# Patient Record
Sex: Male | Born: 1955
Health system: Southern US, Community
[De-identification: ages and names within clinical notes are randomized; demographics above are authoritative.]

## PROBLEM LIST (undated history)

## (undated) DIAGNOSIS — L719 Rosacea, unspecified: Secondary | ICD-10-CM

## (undated) DIAGNOSIS — I10 Essential (primary) hypertension: Secondary | ICD-10-CM

## (undated) DIAGNOSIS — N201 Calculus of ureter: Secondary | ICD-10-CM

## (undated) DIAGNOSIS — N200 Calculus of kidney: Secondary | ICD-10-CM

## (undated) DIAGNOSIS — Z87448 Personal history of other diseases of urinary system: Secondary | ICD-10-CM

## (undated) DIAGNOSIS — Z87442 Personal history of urinary calculi: Secondary | ICD-10-CM

## (undated) DIAGNOSIS — K219 Gastro-esophageal reflux disease without esophagitis: Secondary | ICD-10-CM

## (undated) DIAGNOSIS — L659 Nonscarring hair loss, unspecified: Secondary | ICD-10-CM

## (undated) DIAGNOSIS — R319 Hematuria, unspecified: Secondary | ICD-10-CM

## (undated) DIAGNOSIS — C61 Malignant neoplasm of prostate: Secondary | ICD-10-CM

## (undated) DIAGNOSIS — M1990 Unspecified osteoarthritis, unspecified site: Secondary | ICD-10-CM

## (undated) HISTORY — PX: JOINT REPLACEMENT: SHX530

## (undated) HISTORY — PX: COLONOSCOPY: SHX174

---
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
## 1993-06-25 HISTORY — PX: KNEE ARTHROSCOPY: SHX127

## 2000-03-04 ENCOUNTER — Encounter (INDEPENDENT_AMBULATORY_CARE_PROVIDER_SITE_OTHER): Payer: Self-pay | Admitting: Specialist

## 2000-03-04 ENCOUNTER — Ambulatory Visit (HOSPITAL_BASED_OUTPATIENT_CLINIC_OR_DEPARTMENT_OTHER): Admission: RE | Admit: 2000-03-04 | Discharge: 2000-03-04 | Payer: Self-pay | Admitting: Plastic Surgery

## 2000-03-04 HISTORY — PX: LIPOMA EXCISION: SHX5283

## 2000-10-21 ENCOUNTER — Encounter: Payer: Self-pay | Admitting: Internal Medicine

## 2000-10-21 ENCOUNTER — Encounter: Admission: RE | Admit: 2000-10-21 | Discharge: 2000-10-21 | Payer: Self-pay | Admitting: Internal Medicine

## 2000-11-15 ENCOUNTER — Encounter: Payer: Self-pay | Admitting: Internal Medicine

## 2000-11-15 ENCOUNTER — Ambulatory Visit (HOSPITAL_COMMUNITY): Admission: RE | Admit: 2000-11-15 | Discharge: 2000-11-15 | Payer: Self-pay | Admitting: Internal Medicine

## 2000-11-26 ENCOUNTER — Encounter (HOSPITAL_COMMUNITY): Admission: RE | Admit: 2000-11-26 | Discharge: 2001-01-22 | Payer: Self-pay | Admitting: Internal Medicine

## 2000-12-13 ENCOUNTER — Ambulatory Visit (HOSPITAL_COMMUNITY): Admission: RE | Admit: 2000-12-13 | Discharge: 2000-12-13 | Payer: Self-pay | Admitting: Internal Medicine

## 2000-12-13 ENCOUNTER — Encounter (INDEPENDENT_AMBULATORY_CARE_PROVIDER_SITE_OTHER): Payer: Self-pay | Admitting: *Deleted

## 2000-12-13 ENCOUNTER — Encounter: Payer: Self-pay | Admitting: Internal Medicine

## 2001-01-17 ENCOUNTER — Ambulatory Visit (HOSPITAL_COMMUNITY): Admission: RE | Admit: 2001-01-17 | Discharge: 2001-01-17 | Payer: Self-pay | Admitting: Internal Medicine

## 2001-01-17 ENCOUNTER — Encounter (INDEPENDENT_AMBULATORY_CARE_PROVIDER_SITE_OTHER): Payer: Self-pay | Admitting: Specialist

## 2001-01-17 ENCOUNTER — Encounter: Payer: Self-pay | Admitting: Internal Medicine

## 2001-01-24 ENCOUNTER — Encounter (HOSPITAL_COMMUNITY): Admission: RE | Admit: 2001-01-24 | Discharge: 2001-04-24 | Payer: Self-pay | Admitting: Internal Medicine

## 2001-04-25 ENCOUNTER — Encounter (HOSPITAL_COMMUNITY): Admission: RE | Admit: 2001-04-25 | Discharge: 2001-07-24 | Payer: Self-pay | Admitting: Internal Medicine

## 2001-08-22 ENCOUNTER — Encounter (HOSPITAL_COMMUNITY): Admission: RE | Admit: 2001-08-22 | Discharge: 2001-11-20 | Payer: Self-pay | Admitting: Internal Medicine

## 2001-08-29 ENCOUNTER — Encounter: Payer: Self-pay | Admitting: Internal Medicine

## 2001-08-29 ENCOUNTER — Ambulatory Visit (HOSPITAL_COMMUNITY): Admission: RE | Admit: 2001-08-29 | Discharge: 2001-08-29 | Payer: Self-pay | Admitting: Internal Medicine

## 2003-02-26 ENCOUNTER — Encounter (HOSPITAL_COMMUNITY): Admission: RE | Admit: 2003-02-26 | Discharge: 2003-05-27 | Payer: Self-pay | Admitting: Internal Medicine

## 2003-06-21 ENCOUNTER — Encounter (HOSPITAL_COMMUNITY): Admission: RE | Admit: 2003-06-21 | Discharge: 2003-09-19 | Payer: Self-pay | Admitting: Internal Medicine

## 2003-09-24 ENCOUNTER — Encounter (HOSPITAL_COMMUNITY): Admission: RE | Admit: 2003-09-24 | Discharge: 2003-12-23 | Payer: Self-pay | Admitting: Internal Medicine

## 2004-04-21 ENCOUNTER — Encounter (HOSPITAL_COMMUNITY): Admission: RE | Admit: 2004-04-21 | Discharge: 2004-07-20 | Payer: Self-pay | Admitting: Nephrology

## 2005-01-03 ENCOUNTER — Encounter: Admission: RE | Admit: 2005-01-03 | Discharge: 2005-01-03 | Payer: Self-pay | Admitting: Internal Medicine

## 2005-01-14 ENCOUNTER — Emergency Department (HOSPITAL_COMMUNITY): Admission: EM | Admit: 2005-01-14 | Discharge: 2005-01-14 | Payer: Self-pay | Admitting: Emergency Medicine

## 2005-06-28 ENCOUNTER — Encounter (HOSPITAL_COMMUNITY): Admission: RE | Admit: 2005-06-28 | Discharge: 2005-09-26 | Payer: Self-pay | Admitting: Internal Medicine

## 2005-08-02 ENCOUNTER — Ambulatory Visit: Payer: Self-pay | Admitting: Pulmonary Disease

## 2005-09-25 ENCOUNTER — Ambulatory Visit: Payer: Self-pay | Admitting: Pulmonary Disease

## 2005-10-03 ENCOUNTER — Encounter (HOSPITAL_COMMUNITY): Admission: RE | Admit: 2005-10-03 | Discharge: 2006-01-01 | Payer: Self-pay | Admitting: Internal Medicine

## 2006-02-28 ENCOUNTER — Encounter (HOSPITAL_COMMUNITY): Admission: RE | Admit: 2006-02-28 | Discharge: 2006-05-29 | Payer: Self-pay | Admitting: Internal Medicine

## 2006-06-05 ENCOUNTER — Encounter (HOSPITAL_COMMUNITY): Admission: RE | Admit: 2006-06-05 | Discharge: 2006-06-05 | Payer: Self-pay | Admitting: Internal Medicine

## 2006-10-11 ENCOUNTER — Encounter (HOSPITAL_COMMUNITY): Admission: RE | Admit: 2006-10-11 | Discharge: 2006-12-26 | Payer: Self-pay | Admitting: Internal Medicine

## 2007-02-14 ENCOUNTER — Encounter (HOSPITAL_COMMUNITY): Admission: RE | Admit: 2007-02-14 | Discharge: 2007-04-30 | Payer: Self-pay | Admitting: Internal Medicine

## 2007-08-11 ENCOUNTER — Encounter (HOSPITAL_COMMUNITY): Admission: RE | Admit: 2007-08-11 | Discharge: 2007-11-09 | Payer: Self-pay | Admitting: Internal Medicine

## 2008-02-10 ENCOUNTER — Ambulatory Visit (HOSPITAL_COMMUNITY): Admission: RE | Admit: 2008-02-10 | Discharge: 2008-02-10 | Payer: Self-pay | Admitting: Urology

## 2008-03-17 ENCOUNTER — Encounter (INDEPENDENT_AMBULATORY_CARE_PROVIDER_SITE_OTHER): Payer: Self-pay | Admitting: Urology

## 2008-03-17 ENCOUNTER — Inpatient Hospital Stay (HOSPITAL_COMMUNITY): Admission: RE | Admit: 2008-03-17 | Discharge: 2008-03-18 | Payer: Self-pay | Admitting: Urology

## 2008-03-17 HISTORY — PX: ROBOT ASSISTED LAPAROSCOPIC RADICAL PROSTATECTOMY: SHX5141

## 2008-03-19 ENCOUNTER — Inpatient Hospital Stay (HOSPITAL_COMMUNITY): Admission: EM | Admit: 2008-03-19 | Discharge: 2008-03-22 | Payer: Self-pay | Admitting: Urology

## 2008-04-22 ENCOUNTER — Encounter (HOSPITAL_COMMUNITY): Admission: RE | Admit: 2008-04-22 | Discharge: 2008-06-24 | Payer: Self-pay | Admitting: Internal Medicine

## 2008-08-27 ENCOUNTER — Encounter (HOSPITAL_COMMUNITY): Admission: RE | Admit: 2008-08-27 | Discharge: 2008-11-25 | Payer: Self-pay | Admitting: Internal Medicine

## 2009-03-26 ENCOUNTER — Ambulatory Visit (HOSPITAL_COMMUNITY): Admission: EM | Admit: 2009-03-26 | Discharge: 2009-03-26 | Payer: Self-pay | Admitting: Emergency Medicine

## 2009-03-26 HISTORY — PX: COMPLEX WOUND CLOSURE: SHX6446

## 2009-10-05 ENCOUNTER — Ambulatory Visit (HOSPITAL_BASED_OUTPATIENT_CLINIC_OR_DEPARTMENT_OTHER): Admission: RE | Admit: 2009-10-05 | Discharge: 2009-10-05 | Payer: Self-pay | Admitting: Urology

## 2009-10-05 HISTORY — PX: OTHER SURGICAL HISTORY: SHX169

## 2009-11-11 ENCOUNTER — Ambulatory Visit (HOSPITAL_COMMUNITY): Admission: RE | Admit: 2009-11-11 | Discharge: 2009-11-11 | Payer: Self-pay | Admitting: Internal Medicine

## 2010-09-13 LAB — POCT I-STAT, CHEM 8
Creatinine, Ser: 0.9 mg/dL (ref 0.4–1.5)
Glucose, Bld: 101 mg/dL — ABNORMAL HIGH (ref 70–99)
HCT: 46 % (ref 39.0–52.0)
Hemoglobin: 15.6 g/dL (ref 13.0–17.0)
Sodium: 141 mEq/L (ref 135–145)
TCO2: 29 mmol/L (ref 0–100)

## 2010-09-28 LAB — POCT I-STAT, CHEM 8
BUN: 23 mg/dL (ref 6–23)
Calcium, Ion: 1.07 mmol/L — ABNORMAL LOW (ref 1.12–1.32)
Hemoglobin: 15 g/dL (ref 13.0–17.0)
Sodium: 140 mEq/L (ref 135–145)

## 2010-10-05 LAB — FERRITIN: Ferritin: 9 ng/mL — ABNORMAL LOW (ref 22–322)

## 2010-11-07 NOTE — Discharge Summary (Signed)
NAME:  Randall Bradley, Randall Bradley NO.:  [id number]   MEDICAL RECORD NO.:  [id number]          PATIENT TYPE:  INP   LOCATION:  1336                         FACILITY:  [facility]   PHYSICIAN:  Heloise Purpura, MD      DATE OF BIRTH:  Aug 19, 1955   DATE OF ADMISSION:  03/19/2008  DATE OF DISCHARGE:  03/22/2008                               DISCHARGE SUMMARY   ADMISSION DIAGNOSES:  1. Prostate cancer status post robotic prostatectomy.  2. Abdominal pain with nausea and vomiting suggestive of a      postoperative ileus.   DISCHARGE DIAGNOSES:  1. Prostate cancer status post robotic prostatectomy.  2. Postoperative ileus.   HISTORY AND PHYSICAL:  For full details, please see admission history  and physical.  Briefly, Randall Bradley is a 55 year old gentleman who  underwent a robotic prostatectomy on March 17, 2008.  He recovered  well over the next 24 hours and was able to be discharged home on  postoperative day #1 after tolerating a clear liquid diet without  difficulty.  He subsequently developed severe nausea and vomiting over  the next 48 hours and became significantly dehydrated.  He was evaluated  in our office and appeared to have abdominal distention and tenderness  and findings consistent most likely with postoperative ileus.  It was  therefore decided to admit him to the hospital for further evaluation  and for IV fluid hydration.   HOSPITAL COURSE:  On March 19, 2008, the patient was admitted to the  hospital and placed on IV fluid hydration.  No acute abdominal series  revealed severe distention of the small and large intestine with air  seen down into the rectum suggestive of a pattern consistent with an  ileus.  No free air was present to suggest bowel injury, nor were  findings suggestive of a mal obstruction.  The patient was managed with  a nasogastric tube which significantly relieved his discomfort.  He was  then monitored overnight and did have increased  urine output and felt  much improved the following day.  His nasogastric tube was clamped and  an abdominal film was obtained which demonstrated decreased distention  of the small intestine and colon.  His nasogastric tube was then  removed.  He remained n.p.o. that day and the following day was begun on  a clear liquid diet as he did have return of bowel function and began  passing flatus and having multiple bowel movements.  On March 22, 2008, he was advanced to a regular diet which he tolerated without  difficulty.  He continued to have bowel function without nausea or  vomiting and was felt stable to be discharged home.   DISPOSITION:  Home.   DISCHARGE MEDICATIONS:  He was instructed to resume his regular home  medications including Vicodin as needed for pain and was instructed to  take Colace as a stool softener.   DISCHARGE INSTRUCTIONS:  He was instructed to be ambulatory but  specifically told to refrain from any heavy lifting, strenuous activity,  or driving.  He will continue to be managed with  an indwelling Foley  catheter and again was instructed on proper use.   FOLLOW UP:  He will follow-up in 2 days for further evaluation and  removal of his Foley catheter.   PATHOLOGY:  His surgical pathology did return during his hospitalization  and demonstrated a [id number] Gleason 3+4=7 adenocarcinoma with negative  surgical margins.  This pathology report and its implications were  discussed with the patient during his hospitalization.      Heloise Purpura, MD  Electronically Signed     LB/MEDQ  D:  03/22/2008  T:  03/23/2008  Job:  [id number]

## 2010-11-07 NOTE — Op Note (Signed)
NAME:  Randall Bradley, Randall Bradley NO.:  [id number]   MEDICAL RECORD NO.:  [id number]          PATIENT TYPE:  INP   LOCATION:  1403                         FACILITY:  [facility]   PHYSICIAN:  Heloise Purpura, MD      DATE OF BIRTH:  1955-08-29   DATE OF PROCEDURE:  03/17/2008  DATE OF DISCHARGE:                               OPERATIVE REPORT   PREOPERATIVE DIAGNOSIS:  Clinically localized adenocarcinoma of the  prostate (clinical stage T1C N0 M0).   POSTOPERATIVE DIAGNOSIS:  Clinically localized adenocarcinoma of the  prostate (clinical stage T1C N0 M0).   PROCEDURES:  1. Robotic assisted laparoscopic radical prostatectomy (non nerve      sparing).  2. Bilateral extended laparoscopic pelvic lymphadenectomy.   SURGEON:  Heloise Purpura, MD   ASSISTANT:  Delia Chimes, NP   ANESTHESIA:  General.   COMPLICATIONS:  None.   ESTIMATED BLOOD LOSS:  200 mL.   INTRAVENOUS FLUIDS:  2200 mL of lactated Ringer's.   SPECIMENS:  1. Prostate seminal vesicles.  2. Right pelvic lymph nodes.  3. Left pelvic lymph nodes.   DISPOSITION OF SPECIMENS:  To pathology.   DRAINS:  1. A 20 French coude catheter.  2. A #19 Blake pelvic drain.   INDICATIONS:  Mr. Kossman is a 55 year old gentleman with clinically  localized adenocarcinoma of the prostate.  He was found to have high-  risk disease and after discussion regarding various management options  for treatment he elected to proceed with surgical therapy as primary  treatment.  The potential risks, complications and alternative treatment  options were discussed in detail and informed consent was obtained.   DESCRIPTION OF PROCEDURE:  The patient was taken to the operating room  and a general anesthetic was administered.  He was given preoperative  antibiotics, placed in the dorsal lithotomy position, prepped and draped  in the usual sterile fashion.  Next a preoperative time-out was  performed.  A site was selected just superior to  the umbilicus after a  Foley catheter was placed.  This was used to place the camera port after  a small incision was made in the midline allowing entry into the  peritoneal cavity utilizing an open Hasson approach.  A 12 mm port was  then placed and a pneumoperitoneum was established.  A zero degree lens  was used to inspect the abdomen and there was no evidence for any intra-  abdominal injuries or other abnormalities.  The remaining ports were  then placed.  Bilateral 8 mm robotic ports were placed 10 cm lateral to  and just inferior to the camera port site.  An additional 8 mm port was  placed in the far left lateral abdominal wall.  A 5 mm port was placed  between the camera port and the right robotic port.  An additional 12 mm  port was placed in the far right lateral abdominal wall for laparoscopic  assistance.  All ports were placed under direct vision and without  difficulty.  The surgical cart was then docked.  With the aid of the  cautery scissors the bladder  was reflected posteriorly allowing entry  into space of Retzius and identification of the endopelvic fascia and  prostate.  The endopelvic fascia was then incised from the apex back to  the base of the prostate bilaterally and the underlying levator muscle  fibers were swept laterally off the prostate thereby isolating the  dorsal venous complex.  The dorsal venous complex was then stapled and  divided with a 45 mm flex ETS stapler.  Attention then turned to the  bladder neck which was identified with the aid of Foley catheter  manipulation.  The bladder neck was divided anteriorly allowing exposure  of the Foley catheter.  The catheter balloon was deflated and the  catheter was brought into the operative field and used to retract the  prostate anteriorly.  This exposed the posterior bladder neck which was  then divided and dissection proceeded between the bladder and prostate  until the vasa deferentia and seminal vesicles  were identified.  The  vasa deferentia were isolated, divided and lifted anteriorly.  The  seminal vesicles were then dissected down to their tips with care to  control the seminal vesicle arterial blood supply.  The seminal vesicles  and vasa deferentia were then lifted anteriorly in the space between  Denonvilliers fascia and the anterior rectum was bluntly developed  thereby isolating the vascular pedicles of the prostate.  Based on the  patient's high-risk disease, it was decided to proceed with a wide non  nerve sparing dissection of the pedicles of the prostate.  Hem-o-lok  clips were used for hemostasis and the pedicles were divided with  scissors dissection.  The urethra was then sharply divided allowing the  prostate specimen to be disarticulated and the pelvis was copiously  irrigated.  Hemostasis appeared excellent and with irrigation in the  pelvis, air was injected into the rectal catheter and there was no  evidence for a rectal injury.  Attention then turned to the right pelvic  sidewall.   Based on the patient's high-risk disease, it was decided to proceed with  an extended lymphadenectomy.  The fibrofatty tissue extending from just  lateral to the external iliac artery to a point just above the  confluence of the iliac vessels, posteriorly to the hypogastric artery  and distally to Cooper's ligament were dissected free off the pelvic  sidewall with Hem-o-lok clips used for lymphostasis and hemostasis.  Care was taken to preserve the obturator nerve.  An identical procedure  was then performed on the contralateral side.  Each lymph node packet  was passed off for permanent pathologic analysis.  No grossly abnormal  lymph nodes were identified.  Attention then returned to the pelvis.  A  2-0 Vicryl slip-knot was placed between Denonvilliers fascia, the  posterior bladder neck and posterior urethra to reapproximate these  structures.  A double-armed 3-0 Monocryl suture was  then used to perform  a 360 degree running tension-free anastomosis between the bladder neck  and urethra.  A new 20 [place] coude catheter was inserted into the  bladder and irrigated.  The anastomosis appeared to be watertight and  there were no blood clots within the bladder.  A #19 Blake drain was  then brought through the left robotic port and appropriately positioned  in the pelvis.  It was secured to the skin with a nylon suture.  The  surgical cart was then undocked.  The right lateral 12 mm port site was  closed with zero Vicryl suture with the aid of  the [employer]  needle.  All remaining ports were then removed under direct vision and  the prostate specimen was removed intact within the Endopouch retrieval  bag via the periumbilical port site.  This port site was then closed at  the fascial level with a running zero Vicryl suture.  Quarter percent  Marcaine was then used to inject all port sites which were  reapproximated at the skin level with staples.  Sterile dressings were  applied.  The patient appeared to tolerate the procedure well without  complications.  He was able to be extubated and transferred to the  recovery unit in satisfactory condition.      Heloise Purpura, MD  Electronically Signed     LB/MEDQ  D:  03/17/2008  T:  03/18/2008  Job:  [id number]

## 2010-11-10 NOTE — Op Note (Signed)
[place]  Patient:    Randall Bradley, Randall Bradley                        MRN: [id number] Proc. Date: 03/04/00 Adm. Date:  [id number] Attending:  Loura Halt Ii                           Operative Report  PREOPERATIVE DIAGNOSIS:  1.5 cm right lower forehead sebaceous cyst.  POSTOPERATIVE DIAGNOSIS:  1.5 cm right lower forehead lipoma.  OPERATION PERFORMED:  Excision of subcutaneous mass, right lower forehead (lipoma removed).  SURGEON:  Alfredia Ferguson, M.D.  ANESTHESIA:  2% lidocaine with 1:100,000 epinephrine.  INDICATIONS FOR PROCEDURE:  The patient is a 55 year old male with a prominent forehead mass located in a subcutaneous position approximately 2 cm above the medial right eyebrow.  The patient wishes to have this area removed.  He states that periodically it gets larger and then seems to get smaller.  He understands that he will be trading what he has for a permanent potentially unsightly scar.  There is always a risk of a contour deformity.  In spite of these risks, the patient wishes to proceed with the operation.  DESCRIPTION OF PROCEDURE:  Dimensions of the lesion were marked on the skin and an elliptical skin marker was placed in the planned location of the incision.  Local anesthesia was infiltrated in a field block.  The patients forehead was prepped and draped in sterile fashion. After waiting approximately 10 minutes, an elliptical skin incision was made approximately 2 mm wide.  The ellipse of skin was removed.  The dissection was deepened into the subcutaneous space and no sebaceous cyst was discovered.  The mass was felt to be beneath the frontalis muscle.  The frontalis muscle was opened and just beneath the frontalis muscle, a 1.5 cm lipoma was visualized.  This was densely adherent to the periosteum of the frontal bone.  Using a combination of sharp dissection and electrocautery dissection, the lipoma was removed.  It was  removed in whole.  Specimen was passed off for pathology.  Hemostasis was accomplished using electrocautery.  The wound was closed by reapproximating the frontalis muscle using interrupted 4-0 Vicryl suture.  The dermis was closed with a similar suture.  The skin was united using a running 6-0 nylon suture.  A light dressing was applied and the patient was discharged home in satisfactory condition. DD:  03/04/00 TD:  03/04/00 Job: [id number]
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
## 2011-01-10 ENCOUNTER — Ambulatory Visit (HOSPITAL_COMMUNITY): Payer: BC Managed Care – PPO | Attending: Internal Medicine

## 2011-02-07 ENCOUNTER — Ambulatory Visit (HOSPITAL_COMMUNITY): Payer: BC Managed Care – PPO | Attending: Internal Medicine

## 2011-02-07 ENCOUNTER — Other Ambulatory Visit: Payer: Self-pay | Admitting: Internal Medicine

## 2011-02-08 LAB — POCT HEMOGLOBIN-HEMACUE: Hemoglobin: 14.5 g/dL (ref 13.0–17.0)

## 2011-03-07 ENCOUNTER — Encounter (HOSPITAL_COMMUNITY): Payer: BC Managed Care – PPO | Attending: Internal Medicine

## 2011-03-07 ENCOUNTER — Other Ambulatory Visit: Payer: Self-pay | Admitting: Internal Medicine

## 2011-03-07 LAB — FERRITIN: Ferritin: 20 ng/mL — ABNORMAL LOW (ref 22–322)

## 2011-03-16 LAB — FERRITIN: Ferritin: 17 — ABNORMAL LOW (ref 22–322)

## 2011-03-21 LAB — FERRITIN: Ferritin: 13 — ABNORMAL LOW (ref 22–322)

## 2011-03-21 LAB — HEMOGLOBIN AND HEMATOCRIT, BLOOD: Hemoglobin: 13.8

## 2011-03-26 LAB — HEMOGLOBIN AND HEMATOCRIT, BLOOD
HCT: 38.8 — ABNORMAL LOW
Hemoglobin: 11.1 — ABNORMAL LOW
Hemoglobin: 12.5 — ABNORMAL LOW

## 2011-03-26 LAB — BASIC METABOLIC PANEL
BUN: 12
CO2: 28
Calcium: 8.1 — ABNORMAL LOW
Calcium: 8.5
Calcium: 9
Chloride: 102
Chloride: 103
Chloride: 106
Creatinine, Ser: 0.94
Creatinine, Ser: 0.95
Creatinine, Ser: 1.03
GFR calc non Af Amer: >60
GFR calc non Af Amer: >60
GFR calc non Af Amer: >60
Glucose, Bld: 111 — ABNORMAL HIGH
Glucose, Bld: 143 — ABNORMAL HIGH
Potassium: 3.7
Potassium: 3.9
Sodium: 138
Sodium: 140

## 2011-03-26 LAB — CBC
HCT: 33.3 — ABNORMAL LOW
HCT: 36.6 — ABNORMAL LOW
Hemoglobin: 10.9 — ABNORMAL LOW
Hemoglobin: 13.4
MCHC: 32.4
MCHC: 32.4
MCHC: 32.8
MCV: 86.4
Platelets: 162
RBC: 3.85 — ABNORMAL LOW
RBC: 4.23
RBC: 4.76
RDW: 14.1
RDW: 14.4
WBC: 11.3 — ABNORMAL HIGH
WBC: 5
WBC: 6.8

## 2011-03-26 LAB — DIFFERENTIAL
Basophils Relative: 0
Basophils Relative: 0
Eosinophils Absolute: 0
Eosinophils Relative: 0
Lymphocytes Relative: 19
Lymphs Abs: 0.5 — ABNORMAL LOW
Lymphs Abs: 1.3
Monocytes Relative: 8
Monocytes Relative: 9
Neutro Abs: 4.8
Neutrophils Relative %: 71

## 2011-03-26 LAB — TYPE AND SCREEN: Antibody Screen: NEGATIVE

## 2011-04-04 ENCOUNTER — Other Ambulatory Visit: Payer: Self-pay | Admitting: Internal Medicine

## 2011-04-04 ENCOUNTER — Encounter (HOSPITAL_COMMUNITY)
Admission: RE | Admit: 2011-04-04 | Discharge: 2011-04-04 | Disposition: A | Discharge status: Home / Self Care | Payer: BC Managed Care – PPO | Source: Ambulatory Visit | Attending: Internal Medicine | Admitting: Internal Medicine

## 2011-04-06 LAB — IRON AND TIBC
Iron: 161 — ABNORMAL HIGH
Saturation Ratios: 59 — ABNORMAL HIGH
TIBC: 275
UIBC: 114

## 2011-04-06 LAB — FERRITIN: Ferritin: 15 — ABNORMAL LOW (ref 22–322)

## 2011-04-27 ENCOUNTER — Other Ambulatory Visit (HOSPITAL_COMMUNITY): Payer: Self-pay | Admitting: *Deleted

## 2011-05-03 ENCOUNTER — Encounter (HOSPITAL_COMMUNITY): Payer: BC Managed Care – PPO

## 2011-05-14 ENCOUNTER — Inpatient Hospital Stay (HOSPITAL_COMMUNITY): Admission: RE | Admit: 2011-05-14 | Payer: BC Managed Care – PPO | Source: Ambulatory Visit

## 2011-07-13 ENCOUNTER — Encounter (HOSPITAL_COMMUNITY): Payer: BC Managed Care – PPO

## 2011-07-20 ENCOUNTER — Encounter (HOSPITAL_COMMUNITY): Payer: BC Managed Care – PPO

## 2011-07-25 ENCOUNTER — Encounter (HOSPITAL_COMMUNITY)
Admission: RE | Admit: 2011-07-25 | Discharge: 2011-07-25 | Disposition: A | Discharge status: Home / Self Care | Payer: BC Managed Care – PPO | Source: Ambulatory Visit | Attending: Internal Medicine | Admitting: Internal Medicine

## 2012-08-08 ENCOUNTER — Encounter: Payer: BC Managed Care – PPO | Admitting: Internal Medicine

## 2012-08-15 ENCOUNTER — Ambulatory Visit (INDEPENDENT_AMBULATORY_CARE_PROVIDER_SITE_OTHER): Payer: BC Managed Care – PPO | Admitting: Internal Medicine

## 2012-08-15 DIAGNOSIS — Z0000 Encounter for general adult medical examination without abnormal findings: Secondary | ICD-10-CM

## 2012-08-15 DIAGNOSIS — Z789 Other specified health status: Secondary | ICD-10-CM

## 2012-08-15 DIAGNOSIS — Z23 Encounter for immunization: Secondary | ICD-10-CM

## 2012-08-15 MED ORDER — ATOVAQUONE-PROGUANIL HCL 250-100 MG PO TABS: 4.0000 | ORAL_TABLET | Freq: Every day | ORAL | Status: DC

## 2012-08-15 MED ORDER — CIPROFLOXACIN HCL 500 MG PO TABS: 500.0000 mg | ORAL_TABLET | Freq: Two times a day (BID) | ORAL | Status: DC

## 2012-08-15 MED ORDER — MEFLOQUINE HCL 250 MG PO TABS: 250.0000 mg | ORAL_TABLET | ORAL | Status: DC

## 2012-08-15 MED ORDER — ZOLPIDEM TARTRATE 10 MG PO TABS: 10.0000 mg | ORAL_TABLET | Freq: Every evening | ORAL | Status: DC | PRN

## 2012-08-15 NOTE — Progress Notes (Signed)
[facility]  RFV: preparation for [place] 5 wk missionary trip Subjective:    Patient ID: Randall Bradley, male    DOB: [birth year], 57 y.o.   MRN: [id number]  HPI Randall Bradley is 57yo Male who is planning on taking a 5 wk trip to [place], near [place] and then travel by boat through [place]. Missionary trip, working water wells, schools. Going to remote areas of [place]. He will be traveling from April 21-May 27th  Previously traveled to [place] x 2; [place], [place], [place], [place]. Received flu vax last year.he previously took larium for malaria proph without difficulty  Pmhx: ? Possibly had hep A from seafood?  Meds: asa Fhx: CAD    Review of Systems     Objective:   Physical Exam        Assessment & Plan:  Pre-travel vaccinations = typhoid injection, yellow fever, hep A, hep B  Malaria proph = will give him #12 of larium but also treatment dose for malarone incase he has malaria, as diagnosed by a clinician. recs for mosquito deterents  Traveler's diarrhea = gave recs plus rx for cipro  Jet-lag = gave rx for 7 tabs of ambien  rtc in 1 month for hep B #2, and in 6 months for hep A #2, hep B #3

## 2012-09-15 ENCOUNTER — Ambulatory Visit (INDEPENDENT_AMBULATORY_CARE_PROVIDER_SITE_OTHER): Payer: BC Managed Care – PPO | Admitting: *Deleted

## 2012-09-15 DIAGNOSIS — Z0000 Encounter for general adult medical examination without abnormal findings: Secondary | ICD-10-CM

## 2012-09-15 DIAGNOSIS — Z23 Encounter for immunization: Secondary | ICD-10-CM

## 2013-02-16 ENCOUNTER — Ambulatory Visit: Payer: BC Managed Care – PPO

## 2013-03-06 ENCOUNTER — Other Ambulatory Visit: Payer: Self-pay | Admitting: Dermatology

## 2013-05-27 ENCOUNTER — Other Ambulatory Visit: Payer: Self-pay | Admitting: Internal Medicine

## 2013-05-27 ENCOUNTER — Ambulatory Visit
Admission: RE | Admit: 2013-05-27 | Discharge: 2013-05-27 | Disposition: A | Discharge status: Home / Self Care | Payer: BC Managed Care – PPO | Source: Ambulatory Visit | Attending: Internal Medicine | Admitting: Internal Medicine

## 2013-05-27 DIAGNOSIS — R079 Chest pain, unspecified: Secondary | ICD-10-CM

## 2014-10-08 ENCOUNTER — Other Ambulatory Visit: Payer: Self-pay | Admitting: Urology

## 2014-10-08 ENCOUNTER — Encounter (HOSPITAL_BASED_OUTPATIENT_CLINIC_OR_DEPARTMENT_OTHER): Payer: Self-pay | Admitting: *Deleted

## 2014-10-08 NOTE — Progress Notes (Signed)
NPO AFTER MN. ARRIVE AT 0800. NEEDS HG.

## 2014-10-11 ENCOUNTER — Encounter (HOSPITAL_BASED_OUTPATIENT_CLINIC_OR_DEPARTMENT_OTHER): Payer: Self-pay

## 2014-10-11 ENCOUNTER — Ambulatory Visit (HOSPITAL_BASED_OUTPATIENT_CLINIC_OR_DEPARTMENT_OTHER)
Admission: RE | Admit: 2014-10-11 | Discharge: 2014-10-11 | Disposition: A | Discharge status: Home / Self Care | Payer: BLUE CROSS/BLUE SHIELD | Source: Ambulatory Visit | Attending: Urology | Admitting: Urology

## 2014-10-11 ENCOUNTER — Ambulatory Visit (HOSPITAL_BASED_OUTPATIENT_CLINIC_OR_DEPARTMENT_OTHER): Payer: BLUE CROSS/BLUE SHIELD | Admitting: Anesthesiology

## 2014-10-11 ENCOUNTER — Encounter (HOSPITAL_BASED_OUTPATIENT_CLINIC_OR_DEPARTMENT_OTHER)
Admission: RE | Disposition: A | Discharge status: Home / Self Care | Payer: Self-pay | Source: Ambulatory Visit | Attending: Urology

## 2014-10-11 DIAGNOSIS — Z7982 Long term (current) use of aspirin: Secondary | ICD-10-CM | POA: Insufficient documentation

## 2014-10-11 DIAGNOSIS — N201 Calculus of ureter: Secondary | ICD-10-CM | POA: Insufficient documentation

## 2014-10-11 DIAGNOSIS — Z8546 Personal history of malignant neoplasm of prostate: Secondary | ICD-10-CM | POA: Diagnosis not present

## 2014-10-11 DIAGNOSIS — Z9049 Acquired absence of other specified parts of digestive tract: Secondary | ICD-10-CM | POA: Insufficient documentation

## 2014-10-11 HISTORY — DX: Personal history of other diseases of urinary system: Z87.448

## 2014-10-11 HISTORY — DX: Hematuria, unspecified: R31.9

## 2014-10-11 HISTORY — DX: Hereditary hemochromatosis: E83.110

## 2014-10-11 HISTORY — DX: Nonscarring hair loss, unspecified: L65.9

## 2014-10-11 HISTORY — DX: Calculus of ureter: N20.1

## 2014-10-11 HISTORY — DX: Rosacea, unspecified: L71.9

## 2014-10-11 HISTORY — PX: HOLMIUM LASER APPLICATION: SHX5852

## 2014-10-11 HISTORY — PX: CYSTOSCOPY W/ RETROGRADES: SHX1426

## 2014-10-11 LAB — POCT HEMOGLOBIN-HEMACUE: Hemoglobin: 10.4 g/dL — ABNORMAL LOW (ref 13.0–17.0)

## 2014-10-11 SURGERY — URETEROSCOPY, WITH LITHOTRIPSY USING HOLMIUM LASER
Anesthesia: General | Site: Bladder | Laterality: Right

## 2014-10-11 MED ORDER — PROPOFOL 10 MG/ML IV BOLUS
INTRAVENOUS | Status: DC | PRN
  Administered 2014-10-11: 200 mg via INTRAVENOUS

## 2014-10-11 MED ORDER — MIDAZOLAM HCL 2 MG/2ML IJ SOLN
INTRAMUSCULAR | Status: AC
  Filled 2014-10-11: qty 2

## 2014-10-11 MED ORDER — PHENYLEPHRINE HCL 10 MG/ML IJ SOLN
INTRAMUSCULAR | Status: DC | PRN
  Administered 2014-10-11 (×2): 120 ug via INTRAVENOUS
  Administered 2014-10-11 (×2): 80 ug via INTRAVENOUS

## 2014-10-11 MED ORDER — OXYCODONE-ACETAMINOPHEN 5-325 MG PO TABS: 1.0000 | ORAL_TABLET | ORAL | Status: DC | PRN

## 2014-10-11 MED ORDER — LACTATED RINGERS IV SOLN
INTRAVENOUS | Status: DC
  Administered 2014-10-11 (×2): via INTRAVENOUS
  Filled 2014-10-11: qty 1000

## 2014-10-11 MED ORDER — GLYCOPYRROLATE 0.2 MG/ML IJ SOLN
INTRAMUSCULAR | Status: DC | PRN
  Administered 2014-10-11: 0.4 mg via INTRAVENOUS

## 2014-10-11 MED ORDER — FENTANYL CITRATE (PF) 100 MCG/2ML IJ SOLN
INTRAMUSCULAR | Status: AC
  Filled 2014-10-11: qty 4

## 2014-10-11 MED ORDER — CEFAZOLIN SODIUM-DEXTROSE 2-3 GM-% IV SOLR
INTRAVENOUS | Status: AC
  Filled 2014-10-11: qty 50

## 2014-10-11 MED ORDER — LIDOCAINE HCL (CARDIAC) 20 MG/ML IV SOLN
INTRAVENOUS | Status: DC | PRN
  Administered 2014-10-11: 80 mg via INTRAVENOUS

## 2014-10-11 MED ORDER — CEFAZOLIN SODIUM-DEXTROSE 2-3 GM-% IV SOLR
2.0000 g | INTRAVENOUS | Status: AC
  Administered 2014-10-11: 2 g via INTRAVENOUS
  Filled 2014-10-11: qty 50

## 2014-10-11 MED ORDER — FENTANYL CITRATE (PF) 100 MCG/2ML IJ SOLN
25.0000 ug | INTRAMUSCULAR | Status: DC | PRN
  Filled 2014-10-11: qty 1

## 2014-10-11 MED ORDER — ACETAMINOPHEN 10 MG/ML IV SOLN
INTRAVENOUS | Status: DC | PRN
  Administered 2014-10-11: 1000 mg via INTRAVENOUS

## 2014-10-11 MED ORDER — SODIUM CHLORIDE 0.9 % IR SOLN
Status: DC | PRN
  Administered 2014-10-11: 6000 mL via INTRAVESICAL

## 2014-10-11 MED ORDER — CEFAZOLIN SODIUM 1-5 GM-% IV SOLN
1.0000 g | INTRAVENOUS | Status: DC
  Filled 2014-10-11: qty 50

## 2014-10-11 MED ORDER — PROMETHAZINE HCL 25 MG/ML IJ SOLN
6.2500 mg | INTRAMUSCULAR | Status: DC | PRN
  Filled 2014-10-11: qty 1

## 2014-10-11 MED ORDER — DEXAMETHASONE SODIUM PHOSPHATE 4 MG/ML IJ SOLN
INTRAMUSCULAR | Status: DC | PRN
  Administered 2014-10-11: 10 mg via INTRAVENOUS

## 2014-10-11 MED ORDER — CEPHALEXIN 500 MG PO CAPS: 500.0000 mg | ORAL_CAPSULE | Freq: Two times a day (BID) | ORAL | Status: DC

## 2014-10-11 MED ORDER — ONDANSETRON HCL 4 MG/2ML IJ SOLN
INTRAMUSCULAR | Status: DC | PRN
  Administered 2014-10-11: 4 mg via INTRAVENOUS

## 2014-10-11 MED ORDER — KETOROLAC TROMETHAMINE 30 MG/ML IJ SOLN
30.0000 mg | Freq: Once | INTRAMUSCULAR | Status: DC | PRN
  Filled 2014-10-11: qty 1

## 2014-10-11 MED ORDER — MIDAZOLAM HCL 5 MG/5ML IJ SOLN
INTRAMUSCULAR | Status: DC | PRN
  Administered 2014-10-11: 2 mg via INTRAVENOUS

## 2014-10-11 MED ORDER — KETOROLAC TROMETHAMINE 30 MG/ML IJ SOLN
INTRAMUSCULAR | Status: DC | PRN
  Administered 2014-10-11: 30 mg via INTRAVENOUS

## 2014-10-11 MED ORDER — OXYBUTYNIN CHLORIDE 5 MG PO TABS: 5.0000 mg | ORAL_TABLET | Freq: Three times a day (TID) | ORAL | Status: DC | PRN

## 2014-10-11 MED ORDER — FENTANYL CITRATE (PF) 100 MCG/2ML IJ SOLN
INTRAMUSCULAR | Status: DC | PRN
  Administered 2014-10-11: 50 ug via INTRAVENOUS

## 2014-10-11 MED ORDER — IOTHALAMATE MEGLUMINE 17.2 % UR SOLN
URETHRAL | Status: DC | PRN
  Administered 2014-10-11: 10 mL via URETHRAL

## 2014-10-11 SURGICAL SUPPLY — 21 items
BAG DRAIN URO-CYSTO SKYTR STRL (DRAIN) ×3 IMPLANT
BAG DRN UROCATH (DRAIN) ×2
BASKET ZERO TIP NITINOL 2.4FR (BASKET) ×1 IMPLANT
BSKT STON RTRVL ZERO TP 2.4FR (BASKET) ×2
CANISTER SUCT LVC 12 LTR MEDI- (MISCELLANEOUS) ×3 IMPLANT
CATH INTERMIT  6FR 70CM (CATHETERS) ×1 IMPLANT
CLOTH BEACON ORANGE TIMEOUT ST (SAFETY) ×3 IMPLANT
FIBER LASER FLEXIVA 365 (UROLOGICAL SUPPLIES) ×1 IMPLANT
GLOVE BIO SURGEON STRL SZ 6.5 (GLOVE) ×1 IMPLANT
GLOVE BIO SURGEON STRL SZ8 (GLOVE) ×3 IMPLANT
GLOVE INDICATOR 6.5 STRL GRN (GLOVE) ×2 IMPLANT
GOWN STRL REUS W/ TWL LRG LVL3 (GOWN DISPOSABLE) ×2 IMPLANT
GOWN STRL REUS W/ TWL XL LVL3 (GOWN DISPOSABLE) ×2 IMPLANT
GOWN STRL REUS W/TWL LRG LVL3 (GOWN DISPOSABLE) ×3
GOWN STRL REUS W/TWL XL LVL3 (GOWN DISPOSABLE) ×3
GUIDEWIRE STR DUAL SENSOR (WIRE) ×1 IMPLANT
IV NS IRRIG 3000ML ARTHROMATIC (IV SOLUTION) ×3 IMPLANT
NS IRRIG 500ML POUR BTL (IV SOLUTION) ×1 IMPLANT
PACK CYSTO (CUSTOM PROCEDURE TRAY) ×3 IMPLANT
SHEATH ACCESS URETERAL 24CM (SHEATH) ×1 IMPLANT
STENT URET 6FRX24 CONTOUR (STENTS) ×1 IMPLANT

## 2014-10-11 NOTE — Anesthesia Preprocedure Evaluation (Signed)
Anesthesia Evaluation  Patient identified by MRN, date of birth, ID band Patient awake    Reviewed: Allergy & Precautions, NPO status , Patient's Chart, lab work & pertinent test results  Airway Mallampati: II  TM Distance: >3 FB Neck ROM: Full    Dental no notable dental hx.    Pulmonary neg pulmonary ROS, former smoker,  breath sounds clear to auscultation  Pulmonary exam normal       Cardiovascular negative cardio ROS  Rhythm:Regular Rate:Normal     Neuro/Psych negative neurological ROS  negative psych ROS   GI/Hepatic negative GI ROS, Neg liver ROS,   Endo/Other  negative endocrine ROS  Renal/GU negative Renal ROS  negative genitourinary   Musculoskeletal negative musculoskeletal ROS (+)   Abdominal   Peds negative pediatric ROS (+)  Hematology negative hematology ROS (+)   Anesthesia Other Findings   Reproductive/Obstetrics negative OB ROS                             Anesthesia Physical Anesthesia Plan  ASA: I  Anesthesia Plan: General   Post-op Pain Management:    Induction: Intravenous  Airway Management Planned: LMA  Additional Equipment:   Intra-op Plan:   Post-operative Plan:   Informed Consent: I have reviewed the patients History and Physical, chart, labs and discussed the procedure including the risks, benefits and alternatives for the proposed anesthesia with the patient or authorized representative who has indicated his/her understanding and acceptance.   Dental advisory given  Plan Discussed with: CRNA and Surgeon  Anesthesia Plan Comments:         Anesthesia Quick Evaluation

## 2014-10-11 NOTE — Anesthesia Procedure Notes (Signed)
Procedure Name: LMA Insertion Date/Time: 10/11/2014 9:40 AM Performed by: Bethena Roys T Pre-anesthesia Checklist: Patient identified, Emergency Drugs available, Suction available and Patient being monitored Patient Re-evaluated:Patient Re-evaluated prior to inductionOxygen Delivery Method: Circle System Utilized Preoxygenation: Pre-oxygenation with 100% oxygen Intubation Type: IV induction Ventilation: Mask ventilation without difficulty LMA: LMA inserted LMA Size: 5.0 Number of attempts: 1 Airway Equipment and Method: Bite block Placement Confirmation: positive ETCO2 Dental Injury: Teeth and Oropharynx as per pre-operative assessment

## 2014-10-11 NOTE — Transfer of Care (Signed)
Immediate Anesthesia Transfer of Care Note  Patient: Randall Bradley  Procedure(s) Performed: Procedure(s): URETEROSCOPY , EXTRACTION OF STONE/STONE BASKETRY (Right) HOLMIUM LASER LITHOTRIPSY,  (N/A) CYSTOSCOPY WITH RETROGRADE PYELOGRAM (Right)  Patient Location: PACU  Anesthesia Type:General  Level of Consciousness: sedated and responds to stimulation  Airway & Oxygen Therapy: Patient Spontanous Breathing and Patient connected to nasal cannula oxygen  Post-op Assessment: Report given to RN  Post vital signs: Reviewed and stable  Last Vitals:  Filed Vitals:   10/11/14 0818  BP: 130/91  Pulse: 77  Temp: 36.4 C  Resp: 20    Complications: No apparent anesthesia complications

## 2014-10-11 NOTE — H&P (Signed)
Urology History and Physical Exam  CC: Kidney stone  HPI: 59 year old male presents for ureteroscopic management of a right distal ureteral stone. He was recently found to have this on a CT scan performed for left sided stone symptoms while in [place] on [place] work approximately 8 months ago. U/A in the office revealed microscopic hematuria. He presents for management of this stone.  PMH: Past Medical History  Diagnosis Date  . History of bladder stone   . Right ureteral stone   . History of prostate cancer UROLOGIST-- DR Presli Fanguy    S/P  PROSTATECTOMY 03-17-2008  . Hereditary hemochromatosis monitored by PCP  dr [name] gates    history of phlebotomies -- last one 2014 (pt states body has normalized)  . Hematuria   . Rosacea   . Alopecia     PSH: Past Surgical History  Procedure Laterality Date  . Appendectomy  age 54  . Excision lipoma of forehead  03-04-2000  . Left knee surgery  1995  . Robot assisted laparoscopic radical prostatectomy  03-17-2008    w/  BILATERAL PELVIC LYMPHADENECTOMY (NON-NERVE SPARING)  . Left index finger complex wound laceration repair  03-26-2009  . Cystolitholapaxy and urethral dilation  10-05-2009    Allergies: No Known Allergies  Medications: No prescriptions prior to admission     Social History: History   Social History  . Marital Status: Single    Spouse Name: N/A  . Number of Children: N/A  . Years of Education: N/A   Occupational History  . Not on file.   Social History Main Topics  . Smoking status: Former Smoker -- 10 years    Types: Cigarettes    Quit date: 10/07/1993  . Smokeless tobacco: Never Used  . Alcohol Use: No  . Drug Use: No  . Sexual Activity: Not on file   Other Topics Concern  . Not on file   Social History Narrative  . No narrative on file    Family History: History reviewed. No pertinent family history.   ROS: Genitourinary, constitutional, skin, eye, otolaryngeal,  hematologic/lymphatic, cardiovascular, pulmonary, endocrine, musculoskeletal, gastrointestinal, neurological and psychiatric system(s) were reviewed and pertinent findings if present are noted and are otherwise negative.  Genitourinary: incontinence and erectile dysfunction.  Gastrointestinal: abdominal pain.               Physical Exam: @VITALS2 @ General: No acute distress.  Awake. Head:  Normocephalic.  Atraumatic. ENT:  EOMI.  Mucous membranes moist Neck:  Supple.  No lymphadenopathy. CV:  S1 present. S2 present. Regular rate. Pulmonary: Equal effort bilaterally.  Clear to auscultation bilaterally. Abdomen: Soft.  Nontender to palpation. WHSS lower midline w/o hernia. Skin:  Normal turgor.  No visible rash. Extremity: No gross deformity of bilateral upper extremities.  No gross deformity of                             lower extremities. Neurologic: Alert. Appropriate mood.  Penis:  Circumcised.  No lesions. Urethra: Orthotopic meatus. Scrotum: No lesions.  No ecchymosis.  No erythema. Testicles: Descended bilaterally.  No masses bilaterally. Epididymis: Palpable bilaterally. Nontender to palpation.  Studies:  No results for input(s): HGB, WBC, PLT in the last 72 hours.  No results for input(s): NA, K, CL, CO2, BUN, CREATININE, CALCIUM, GFRNONAA, GFRAA in the last 72 hours.  Invalid input(s): MAGNESIUM   No results for input(s): INR, APTT in the last 72 hours.  Invalid  input(s): PT   Invalid input(s): ABG    Assessment:  8 mm right distal ureteral calculus  Plan: Right ureteroscopic stone extraction w/ laser

## 2014-10-11 NOTE — Anesthesia Postprocedure Evaluation (Signed)
  Anesthesia Post-op Note  Patient: Randall Bradley  Procedure(s) Performed: Procedure(s) (LRB): URETEROSCOPY , EXTRACTION OF STONE/STONE BASKETRY (Right) HOLMIUM LASER LITHOTRIPSY,  (N/A) CYSTOSCOPY WITH RETROGRADE PYELOGRAM (Right)  Patient Location: PACU  Anesthesia Type: General  Level of Consciousness: awake and alert   Airway and Oxygen Therapy: Patient Spontanous Breathing  Post-op Pain: mild  Post-op Assessment: Post-op Vital signs reviewed, Patient's Cardiovascular Status Stable, Respiratory Function Stable, Patent Airway and No signs of Nausea or vomiting  Last Vitals:  Filed Vitals:   10/11/14 1032  BP: 118/80  Pulse: 85  Temp: 36.6 C  Resp: 11    Post-op Vital Signs: stable   Complications: No apparent anesthesia complications

## 2014-10-11 NOTE — Discharge Instructions (Signed)
POSTOPERATIVE CARE AFTER URETEROSCOPY  Stent management  *Stents are often left in after ureteroscopy and stone treatment. If left in, they often cause urinary frequency, urgency, occasional blood in the urine, as well as flank discomfort with urination. These are all expected issues, and should resolve after the stent is removed. *Often times, a small thread is left on the end of the stent, and brought out through the urethra. If so, this is used to remove the stent, making it unnecessary to look in the bladder with a scope in the office to remove the stent. If a thread is left on, did not pull on it until instructed. It is okay to remove the stent on Thursday morning  Diet  Once you have adequately recovered from anesthesia, you may gradually advance your diet, as tolerated, to your regular diet.  Activities  You may gradually increase your activities to your normal unrestricted level the day following your procedure.  Medications  You should resume all preoperative medications. If you are on aspirin-like compounds, you should not resume these until the blood clears from your urine. If given an antibiotic by the surgeon, take these until they are completed. You may also be given, if you have a stent, medications to decrease the urinary frequency and urgency.  Pain  After ureteroscopy, there may be some pain on the side of the scope. Take your pain medicine for this. Usually, this pain resolves within a day or 2.  Fever  Please report any fever over 100 to the doctor.  Post Anesthesia Home Care Instructions  Activity: Get plenty of rest for the remainder of the day. A responsible adult should stay with you for 24 hours following the procedure.  For the next 24 hours, DO NOT: -Drive a car -Paediatric nurse -Drink alcoholic beverages -Take any medication unless instructed by your physician -Make any legal decisions or sign important papers.  Meals: Start with liquid foods such  as gelatin or soup. Progress to regular foods as tolerated. Avoid greasy, spicy, heavy foods. If nausea and/or vomiting occur, drink only clear liquids until the nausea and/or vomiting subsides. Call your physician if vomiting continues.  Special Instructions/Symptoms: Your throat may feel dry or sore from the anesthesia or the breathing tube placed in your throat during surgery. If this causes discomfort, gargle with warm salt water. The discomfort should disappear within 24 hours.  If you had a scopolamine patch placed behind your ear for the management of post- operative nausea and/or vomiting:  1. The medication in the patch is effective for 72 hours, after which it should be removed.  Wrap patch in a tissue and discard in the trash. Wash hands thoroughly with soap and water. 2. You may remove the patch earlier than 72 hours if you experience unpleasant side effects which may include dry mouth, dizziness or visual disturbances. 3. Avoid touching the patch. Wash your hands with soap and water after contact with the patch.

## 2014-10-11 NOTE — Op Note (Signed)
PATIENT:  Randall Bradley  PRE-OPERATIVE DIAGNOSIS: Right distal ureteral stone  POST-OPERATIVE DIAGNOSIS: Same  PROCEDURE: Cystoscopy, right retrograde ureteropyelogram, right ureteroscopy, holmium laser and extraction of right ureteral calculus, interpretive fluoroscopy, placement of 6 French by 24 cm contour stent with string  SURGEON:  Lillette Boxer. Meerab Maselli, M.D.  ANESTHESIA:  General  EBL:  Minimal  DRAINS: None  LOCAL MEDICATIONS USED:  None  SPECIMEN:  None  INDICATION: KAYIN OSMENT is a 59 year old male with recent diagnosis of an 63mm right distal ureteral stone. The patients has been intermittently symptomatic for about 8 months. [place], it was recommended that he have an urgent stone procedure. Lithotripsy and ureteroscopy were discussed-due to the size and location of the stone, it was recommended that he have ureteroscopy, probable holmium laser and extraction of stone. Placement of stent was also discussed with him. He presents for that procedure   Description of procedure: The patient was properly identified and marked (if applicable) in the holding area. They were then  taken to the operating room and placed on the table in a supine position. General anesthesia was then administered. Once fully anesthetized the patient was moved to the dorsolithotomy position and the genitalia and perineum were sterilely prepped and draped in standard fashion. An official timeout was then performed.   A 22 French panendoscope was passed under direct vision through his urethra which was free of stricture. There was a mild bladder neck contraction which did admit the beak of the scope with minimal problem. The bladder was inspected circumferentially. There were no tumors trabeculations or foreign bodies. Ureteral orifices were normal in configuration and location.  The right ureteral orifice was cannulated with an 6 [place] open-ended catheter. Retrograde ureteropyelogram was  performed using Omnipaque. This showed a normal ureter throughout its course except for a filling defect approximately 2 cm up consistent with the previously mentioned right distal ureteral stone. Right pyelocalyceal system was normal.  I threaded a guidewire through the open-ended catheter up into the right renal pelvis where good curl was seen. The cystoscope and the open-ended catheter were removed. I dilated the right distal ureter with first the inner core and then the entire 12/14 ureteral access catheter. This dilated up to the stone using fluoroscopic guidance. The ureteral access sheath was then removed. I then passed a 6 French ureteroscope through the urethra and up to the stone, passing the ureteral orifice. The stone was seemingly and packed it. I used the 365  laser fiber to deliver holmium energy at 0.5 J and a rate of 10 Hz. The stone was fragmented into multiple smaller fragments. The laser fiber was then removed, and using a Nitinol basket all the stone fragments were extracted into the bladder. Careful inspection of the entire ureter using the length of the scope was performed after stone extraction. No further stones were seen. At this point, the ureteroscope was removed.  I then replaced a 22 French panendoscope over the guidewire. I then passed a 24 cm x 6 French contour stent leaving the string on. Good proximal and distal curls were seen in the renal pelvis and bladder using fluoroscopic and cystoscopic guidance following removal of the guidewire. I attempted to irrigate a few fragments from the bladder-I could not get any of these to rinse out. It was felt that the patient will void these out after the procedure. The bladder was drained. The scope was removed. The string was taped to the patient's penis. If  he was then awakened and taken to the PACU in stable condition. He tolerated the procedure well.   PLAN OF CARE: Discharge to home after PACU  PATIENT DISPOSITION:  PACU -  hemodynamically stable.

## 2014-10-12 ENCOUNTER — Encounter (HOSPITAL_BASED_OUTPATIENT_CLINIC_OR_DEPARTMENT_OTHER): Payer: Self-pay | Admitting: Urology

## 2015-04-12 ENCOUNTER — Other Ambulatory Visit: Payer: Self-pay | Admitting: Physician Assistant

## 2015-04-12 NOTE — H&P (Signed)
Fields is a pleasant 59 year old active gentleman who presents to our clinic today with continued left knee pain. He has a history of endstage degenerative joint disease of the left knee. This has been ongoing for the past several years but over the past three months it has progressively worsened. 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 The pain in his left knee is located primarily on the lateral side. This is a constant ache with associated stiffness with instability and catching. He is having some rest and night pain. He has tried Aleve with minimal relief of his symptoms. He has failed cortisone injections in the past. His right knee is now starting to bother him a little most likely because he is favoring it due to the left.  At this point, he is ready to proceed with definitive treatment of a total knee replacement.  Past medical, social and family history reviewed in detail on the patient questionnaire and signed.  Review of systems: As detailed in the HPI. All others reviewed and are negative.  EXAMINATION: Well-developed, well-nourished male in no acute distress. Alert and oriented times three.  Examination of his left knee reveals a range of motion of 0-125 degrees. Moderate lateral greater than medial joint line tenderness. Moderate patellofemoral crepitus. Stable to varus and valgus test. Neurovascularly intact distally.  IMPRESSION:  Bilateral endstage degenerative joint disease.  PLAN:  At the end of the day Kenith is well aware he will need bilateral sequential total knee replacements. He would like to proceed with the left knee first.  We discussed these will need to be six weeks apart. We have filled out paperwork to proceed with a left total knee replacement. The procedure, risks, benefits and complications were reviewed. Rehab and recovery time discussed. All questions were encouraged and answered. We will  see Vue just prior tooperative intervention.   Electronically verified by [name], M.D. DFM:(LS):jgc

## 2015-04-14 NOTE — Pre-Procedure Instructions (Signed)
Randall Bradley  04/14/2015     Your procedure is scheduled on : Wednesday April 27, 2015.  Report to [facility] Admitting at 8:50 A.M.  Call this number if you have problems the morning of surgery: [phone]    Remember:  Do not eat food or drink liquids after midnight.  Take these medicines the morning of surgery with A SIP OF WATER : Finasteride   Stop taking any vitamins, herbal medications, Naproxen/Aleve, Ibuprofen, Advil, Motrin, etc on Wednesday October 26th   Do not wear jewelry.  Do not wear lotions, powders, or cologne.   Men may shave face and neck.  Do not bring valuables to the hospital.  [facility] is not responsible for any belongings or valuables.  Contacts, dentures or bridgework may not be worn into surgery.  Leave your suitcase in the car.  After surgery it may be brought to your room.  For patients admitted to the hospital, discharge time will be determined by your treatment team.  Patients discharged the day of surgery will not be allowed to drive home.   Name and phone number of your driver:    Special instructions:  Shower using CHG soap the night before and the morning of your surgeyr  Please read over the following fact sheets that you were given. Pain Booklet, Coughing and Deep Breathing, Blood Transfusion Information, Total Joint Packet, MRSA Information and Surgical Site Infection Prevention

## 2015-04-15 ENCOUNTER — Encounter (HOSPITAL_COMMUNITY): Payer: Self-pay

## 2015-04-15 ENCOUNTER — Encounter (HOSPITAL_COMMUNITY)
Admission: RE | Admit: 2015-04-15 | Discharge: 2015-04-15 | Disposition: A | Discharge status: Home / Self Care | Payer: BLUE CROSS/BLUE SHIELD | Source: Ambulatory Visit | Attending: Orthopedic Surgery | Admitting: Orthopedic Surgery

## 2015-04-15 DIAGNOSIS — M1712 Unilateral primary osteoarthritis, left knee: Secondary | ICD-10-CM | POA: Diagnosis not present

## 2015-04-15 DIAGNOSIS — Z0183 Encounter for blood typing: Secondary | ICD-10-CM | POA: Insufficient documentation

## 2015-04-15 DIAGNOSIS — Z8546 Personal history of malignant neoplasm of prostate: Secondary | ICD-10-CM | POA: Diagnosis not present

## 2015-04-15 DIAGNOSIS — Z87891 Personal history of nicotine dependence: Secondary | ICD-10-CM | POA: Diagnosis present

## 2015-04-15 DIAGNOSIS — Z7982 Long term (current) use of aspirin: Secondary | ICD-10-CM | POA: Diagnosis not present

## 2015-04-15 DIAGNOSIS — Z01818 Encounter for other preprocedural examination: Secondary | ICD-10-CM | POA: Diagnosis present

## 2015-04-15 DIAGNOSIS — Z79899 Other long term (current) drug therapy: Secondary | ICD-10-CM | POA: Insufficient documentation

## 2015-04-15 DIAGNOSIS — I10 Essential (primary) hypertension: Secondary | ICD-10-CM | POA: Insufficient documentation

## 2015-04-15 DIAGNOSIS — R9431 Abnormal electrocardiogram [ECG] [EKG]: Secondary | ICD-10-CM | POA: Diagnosis not present

## 2015-04-15 DIAGNOSIS — Z01812 Encounter for preprocedural laboratory examination: Secondary | ICD-10-CM | POA: Diagnosis not present

## 2015-04-15 HISTORY — DX: Personal history of urinary calculi: Z87.442

## 2015-04-15 HISTORY — DX: Essential (primary) hypertension: I10

## 2015-04-15 HISTORY — DX: Unspecified osteoarthritis, unspecified site: M19.90

## 2015-04-15 LAB — PROTIME-INR
INR: 1.08 (ref 0.00–1.49)
Prothrombin Time: 14.2 seconds (ref 11.6–15.2)

## 2015-04-15 LAB — CBC WITH DIFFERENTIAL/PLATELET
Basophils Absolute: 0 10*3/uL (ref 0.0–0.1)
Basophils Relative: 0 %
Eosinophils Absolute: 0.2 10*3/uL (ref 0.0–0.7)
Eosinophils Relative: 3 %
HCT: 44.4 % (ref 39.0–52.0)
HEMOGLOBIN: 15.3 g/dL (ref 13.0–17.0)
LYMPHS ABS: 2.1 10*3/uL (ref 0.7–4.0)
Lymphocytes Relative: 37 %
MCH: 29.5 pg (ref 26.0–34.0)
MCHC: 34.5 g/dL (ref 30.0–36.0)
MCV: 85.7 fL (ref 78.0–100.0)
MONOS PCT: 7 %
Monocytes Absolute: 0.4 10*3/uL (ref 0.1–1.0)
NEUTROS PCT: 53 %
Neutro Abs: 3 10*3/uL (ref 1.7–7.7)
Platelets: 171 10*3/uL (ref 150–400)
RBC: 5.18 MIL/uL (ref 4.22–5.81)
RDW: 12.4 % (ref 11.5–15.5)
WBC: 5.7 10*3/uL (ref 4.0–10.5)

## 2015-04-15 LAB — APTT: aPTT: 31 seconds (ref 24–37)

## 2015-04-15 LAB — COMPREHENSIVE METABOLIC PANEL
ALT: 19 U/L (ref 17–63)
ANION GAP: 10 (ref 5–15)
AST: 24 U/L (ref 15–41)
Albumin: 4 g/dL (ref 3.5–5.0)
Alkaline Phosphatase: 81 U/L (ref 38–126)
BILIRUBIN TOTAL: 0.9 mg/dL (ref 0.3–1.2)
BUN: 18 mg/dL (ref 6–20)
CALCIUM: 9.2 mg/dL (ref 8.9–10.3)
CO2: 27 mmol/L (ref 22–32)
CREATININE: 1.15 mg/dL (ref 0.61–1.24)
Chloride: 99 mmol/L — ABNORMAL LOW (ref 101–111)
GFR calc Af Amer: >60 mL/min (ref >60)
GFR calc non Af Amer: >60 mL/min (ref >60)
GLUCOSE: 97 mg/dL (ref 65–99)
Potassium: 4.4 mmol/L (ref 3.5–5.1)
Sodium: 136 mmol/L (ref 135–145)
TOTAL PROTEIN: 6.5 g/dL (ref 6.5–8.1)

## 2015-04-15 LAB — SURGICAL PCR SCREEN
MRSA, PCR: NEGATIVE
Staphylococcus aureus: NEGATIVE

## 2015-04-15 LAB — ABO/RH: ABO/RH(D): O POS

## 2015-04-15 NOTE — Progress Notes (Signed)
PCP is [name]  Patient denied having any acute cardiac or pulmonary issues

## 2015-04-16 LAB — URINE CULTURE: Culture: NO GROWTH

## 2015-04-16 LAB — TYPE AND SCREEN
ABO/RH(D): O POS
Antibody Screen: NEGATIVE

## 2015-04-18 NOTE — Progress Notes (Signed)
Anesthesia Chart Review:  Pt is 59 year old male scheduled for L total knee arthroplasty on 04/27/2015 with Dr. Maryla Morrow.   PMH includes: HTN, hemochromatosis, prostate cancer. Former smoker. BMI 26.  Medications include: ASA, losartan.  Preoperative labs reviewed.    EKG 04/15/2015: NSR. Left axis deviation. Cannot rule out Inferior infarct, age undetermined. No significant change from 10/05/09 per Dr. Irven Shelling interpretation.   If no changes, I anticipate pt can proceed with surgery as scheduled.   Willeen Cass, FNP-BC [facility]/Anesthesiology Phone: [phone] 04/18/2015 4:08 PM

## 2015-04-26 MED ORDER — SODIUM CHLORIDE 0.9 % IV SOLN
1000.0000 mg | INTRAVENOUS | Status: AC
  Administered 2015-04-27: 1000 mg via INTRAVENOUS
  Filled 2015-04-26: qty 10

## 2015-04-26 MED ORDER — CHLORHEXIDINE GLUCONATE 4 % EX LIQD: 60.0000 mL | Freq: Once | CUTANEOUS | Status: DC

## 2015-04-26 MED ORDER — TRANEXAMIC ACID 1000 MG/10ML IV SOLN
1000.0000 mg | INTRAVENOUS | Status: DC
  Filled 2015-04-26: qty 10

## 2015-04-26 MED ORDER — CEFAZOLIN SODIUM-DEXTROSE 2-3 GM-% IV SOLR
2.0000 g | INTRAVENOUS | Status: AC
  Administered 2015-04-27: 2 g via INTRAVENOUS
  Filled 2015-04-26: qty 50

## 2015-04-26 MED ORDER — LACTATED RINGERS IV SOLN
INTRAVENOUS | Status: DC
  Administered 2015-04-27 (×2): via INTRAVENOUS

## 2015-04-27 ENCOUNTER — Encounter (HOSPITAL_COMMUNITY): Payer: Self-pay | Admitting: *Deleted

## 2015-04-27 ENCOUNTER — Inpatient Hospital Stay (HOSPITAL_COMMUNITY)
Admission: RE | Admit: 2015-04-27 | Discharge: 2015-04-28 | DRG: 470 | Disposition: A | Discharge status: Home Health | Payer: BLUE CROSS/BLUE SHIELD | Source: Ambulatory Visit | Attending: Orthopedic Surgery | Admitting: Orthopedic Surgery

## 2015-04-27 ENCOUNTER — Inpatient Hospital Stay (HOSPITAL_COMMUNITY): Payer: BLUE CROSS/BLUE SHIELD | Admitting: Anesthesiology

## 2015-04-27 ENCOUNTER — Inpatient Hospital Stay (HOSPITAL_COMMUNITY): Payer: BLUE CROSS/BLUE SHIELD

## 2015-04-27 ENCOUNTER — Encounter (HOSPITAL_COMMUNITY)
Admission: RE | Disposition: A | Discharge status: Home Health | Payer: Self-pay | Source: Ambulatory Visit | Attending: Orthopedic Surgery

## 2015-04-27 ENCOUNTER — Inpatient Hospital Stay (HOSPITAL_COMMUNITY): Payer: BLUE CROSS/BLUE SHIELD | Admitting: Emergency Medicine

## 2015-04-27 DIAGNOSIS — M170 Bilateral primary osteoarthritis of knee: Secondary | ICD-10-CM | POA: Diagnosis present

## 2015-04-27 DIAGNOSIS — Z9079 Acquired absence of other genital organ(s): Secondary | ICD-10-CM

## 2015-04-27 DIAGNOSIS — Z96652 Presence of left artificial knee joint: Secondary | ICD-10-CM

## 2015-04-27 DIAGNOSIS — M25562 Pain in left knee: Secondary | ICD-10-CM | POA: Diagnosis present

## 2015-04-27 DIAGNOSIS — Z8546 Personal history of malignant neoplasm of prostate: Secondary | ICD-10-CM | POA: Diagnosis not present

## 2015-04-27 DIAGNOSIS — I10 Essential (primary) hypertension: Secondary | ICD-10-CM | POA: Diagnosis present

## 2015-04-27 DIAGNOSIS — M179 Osteoarthritis of knee, unspecified: Secondary | ICD-10-CM | POA: Diagnosis present

## 2015-04-27 DIAGNOSIS — M1710 Unilateral primary osteoarthritis, unspecified knee: Secondary | ICD-10-CM | POA: Diagnosis present

## 2015-04-27 HISTORY — PX: TOTAL KNEE ARTHROPLASTY: SHX125

## 2015-04-27 SURGERY — ARTHROPLASTY, KNEE, TOTAL
Anesthesia: General | Site: Knee | Laterality: Left

## 2015-04-27 MED ORDER — GLYCOPYRROLATE 0.2 MG/ML IJ SOLN
INTRAMUSCULAR | Status: AC
  Filled 2015-04-27: qty 4

## 2015-04-27 MED ORDER — ROCURONIUM BROMIDE 50 MG/5ML IV SOLN
INTRAVENOUS | Status: AC
  Filled 2015-04-27: qty 1

## 2015-04-27 MED ORDER — MAGNESIUM CITRATE PO SOLN: 1.0000 | Freq: Once | ORAL | Status: DC | PRN

## 2015-04-27 MED ORDER — ONDANSETRON HCL 4 MG PO TABS: 4.0000 mg | ORAL_TABLET | Freq: Four times a day (QID) | ORAL | Status: DC | PRN

## 2015-04-27 MED ORDER — BUPIVACAINE HCL (PF) 0.5 % IJ SOLN
INTRAMUSCULAR | Status: AC
  Filled 2015-04-27: qty 10

## 2015-04-27 MED ORDER — ALUM & MAG HYDROXIDE-SIMETH 200-200-20 MG/5ML PO SUSP: 30.0000 mL | ORAL | Status: DC | PRN

## 2015-04-27 MED ORDER — OXYCODONE HCL 5 MG/5ML PO SOLN: 5.0000 mg | Freq: Once | ORAL | Status: AC | PRN

## 2015-04-27 MED ORDER — ONDANSETRON HCL 4 MG/2ML IJ SOLN: 4.0000 mg | Freq: Four times a day (QID) | INTRAMUSCULAR | Status: DC | PRN

## 2015-04-27 MED ORDER — OXYCODONE HCL 5 MG PO TABS
ORAL_TABLET | ORAL | Status: AC
  Administered 2015-04-27: 10 mg via ORAL
  Filled 2015-04-27: qty 2

## 2015-04-27 MED ORDER — DOCUSATE SODIUM 100 MG PO CAPS
100.0000 mg | ORAL_CAPSULE | Freq: Two times a day (BID) | ORAL | Status: DC
  Administered 2015-04-27 – 2015-04-28 (×2): 100 mg via ORAL
  Filled 2015-04-27 (×2): qty 1

## 2015-04-27 MED ORDER — EPHEDRINE SULFATE 50 MG/ML IJ SOLN
INTRAMUSCULAR | Status: AC
  Filled 2015-04-27: qty 2

## 2015-04-27 MED ORDER — DEXTROSE 5 % IV SOLN
500.0000 mg | Freq: Four times a day (QID) | INTRAVENOUS | Status: DC | PRN
  Filled 2015-04-27: qty 5

## 2015-04-27 MED ORDER — ONDANSETRON HCL 4 MG/2ML IJ SOLN: 4.0000 mg | Freq: Once | INTRAMUSCULAR | Status: DC | PRN

## 2015-04-27 MED ORDER — BUPIVACAINE HCL 0.5 % IJ SOLN
INTRAMUSCULAR | Status: DC | PRN
Start: 2015-04-27 — End: 2015-04-27
  Administered 2015-04-27: 10 mL

## 2015-04-27 MED ORDER — DIPHENHYDRAMINE HCL 12.5 MG/5ML PO ELIX: 12.5000 mg | ORAL_SOLUTION | ORAL | Status: DC | PRN

## 2015-04-27 MED ORDER — PROPOFOL 10 MG/ML IV BOLUS
INTRAVENOUS | Status: DC | PRN
  Administered 2015-04-27: 200 mg via INTRAVENOUS

## 2015-04-27 MED ORDER — CELECOXIB 200 MG PO CAPS
200.0000 mg | ORAL_CAPSULE | Freq: Two times a day (BID) | ORAL | Status: DC
  Administered 2015-04-27 – 2015-04-28 (×2): 200 mg via ORAL
  Filled 2015-04-27 (×2): qty 1

## 2015-04-27 MED ORDER — MINOCYCLINE HCL 100 MG PO CAPS
100.0000 mg | ORAL_CAPSULE | Freq: Every evening | ORAL | Status: DC
  Administered 2015-04-27: 100 mg via ORAL
  Filled 2015-04-27 (×2): qty 1

## 2015-04-27 MED ORDER — FENTANYL CITRATE (PF) 250 MCG/5ML IJ SOLN
INTRAMUSCULAR | Status: AC
  Filled 2015-04-27: qty 5

## 2015-04-27 MED ORDER — FENTANYL CITRATE (PF) 100 MCG/2ML IJ SOLN
INTRAMUSCULAR | Status: AC
  Administered 2015-04-27: 50 ug via INTRAVENOUS
  Filled 2015-04-27: qty 2

## 2015-04-27 MED ORDER — APIXABAN 2.5 MG PO TABS
2.5000 mg | ORAL_TABLET | Freq: Two times a day (BID) | ORAL | Status: DC
  Administered 2015-04-28: 2.5 mg via ORAL
  Filled 2015-04-27: qty 1

## 2015-04-27 MED ORDER — PNEUMOCOCCAL VAC POLYVALENT 25 MCG/0.5ML IJ INJ
0.5000 mL | INJECTION | INTRAMUSCULAR | Status: AC
  Administered 2015-04-28: 0.5 mL via INTRAMUSCULAR
  Filled 2015-04-27: qty 0.5

## 2015-04-27 MED ORDER — CEFAZOLIN SODIUM-DEXTROSE 2-3 GM-% IV SOLR
2.0000 g | Freq: Four times a day (QID) | INTRAVENOUS | Status: AC
  Administered 2015-04-27 – 2015-04-28 (×2): 2 g via INTRAVENOUS
  Filled 2015-04-27 (×2): qty 50

## 2015-04-27 MED ORDER — BUPIVACAINE LIPOSOME 1.3 % IJ SUSP
20.0000 mL | INTRAMUSCULAR | Status: AC
  Administered 2015-04-27: 20 mL
  Filled 2015-04-27: qty 20

## 2015-04-27 MED ORDER — MIDAZOLAM HCL 2 MG/2ML IJ SOLN
INTRAMUSCULAR | Status: AC
  Filled 2015-04-27: qty 4

## 2015-04-27 MED ORDER — 0.9 % SODIUM CHLORIDE (POUR BTL) OPTIME
TOPICAL | Status: DC | PRN
  Administered 2015-04-27: 1000 mL

## 2015-04-27 MED ORDER — MIDAZOLAM HCL 5 MG/5ML IJ SOLN
INTRAMUSCULAR | Status: DC | PRN
  Administered 2015-04-27: 2 mg via INTRAVENOUS

## 2015-04-27 MED ORDER — DEXAMETHASONE SODIUM PHOSPHATE 10 MG/ML IJ SOLN: 10.0000 mg | Freq: Once | INTRAMUSCULAR | Status: DC

## 2015-04-27 MED ORDER — OXYCODONE HCL 5 MG PO TABS
5.0000 mg | ORAL_TABLET | Freq: Once | ORAL | Status: AC | PRN
  Administered 2015-04-27: 5 mg via ORAL

## 2015-04-27 MED ORDER — LIDOCAINE HCL (CARDIAC) 20 MG/ML IV SOLN
INTRAVENOUS | Status: AC
  Filled 2015-04-27: qty 5

## 2015-04-27 MED ORDER — ONDANSETRON HCL 4 MG/2ML IJ SOLN
INTRAMUSCULAR | Status: DC | PRN
  Administered 2015-04-27: 4 mg via INTRAVENOUS

## 2015-04-27 MED ORDER — SODIUM CHLORIDE 0.9 % IJ SOLN
INTRAMUSCULAR | Status: AC
  Filled 2015-04-27: qty 20

## 2015-04-27 MED ORDER — PHENOL 1.4 % MT LIQD
1.0000 | OROMUCOSAL | Status: DC | PRN
  Filled 2015-04-27: qty 177

## 2015-04-27 MED ORDER — MENTHOL 3 MG MT LOZG: 1.0000 | LOZENGE | OROMUCOSAL | Status: DC | PRN

## 2015-04-27 MED ORDER — LIDOCAINE HCL (CARDIAC) 20 MG/ML IV SOLN
INTRAVENOUS | Status: DC | PRN
  Administered 2015-04-27: 40 mg via INTRAVENOUS

## 2015-04-27 MED ORDER — METHOCARBAMOL 500 MG PO TABS
500.0000 mg | ORAL_TABLET | Freq: Four times a day (QID) | ORAL | Status: DC | PRN
Start: 2015-04-27 — End: 2015-04-28
  Administered 2015-04-27 – 2015-04-28 (×4): 500 mg via ORAL
  Filled 2015-04-27 (×3): qty 1

## 2015-04-27 MED ORDER — EPHEDRINE SULFATE 50 MG/ML IJ SOLN
INTRAMUSCULAR | Status: DC | PRN
  Administered 2015-04-27: 10 mg via INTRAVENOUS
  Administered 2015-04-27: 5 mg via INTRAVENOUS
  Administered 2015-04-27: 10 mg via INTRAVENOUS

## 2015-04-27 MED ORDER — ACETAMINOPHEN 650 MG RE SUPP: 650.0000 mg | Freq: Four times a day (QID) | RECTAL | Status: DC | PRN

## 2015-04-27 MED ORDER — HYDROMORPHONE HCL 1 MG/ML IJ SOLN
0.5000 mg | INTRAMUSCULAR | Status: DC | PRN
  Administered 2015-04-27: 1 mg via INTRAVENOUS

## 2015-04-27 MED ORDER — OXYCODONE HCL 5 MG PO TABS
5.0000 mg | ORAL_TABLET | ORAL | Status: DC | PRN
  Administered 2015-04-27 – 2015-04-28 (×8): 10 mg via ORAL
  Filled 2015-04-27 (×7): qty 2

## 2015-04-27 MED ORDER — ZOLPIDEM TARTRATE 5 MG PO TABS: 5.0000 mg | ORAL_TABLET | Freq: Every evening | ORAL | Status: DC | PRN

## 2015-04-27 MED ORDER — GLYCOPYRROLATE 0.2 MG/ML IJ SOLN
INTRAMUSCULAR | Status: DC | PRN
  Administered 2015-04-27: 0.2 mg via INTRAVENOUS

## 2015-04-27 MED ORDER — SODIUM CHLORIDE 0.9 % IR SOLN
Status: DC | PRN
  Administered 2015-04-27 (×2): 1000 mL

## 2015-04-27 MED ORDER — LOSARTAN POTASSIUM 50 MG PO TABS
50.0000 mg | ORAL_TABLET | Freq: Every day | ORAL | Status: DC
  Administered 2015-04-27: 50 mg via ORAL
  Filled 2015-04-27: qty 1

## 2015-04-27 MED ORDER — ACETAMINOPHEN 325 MG PO TABS: 650.0000 mg | ORAL_TABLET | Freq: Four times a day (QID) | ORAL | Status: DC | PRN

## 2015-04-27 MED ORDER — POTASSIUM CHLORIDE IN NACL 20-0.9 MEQ/L-% IV SOLN
INTRAVENOUS | Status: DC
  Administered 2015-04-27: 19:00:00 via INTRAVENOUS
  Filled 2015-04-27: qty 1000

## 2015-04-27 MED ORDER — ONDANSETRON HCL 4 MG/2ML IJ SOLN
INTRAMUSCULAR | Status: AC
  Filled 2015-04-27: qty 2

## 2015-04-27 MED ORDER — OXYCODONE HCL 5 MG PO TABS
ORAL_TABLET | ORAL | Status: AC
  Administered 2015-04-27: 5 mg via ORAL
  Filled 2015-04-27: qty 1

## 2015-04-27 MED ORDER — NEOSTIGMINE METHYLSULFATE 10 MG/10ML IV SOLN
INTRAVENOUS | Status: AC
  Filled 2015-04-27: qty 1

## 2015-04-27 MED ORDER — METHOCARBAMOL 500 MG PO TABS
ORAL_TABLET | ORAL | Status: AC
  Filled 2015-04-27: qty 1

## 2015-04-27 MED ORDER — FENTANYL CITRATE (PF) 100 MCG/2ML IJ SOLN
INTRAMUSCULAR | Status: DC | PRN
  Administered 2015-04-27 (×6): 50 ug via INTRAVENOUS
  Administered 2015-04-27: 100 ug via INTRAVENOUS

## 2015-04-27 MED ORDER — FENTANYL CITRATE (PF) 100 MCG/2ML IJ SOLN
25.0000 ug | INTRAMUSCULAR | Status: DC | PRN
  Administered 2015-04-27 (×3): 50 ug via INTRAVENOUS

## 2015-04-27 MED ORDER — DIPHENHYDRAMINE HCL 25 MG PO CAPS
25.0000 mg | ORAL_CAPSULE | Freq: Four times a day (QID) | ORAL | Status: DC | PRN
  Filled 2015-04-27: qty 1

## 2015-04-27 MED ORDER — METOCLOPRAMIDE HCL 5 MG/ML IJ SOLN: 5.0000 mg | Freq: Three times a day (TID) | INTRAMUSCULAR | Status: DC | PRN

## 2015-04-27 MED ORDER — METOCLOPRAMIDE HCL 5 MG PO TABS: 5.0000 mg | ORAL_TABLET | Freq: Three times a day (TID) | ORAL | Status: DC | PRN

## 2015-04-27 MED ORDER — DEXAMETHASONE SODIUM PHOSPHATE 10 MG/ML IJ SOLN
INTRAMUSCULAR | Status: AC
  Filled 2015-04-27: qty 1

## 2015-04-27 MED ORDER — BISACODYL 10 MG RE SUPP: 10.0000 mg | Freq: Every day | RECTAL | Status: DC | PRN

## 2015-04-27 MED ORDER — POLYETHYLENE GLYCOL 3350 17 G PO PACK: 17.0000 g | PACK | Freq: Every day | ORAL | Status: DC | PRN

## 2015-04-27 MED ORDER — INFLUENZA VAC SPLIT QUAD 0.5 ML IM SUSY
0.5000 mL | PREFILLED_SYRINGE | INTRAMUSCULAR | Status: AC
  Administered 2015-04-28: 0.5 mL via INTRAMUSCULAR
  Filled 2015-04-27: qty 0.5

## 2015-04-27 MED ORDER — DEXAMETHASONE SODIUM PHOSPHATE 10 MG/ML IJ SOLN
INTRAMUSCULAR | Status: DC | PRN
  Administered 2015-04-27: 10 mg via INTRAVENOUS

## 2015-04-27 MED ORDER — HYDROMORPHONE HCL 1 MG/ML IJ SOLN
INTRAMUSCULAR | Status: AC
  Filled 2015-04-27: qty 1

## 2015-04-27 MED ORDER — PROPOFOL 10 MG/ML IV BOLUS
INTRAVENOUS | Status: AC
  Filled 2015-04-27: qty 20

## 2015-04-27 SURGICAL SUPPLY — 65 items
APL SKNCLS STERI-STRIP NONHPOA (GAUZE/BANDAGES/DRESSINGS) ×1
BANDAGE ELASTIC 4 VELCRO ST LF (GAUZE/BANDAGES/DRESSINGS) ×2 IMPLANT
BANDAGE ELASTIC 6 VELCRO ST LF (GAUZE/BANDAGES/DRESSINGS) ×2 IMPLANT
BANDAGE ESMARK 6X9 LF (GAUZE/BANDAGES/DRESSINGS) ×1 IMPLANT
BENZOIN TINCTURE PRP APPL 2/3 (GAUZE/BANDAGES/DRESSINGS) ×2 IMPLANT
BLADE SAG 18X100X1.27 (BLADE) ×4 IMPLANT
BNDG CMPR 9X6 STRL LF SNTH (GAUZE/BANDAGES/DRESSINGS) ×1
BNDG ESMARK 6X9 LF (GAUZE/BANDAGES/DRESSINGS) ×2
BOWL SMART MIX CTS (DISPOSABLE) ×2 IMPLANT
CAPT KNEE TOTAL 3 ×1 IMPLANT
CEMENT BONE SIMPLEX SPEEDSET (Cement) ×4 IMPLANT
COVER SURGICAL LIGHT HANDLE (MISCELLANEOUS) ×2 IMPLANT
CUFF TOURNIQUET SINGLE 34IN LL (TOURNIQUET CUFF) ×2 IMPLANT
DRAPE EXTREMITY T 121X128X90 (DRAPE) ×2 IMPLANT
DRAPE IMP U-DRAPE 54X76 (DRAPES) ×2 IMPLANT
DRAPE PROXIMA HALF (DRAPES) ×2 IMPLANT
DRAPE U-SHAPE 47X51 STRL (DRAPES) ×2 IMPLANT
DRSG PAD ABDOMINAL 8X10 ST (GAUZE/BANDAGES/DRESSINGS) ×2 IMPLANT
DURAPREP 26ML APPLICATOR (WOUND CARE) ×4 IMPLANT
ELECT CAUTERY BLADE 6.4 (BLADE) ×2 IMPLANT
ELECT REM PT RETURN 9FT ADLT (ELECTROSURGICAL) ×2
ELECTRODE REM PT RTRN 9FT ADLT (ELECTROSURGICAL) ×1 IMPLANT
EVACUATOR 1/8 PVC DRAIN (DRAIN) ×2 IMPLANT
FACESHIELD WRAPAROUND (MASK) ×4 IMPLANT
FACESHIELD WRAPAROUND OR TEAM (MASK) ×2 IMPLANT
GAUZE SPONGE 4X4 12PLY STRL (GAUZE/BANDAGES/DRESSINGS) ×2 IMPLANT
GLOVE BIOGEL PI IND STRL 7.0 (GLOVE) ×1 IMPLANT
GLOVE BIOGEL PI INDICATOR 7.0 (GLOVE) ×1
GLOVE ORTHO TXT STRL SZ7.5 (GLOVE) ×2 IMPLANT
GLOVE SURG ORTHO 7.0 STRL STRW (GLOVE) ×2 IMPLANT
GOWN STRL REUS W/ TWL LRG LVL3 (GOWN DISPOSABLE) ×2 IMPLANT
GOWN STRL REUS W/ TWL XL LVL3 (GOWN DISPOSABLE) ×1 IMPLANT
GOWN STRL REUS W/TWL LRG LVL3 (GOWN DISPOSABLE) ×4
GOWN STRL REUS W/TWL XL LVL3 (GOWN DISPOSABLE) ×4
HANDPIECE INTERPULSE COAX TIP (DISPOSABLE) ×2
IMMOBILIZER KNEE 22 UNIV (SOFTGOODS) ×1 IMPLANT
IMMOBILIZER KNEE 24 THIGH 36 (MISCELLANEOUS) IMPLANT
IMMOBILIZER KNEE 24 UNIV (MISCELLANEOUS) ×2
KIT BASIN OR (CUSTOM PROCEDURE TRAY) ×2 IMPLANT
KIT ROOM TURNOVER OR (KITS) ×2 IMPLANT
MANIFOLD NEPTUNE II (INSTRUMENTS) ×2 IMPLANT
NDL 18GX1X1/2 (RX/OR ONLY) (NEEDLE) ×1 IMPLANT
NDL HYPO 25GX1X1/2 BEV (NEEDLE) ×1 IMPLANT
NEEDLE 18GX1X1/2 (RX/OR ONLY) (NEEDLE) ×2 IMPLANT
NEEDLE HYPO 25GX1X1/2 BEV (NEEDLE) ×2 IMPLANT
NS IRRIG 1000ML POUR BTL (IV SOLUTION) ×2 IMPLANT
PACK TOTAL JOINT (CUSTOM PROCEDURE TRAY) ×2 IMPLANT
PACK UNIVERSAL I (CUSTOM PROCEDURE TRAY) ×2 IMPLANT
PAD ARMBOARD 7.5X6 YLW CONV (MISCELLANEOUS) ×4 IMPLANT
PAD CAST 4YDX4 CTTN HI CHSV (CAST SUPPLIES) ×1 IMPLANT
PADDING CAST COTTON 4X4 STRL (CAST SUPPLIES) ×2
SET HNDPC FAN SPRY TIP SCT (DISPOSABLE) ×1 IMPLANT
STRIP CLOSURE SKIN 1/2X4 (GAUZE/BANDAGES/DRESSINGS) ×3 IMPLANT
SUCTION FRAZIER TIP 10 FR DISP (SUCTIONS) ×2 IMPLANT
SUT MNCRL AB 4-0 PS2 18 (SUTURE) ×2 IMPLANT
SUT VIC AB 0 CT1 27 (SUTURE)
SUT VIC AB 0 CT1 27XBRD ANBCTR (SUTURE) IMPLANT
SUT VIC AB 1 CTX 36 (SUTURE) ×4
SUT VIC AB 1 CTX36XBRD ANBCTR (SUTURE) ×1 IMPLANT
SUT VIC AB 2-0 CT1 27 (SUTURE) ×4
SUT VIC AB 2-0 CT1 TAPERPNT 27 (SUTURE) ×2 IMPLANT
SYR 50ML LL SCALE MARK (SYRINGE) ×2 IMPLANT
SYR CONTROL 10ML LL (SYRINGE) ×2 IMPLANT
TOWEL OR 17X24 6PK STRL BLUE (TOWEL DISPOSABLE) ×2 IMPLANT
TOWEL OR 17X26 10 PK STRL BLUE (TOWEL DISPOSABLE) ×2 IMPLANT

## 2015-04-27 NOTE — Interval H&P Note (Signed)
History and Physical Interval Note:  04/27/2015 8:58 AM  Randall Bradley  has presented today for surgery, with the diagnosis of djd left knee  The various methods of treatment have been discussed with the patient and family. After consideration of risks, benefits and other options for treatment, the patient has consented to  Procedure(s): TOTAL KNEE ARTHROPLASTY (Left) as a surgical intervention .  The patient's history has been reviewed, patient examined, no change in status, stable for surgery.  I have reviewed the patient's chart and labs.  Questions were answered to the patient's satisfaction.     Randall Bradley

## 2015-04-27 NOTE — H&P (View-Only) (Signed)
Randall Bradley is a pleasant 59 year old active gentleman who presents to our clinic today with continued left knee pain. He has a history of endstage degenerative joint disease of the left knee. This has been ongoing for the past several years but over the past three months it has progressively worsened. 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 The pain in his left knee is located primarily on the lateral side. This is a constant ache with associated stiffness with instability and catching. He is having some rest and night pain. He has tried Aleve with minimal relief of his symptoms. He has failed cortisone injections in the past. His right knee is now starting to bother him a little most likely because he is favoring it due to the left.  At this point, he is ready to proceed with definitive treatment of a total knee replacement.  Past medical, social and family history reviewed in detail on the patient questionnaire and signed.  Review of systems: As detailed in the HPI. All others reviewed and are negative.  EXAMINATION: Well-developed, well-nourished male in no acute distress. Alert and oriented times three.  Examination of his left knee reveals a range of motion of 0-125 degrees. Moderate lateral greater than medial joint line tenderness. Moderate patellofemoral crepitus. Stable to varus and valgus test. Neurovascularly intact distally.  IMPRESSION:  Bilateral endstage degenerative joint disease.  PLAN:  At the end of the day Randall Bradley is well aware he will need bilateral sequential total knee replacements. He would like to proceed with the left knee first.  We discussed these will need to be six weeks apart. We have filled out paperwork to proceed with a left total knee replacement. The procedure, risks, benefits and complications were reviewed. Rehab and recovery time discussed. All questions were encouraged and answered. We will  see Randall Bradley just prior tooperative intervention.   Electronically verified by [name], M.D. DFM:(LS):jgc

## 2015-04-27 NOTE — Anesthesia Postprocedure Evaluation (Signed)
  Anesthesia Post-op Note  Patient: Randall Bradley  Procedure(s) Performed: Procedure(s): TOTAL KNEE ARTHROPLASTY (Left)  Patient Location: PACU  Anesthesia Type:General  Level of Consciousness: awake, alert  and oriented  Airway and Oxygen Therapy: Patient Spontanous Breathing and Patient connected to nasal cannula oxygen  Post-op Pain: mild  Post-op Assessment: Post-op Vital signs reviewed, Patient's Cardiovascular Status Stable, Respiratory Function Stable, Patent Airway and Pain level controlled LLE Motor Response: Purposeful movement, Responds to commands LLE Sensation: Full sensation          Post-op Vital Signs: stable  Last Vitals:  Filed Vitals:   04/27/15 1515  BP:   Pulse: 94  Temp:   Resp: 10    Complications: No apparent anesthesia complications

## 2015-04-27 NOTE — Progress Notes (Signed)
Orthopedic Tech Progress Note Patient Details:  Randall Bradley August 03, 1955 [id number]  CPM Left Knee CPM Left Knee: On Left Knee Flexion (Degrees): 90 Left Knee Extension (Degrees): 0 Additional Comments: trapeze bar patient helper Viewed order from doctor's order list  Hildred Priest 04/27/2015, 2:25 PM

## 2015-04-27 NOTE — Anesthesia Preprocedure Evaluation (Signed)
Anesthesia Evaluation  Patient identified by MRN, date of birth, ID band  Reviewed: Allergy & Precautions, NPO status , Patient's Chart, lab work & pertinent test results  Airway Mallampati: II  TM Distance: >3 FB Neck ROM: Full    Dental  (+) Teeth Intact, Dental Advisory Given   Pulmonary former smoker,    breath sounds clear to auscultation       Cardiovascular hypertension,  Rhythm:Regular Rate:Normal     Neuro/Psych    GI/Hepatic   Endo/Other    Renal/GU      Musculoskeletal   Abdominal   Peds  Hematology   Anesthesia Other Findings   Reproductive/Obstetrics                             Anesthesia Physical Anesthesia Plan  ASA: II  Anesthesia Plan: General   Post-op Pain Management:    Induction: Intravenous  Airway Management Planned: LMA  Additional Equipment:   Intra-op Plan:   Post-operative Plan:   Informed Consent: I have reviewed the patients History and Physical, chart, labs and discussed the procedure including the risks, benefits and alternatives for the proposed anesthesia with the patient or authorized representative who has indicated his/her understanding and acceptance.   Dental advisory given  Plan Discussed with: CRNA and Anesthesiologist  Anesthesia Plan Comments: (DJD L. Knee Hemochromatosis well controlled Htn H/O prostate Ca S/P prostatectomy  Plan GA with LMA   Roberts Gaudy )        Anesthesia Quick Evaluation

## 2015-04-27 NOTE — Transfer of Care (Signed)
Immediate Anesthesia Transfer of Care Note  Patient: Randall Bradley  Procedure(s) Performed: Procedure(s): TOTAL KNEE ARTHROPLASTY (Left)  Patient Location: PACU  Anesthesia Type:General  Level of Consciousness: sedated  Airway & Oxygen Therapy: Patient Spontanous Breathing and Patient connected to nasal cannula oxygen  Post-op Assessment: Report given to RN and Post -op Vital signs reviewed and stable  Post vital signs: stable  Last Vitals:  Filed Vitals:   04/27/15 0908  BP: 131/98  Pulse: 75  Temp: 36.4 C  Resp: 18    Complications: No apparent anesthesia complications

## 2015-04-27 NOTE — Anesthesia Procedure Notes (Signed)
Procedure Name: LMA Insertion Date/Time: 04/27/2015 11:21 AM Performed by: Barkley Boards L Pre-anesthesia Checklist: Patient identified, Emergency Drugs available, Suction available and Patient being monitored Patient Re-evaluated:Patient Re-evaluated prior to inductionOxygen Delivery Method: Circle system utilized Preoxygenation: Pre-oxygenation with 100% oxygen Intubation Type: IV induction Ventilation: Mask ventilation without difficulty LMA: LMA inserted LMA Size: 5.0 Number of attempts: 1 Placement Confirmation: positive ETCO2,  CO2 detector and breath sounds checked- equal and bilateral Tube secured with: Tape Dental Injury: Teeth and Oropharynx as per pre-operative assessment

## 2015-04-28 ENCOUNTER — Encounter (HOSPITAL_COMMUNITY): Payer: Self-pay | Admitting: Orthopedic Surgery

## 2015-04-28 LAB — BASIC METABOLIC PANEL
Anion gap: 10 (ref 5–15)
BUN: 13 mg/dL (ref 6–20)
CO2: 25 mmol/L (ref 22–32)
CREATININE: 0.95 mg/dL (ref 0.61–1.24)
Calcium: 8.4 mg/dL — ABNORMAL LOW (ref 8.9–10.3)
Chloride: 103 mmol/L (ref 101–111)
Glucose, Bld: 139 mg/dL — ABNORMAL HIGH (ref 65–99)
POTASSIUM: 4.3 mmol/L (ref 3.5–5.1)
SODIUM: 138 mmol/L (ref 135–145)

## 2015-04-28 LAB — CBC
HCT: 35.2 % — ABNORMAL LOW (ref 39.0–52.0)
Hemoglobin: 12 g/dL — ABNORMAL LOW (ref 13.0–17.0)
MCH: 29.5 pg (ref 26.0–34.0)
MCHC: 34.1 g/dL (ref 30.0–36.0)
MCV: 86.5 fL (ref 78.0–100.0)
Platelets: 155 K/uL (ref 150–400)
RBC: 4.07 MIL/uL — ABNORMAL LOW (ref 4.22–5.81)
RDW: 12.9 % (ref 11.5–15.5)
WBC: 12.2 K/uL — ABNORMAL HIGH (ref 4.0–10.5)

## 2015-04-28 MED ORDER — DOCUSATE SODIUM 100 MG PO CAPS: ORAL_CAPSULE | ORAL | Status: DC

## 2015-04-28 MED ORDER — OXYCODONE HCL 5 MG PO TABS: ORAL_TABLET | ORAL | Status: DC

## 2015-04-28 MED ORDER — POLYETHYLENE GLYCOL 3350 17 G PO PACK: PACK | ORAL | Status: DC

## 2015-04-28 MED ORDER — APIXABAN 2.5 MG PO TABS: ORAL_TABLET | ORAL | Status: DC

## 2015-04-28 NOTE — Progress Notes (Signed)
Utilization review completed. Elena Davia, RN, BSN. 

## 2015-04-28 NOTE — Progress Notes (Signed)
Orthopedic Tech Progress Note Patient Details:  GIANPAOLO MINDEL [birth year] [id number]  Patient ID: Addison Naegeli, male   DOB: 1956-03-22, 59 y.o.   MRN: [id number] Placed pt's lle on cpm @0 -90 degrees @1325   Hildred Priest 04/28/2015, 1:38 PM

## 2015-04-28 NOTE — Care Management Note (Signed)
Case Management Note  Patient Details  Name: Randall Bradley MRN: [id number] Date of Birth: 04/01/1956  Subjective/Objective:   59 yr old male s/p left total knee arthroplasty.                 Action/Plan:  Case manager spoke with patient concerning home health and DME needs at discharge. Patient states he will be staying local : [facility] [phone]. Patient was preoperatively setup with [place], no changes. He will have assistance at discharge.                                                                                                                                                                                                                                                                              Expected Discharge Date:                  Expected Discharge Plan:     In-House Referral:  NA  Discharge planning Services  CM Consult  Post Acute Care Choice:    Choice offered to:     DME Arranged:  3-N-1, Walker rolling, CPM DME Agency:  [employer]  HH Arranged:  PT [place] Agency:  [facility]  Status of Service:  Completed, signed off  Medicare Important Message Given:    Date Medicare IM Given:    Medicare IM give by:    Date Additional Medicare IM Given:    Additional Medicare Important Message give by:     If discussed at [place] of Stay Meetings, dates discussed:    Additional Comments:  Ninfa Meeker, RN 04/28/2015, 2:15 PM

## 2015-04-28 NOTE — Progress Notes (Signed)
Physical Therapy Treatment Patient Details Name: Randall Bradley MRN: [id number] DOB: [birth year] Today's Date: 04/28/2015    History of Present Illness 59 y.o. male s/p Lt TKA. PMH: hypertension, cancer    PT Comments    Patient is making good progress with PT.  From a mobility standpoint anticipate patient will be ready for DC home with friend support. Patient denies any questions or concerns at this time.      Follow Up Recommendations  Home health PT;Supervision for mobility/OOB     Equipment Recommendations  None recommended by PT    Recommendations for Other Services       Precautions / Restrictions Precautions Precautions: Knee Precaution Booklet Issued: Yes (comment) Precaution Comments: HEP provided and no pillow under knee reviewed Restrictions Weight Bearing Restrictions: Yes LLE Weight Bearing: Weight bearing as tolerated Other Position/Activity Restrictions: PA-C confirming no need for immobilizer with ambulation.    Mobility  Bed Mobility Overal bed mobility: Independent Bed Mobility: Sit to Supine            Transfers Overall transfer level: Modified independent Equipment used: Rolling walker (2 wheeled) Transfers: Sit to/from Stand Sit to Stand: Modified independent (Device/Increase time)         General transfer comment: safe technique demonstrated.   Ambulation/Gait Ambulation/Gait assistance: Supervision Ambulation Distance (Feet): 400 Feet Assistive device: Rolling walker (2 wheeled) Gait Pattern/deviations: Step-through pattern Gait velocity: decreased slightly   General Gait Details: no instability noted.    Stairs Stairs: Yes Stairs assistance: Supervision Stair Management: One rail Right;Alternating pattern;Step to pattern Number of Stairs: 5 General stair comments: stable pattern, alternating on shallow steps and step to with deeper steps.   Wheelchair Mobility    Modified Rankin (Stroke Patients Only)        Balance Overall balance assessment: Needs assistance Sitting-balance support: No upper extremity supported Sitting balance-Leahy Scale: Normal     Standing balance support: During functional activity Standing balance-Leahy Scale: Fair Standing balance comment: using rw                    Cognition Arousal/Alertness: Awake/alert Behavior During Therapy: WFL for tasks assessed/performed Overall Cognitive Status: Within Functional Limits for tasks assessed                      Exercises     General Comments       Pertinent Vitals/Pain Pain Assessment: 0-10 Pain Score: 3  Pain Location: lt knee Pain Descriptors / Indicators: Aching Pain Intervention(s): Limited activity within patient's tolerance;Monitored during session    Home Living          Prior Function Level of Independence: Independent          PT Goals (current goals can now be found in the care plan section) Acute Rehab PT Goals Patient Stated Goal: go home PT Goal Formulation: With patient Time For Goal Achievement: 05/12/15 Potential to Achieve Goals: Good Progress towards PT goals: Progressing toward goals    Frequency  7X/week    PT Plan Current plan remains appropriate    Co-evaluation             End of Session Equipment Utilized During Treatment: Gait belt Activity Tolerance: Patient tolerated treatment well Patient left: in bed;with call bell/phone within reach;in CPM;with nursing/sitter in room     Time: 1340-1404 PT Time Calculation (min) (ACUTE ONLY): 24 min  Charges:  $Gait Training: 23-37 mins  G Codes:      Cassell Clement, PT, CSCS Pager [phone] Office [phone]  04/28/2015, 2:22 PM

## 2015-04-28 NOTE — Evaluation (Signed)
Occupational Therapy Evaluation Patient Details Name: Randall Bradley MRN: [id number] DOB: March 24, 1956 Today's Date: 04/28/2015    History of Present Illness 59 y.o. male s/p Lt TKA. PMH: hypertension, cancer   Clinical Impression   Pt reports he was independent with ADLs PTA. Currently pt is overall supervision for ADLs and functional mobility. All education completed and pt verbalized understanding; pt with no further questions or concerns for OT at this time. Pt plan to d/c home with 24/7 supervision. Pt ready to d/c from an OT standpoint; signing off at this time. Thank you for this referral.     Follow Up Recommendations  No OT follow up;Supervision - Intermittent    Equipment Recommendations  None recommended by OT    Recommendations for Other Services       Precautions / Restrictions Precautions Precautions: Knee Precaution Booklet Issued: Yes (comment) Precaution Comments: HEP provided and no pillow under knee reviewed Restrictions Weight Bearing Restrictions: Yes LLE Weight Bearing: Weight bearing as tolerated Other Position/Activity Restrictions: PA-C confirming no need for immobilizer with ambulation.      Mobility Bed Mobility Overal bed mobility: Needs Assistance Bed Mobility: Sit to Supine       Sit to supine: Supervision   General bed mobility comments: Supervision for safety. No physical assist needed. Good technique.  Transfers Overall transfer level: Needs assistance Equipment used: Rolling walker (2 wheeled) Transfers: Sit to/from Stand Sit to Stand: Supervision         General transfer comment: Supervision for safety. VC for hand placement.    Balance Overall balance assessment: Needs assistance Sitting-balance support: Feet supported;No upper extremity supported Sitting balance-Leahy Scale: Good     Standing balance support: Bilateral upper extremity supported Standing balance-Leahy Scale: Fair Standing balance comment: RW for  support                            ADL Overall ADL's : Needs assistance/impaired Eating/Feeding: Set up;Sitting   Grooming: Supervision/safety;Standing       Lower Body Bathing: Supervison/ safety;Sit to/from stand       Lower Body Dressing: Supervision/safety;Sit to/from stand Lower Body Dressing Details (indicate cue type and reason): Educated on compensatory strategies for LB ADLs; pt verbalized understanding. Pt able to doff/don B socks Toilet Transfer: Supervision/safety;Ambulation;BSC;RW (BSC over toilet) Toilet Transfer Details (indicate cue type and reason): Educated pt on use of 3 in 1 over toilet; pt verbalized understanding.       Tub/Shower Transfer Details (indicate cue type and reason): Educated and demonstrated tub transfer technique with shower chair; pt verbalized understanding. Declined to practice at this time but feels comfortable performing at home. Functional mobility during ADLs: Supervision/safety;Rolling walker General ADL Comments: Friend present for OT eval. Educated pt on need for close supervision during ADLs and functional mobility, safety with RW, home safety; pt verbalized understanding.     Vision     Perception     Praxis      Pertinent Vitals/Pain Pain Assessment: 0-10 Pain Score: 3  Pain Location: L knee Pain Descriptors / Indicators: Sore Pain Intervention(s): Limited activity within patient's tolerance;Monitored during session;Repositioned;Ice applied     Hand Dominance     Extremity/Trunk Assessment Upper Extremity Assessment Upper Extremity Assessment: Overall WFL for tasks assessed   Lower Extremity Assessment Lower Extremity Assessment: Defer to PT evaluation LLE Deficits / Details: able to perform SLR independently   Cervical / Trunk Assessment Cervical / Trunk Assessment: Normal  Communication Communication Communication: No difficulties   Cognition Arousal/Alertness: Awake/alert Behavior During  Therapy: WFL for tasks assessed/performed Overall Cognitive Status: Within Functional Limits for tasks assessed                     General Comments       Exercises Exercises: Total Joint     Shoulder Instructions      Home Living Family/patient expects to be discharged to:: Private residence Living Arrangements: Alone Available Help at Discharge: Friend(s) Type of Home: House Home Access: Stairs to enter [employer] of Steps: 1   Home Layout: One level     Bathroom Shower/Tub: Tub/shower unit Shower/tub characteristics: Architectural technologist: Standard Bathroom Accessibility: Yes How Accessible: Accessible via walker Home Equipment: [place] - 2 wheels;Bedside commode;Shower seat          Prior Functioning/Environment Level of Independence: Independent             OT Diagnosis: Acute pain   OT Problem List:     OT Treatment/Interventions:      OT Goals(Current goals can be found in the care plan section) Acute Rehab OT Goals Patient Stated Goal: go home OT Goal Formulation: With patient  OT Frequency:     Barriers to D/C:            Co-evaluation              End of Session Equipment Utilized During Treatment: Rolling walker CPM Left Knee CPM Left Knee: Off  Activity Tolerance: Patient tolerated treatment well Patient left: in bed;with call bell/phone within reach;with family/visitor present   Time: 0370-4888 OT Time Calculation (min): 13 min Charges:  OT General Charges $OT Visit: 1 Procedure OT Evaluation $Initial OT Evaluation Tier I: 1 Procedure G-Codes:     Binnie Kand M.S., OTR/L Pager: [phone]  04/28/2015, 11:26 AM

## 2015-04-28 NOTE — Evaluation (Signed)
Physical Therapy Evaluation Patient Details Name: Randall Bradley MRN: [id number] DOB: 1955/08/28 Today's Date: 04/28/2015   History of Present Illness  59 y.o. male s/p Lt TKA. PMH: hypertension, cancer  Clinical Impression  Pt is s/p TKA resulting in the deficits listed below (see PT Problem List).  Pt will benefit from skilled PT to increase their independence and safety with mobility to allow discharge to the venue listed below. Patient recently back from ambulation with PA-C, will attempt ambulation at next session. Focus on session was on HEP and activity at home. Will progress for independence and anticipated D/C home.      Follow Up Recommendations Home health PT;Supervision for mobility/OOB    Equipment Recommendations  None recommended by PT;Other (comment) (patient reports having equipment at home (walker))    Recommendations for Other Services       Precautions / Restrictions Precautions Precautions: Knee Precaution Booklet Issued: Yes (comment) Precaution Comments: HEP provided and no pillow under knee reviewed Restrictions Weight Bearing Restrictions: Yes LLE Weight Bearing: Weight bearing as tolerated Other Position/Activity Restrictions: PA-C confirming no need for immobilizer with ambulation.      Mobility  Bed Mobility               General bed mobility comments: up in chair upon arrival  Transfers                 General transfer comment: not attempted  Ambulation/Gait                Stairs            Wheelchair Mobility    Modified Rankin (Stroke Patients Only)       Balance Overall balance assessment: Needs assistance Sitting-balance support: No upper extremity supported Sitting balance-Leahy Scale: Good                                       Pertinent Vitals/Pain Pain Assessment: 0-10 Pain Score: 2  Pain Location: Lt knee Pain Descriptors / Indicators: Sore Pain Intervention(s): Limited  activity within patient's tolerance;Monitored during session;Ice applied    Home Living Family/patient expects to be discharged to:: Private residence Living Arrangements: Alone Available Help at Discharge: Friend(s) Type of Home: House Home Access: Stairs to enter   Technical brewer of Steps: 1 Home Layout: One level Home Equipment: Environmental consultant - 2 wheels      Prior Function Level of Independence: Independent               Hand Dominance        Extremity/Trunk Assessment   Upper Extremity Assessment: Defer to OT evaluation           Lower Extremity Assessment: LLE deficits/detail   LLE Deficits / Details: able to perform SLR independently     Communication   Communication: No difficulties  Cognition Arousal/Alertness: Awake/alert Behavior During Therapy: WFL for tasks assessed/performed Overall Cognitive Status: Within Functional Limits for tasks assessed                      General Comments General comments (skin integrity, edema, etc.): Patient just back from ambulation with PA-C including stairs.     Exercises Total Joint Exercises Ankle Circles/Pumps: AROM;Both;15 reps Quad Sets: Strengthening;Left;10 reps Towel Squeeze: Strengthening;Both;5 reps Short Arc Quad: Strengthening;Left;10 reps Heel Slides: AAROM;Left;10 reps Hip ABduction/ADduction: Strengthening;Left;10 reps Straight Leg Raises: Strengthening;Left;10 reps Long  Arc Quad: Strengthening;Left;10 reps Knee Flexion: AROM;Left;Seated;10 reps Goniometric ROM: 90 degrees flexion      Assessment/Plan    PT Assessment Patient needs continued PT services  PT Diagnosis Difficulty walking;Acute pain   PT Problem List Decreased strength;Decreased range of motion;Decreased activity tolerance;Decreased balance;Decreased mobility;Decreased coordination;Pain  PT Treatment Interventions DME instruction;Gait training;Stair training;Functional mobility training;Therapeutic  activities;Therapeutic exercise;Balance training;Patient/family education   PT Goals (Current goals can be found in the Care Plan section) Acute Rehab PT Goals Patient Stated Goal: go home PT Goal Formulation: With patient Time For Goal Achievement: 05/12/15 Potential to Achieve Goals: Good    Frequency 7X/week   Barriers to discharge        Co-evaluation               End of Session   Activity Tolerance: Patient tolerated treatment well Patient left: in chair;with call bell/phone within reach;with family/visitor present;Other (comment) (in bone foam) Nurse Communication: Mobility status         Time: 1020-1054 PT Time Calculation (min) (ACUTE ONLY): 34 min   Charges:   PT Evaluation $Initial PT Evaluation Tier I: 1 Procedure PT Treatments $Therapeutic Exercise: 8-22 mins   PT G Codes:        Cassell Clement, PT, CSCS Pager [phone] Office [phone]  04/28/2015, 11:07 AM

## 2015-04-28 NOTE — Op Note (Signed)
NAME:  Randall Bradley, Randall Bradley NO.:  [id number]  MEDICAL RECORD NO.:  [id number]  LOCATION:  [id number]                        FACILITY:  [facility]  PHYSICIAN:  [name], M.D. DATE OF BIRTH:  [birth year]  DATE OF PROCEDURE:  04/27/2015 DATE OF DISCHARGE:                              OPERATIVE REPORT   PREOPERATIVE DIAGNOSES:  Left knee end-stage arthritis, primary generalized.  Some bone loss laterally.  Previous open reduction and internal fixation, tibial plateau fracture with hardware previously removed.  POSTOPERATIVE DIAGNOSES:  Left knee end-stage arthritis, primary generalized.  Some bone loss laterally.  Previous open reduction and internal fixation, tibial plateau fracture with hardware previously removed with marked bluish discoloration of all of his bone from underlying hemochromatosis.  PROCEDURE:  Modified minimally invasive left total knee replacement utilizing Stryker Triathlon prosthesis.  Soft tissue balancing. Cemented pegged posterior stabilized #5 femoral component.  Cemented #6 tibial component, 9 mm CS insert.  Cemented resurfacing pegged medial offset 38 mm patellar component.  SURGEON:  [name], M.D.  ASSISTANT:  Lowell Guitar. Mancel Bale, [place], present throughout the entire case and necessary for timely completion of procedure.  ANESTHESIA:  General.  BLOOD LOSS:  Minimal.  SPECIMENS:  Excised bone was sent to Pathology because of the marked discoloration.  DRAINS:  None.  TOURNIQUET TIME:  One hour.  DESCRIPTION OF PROCEDURE:  The patient was brought to operating room, placed on the operating table in a supine position.  After adequate anesthesia had been obtained, tourniquet applied.  Prepped and draped in usual sterile fashion.  Exsanguinated with elevation of Esmarch. Tourniquet inflated to 350 mmHg.  Utilizing part of his previous incision, longitudinal incision was made from above the patella down to the tibial tubercle.  Skin  and subcutaneous tissue divided.  Medial arthrotomy, vastus splitting.  Fair amount of adhesions and scar tissue debrided.  Marked discoloration of all the bone appreciated.  The distal cuts were sent for specimen, although I was fairly sure this was all discoloration from hemochromatosis.  Intramedullary guide distal femur, 8 mm resection 5 degrees of valgus.  Using epicondylar axis, the femur was sized, cut, and fitted for a posterior stabilized pegged #5 femoral component.  Proximal tibial resection with extramedullary guide.  A 3- degree posterior slope cut.  Patella exposed.  Posterior 10 mm removed. Drilled, sized, and fitted for a 38-mm component.  Trials put in place. #5 on the femur, #6 on tibia, 9 mm posterior stabilized insert, and a 38 patella.  With this construct, I was very pleased with balancing, stability, flexion, extension, patellar tracking.  Tibia was marked for rotation and reamed.  All trials were removed.  Copious irrigation with pulse irrigating device.  Cement prepared, placed on all components. Polyethylene attached to tibia, knee reduced.  Patella held with a clamp.  Once cement hardened, the knee was irrigated again.  Soft tissues were injected with Exparel.  Arthrotomy closed with #1 Vicryl subcutaneous and subcuticular closure.  Margins were injected with Marcaine.  Sterile compressive dressing applied.  Tourniquet deflated and removed.  Anesthesia reversed.  Brought to the recovery room. Tolerated the surgery well.  No complications.     Quillian Quince  Dennie Bible, M.D.     DFM/MEDQ  D:  04/27/2015  T:  04/28/2015  Job:  [id number]

## 2015-04-29 NOTE — Discharge Summary (Signed)
Patient ID: Randall Bradley MRN: [id number] DOB/AGE: [birth year] 59 y.o.  Admit date: 04/27/2015 Discharge date: 04/29/2015  Admission Diagnoses:  Active Problems:   DJD (degenerative joint disease) of knee   Discharge Diagnoses:  Same  Past Medical History  Diagnosis Date  . History of bladder stone   . Right ureteral stone   . History of prostate cancer UROLOGIST-- DR DAHLSTEDT    S/P  PROSTATECTOMY 03-17-2008  . Hereditary hemochromatosis ([place]) monitored by PCP  dr [name] gates    history of phlebotomies -- last one 2014 (pt states body has normalized)  . Hematuria   . Rosacea   . Alopecia   . Hypertension   . History of kidney stones   . Arthritis   . Cancer [facility])     Prostate    Surgeries: Procedure(s): TOTAL KNEE ARTHROPLASTY on 04/27/2015   Consultants:    Discharged Condition: Improved  Hospital Course: Randall Bradley is an 59 y.o. male who was admitted 04/27/2015 for operative treatment of<principal problem not specified>. Patient has severe unremitting pain that affects sleep, daily activities, and work/hobbies. After pre-op clearance the patient was taken to the operating room on 04/27/2015 and underwent  Procedure(s): TOTAL KNEE ARTHROPLASTY.    Patient was given perioperative antibiotics: Anti-infectives    Start     Dose/Rate Route Frequency Ordered Stop   04/27/15 1800  minocycline (MINOCIN,DYNACIN) capsule 100 mg  Status:  Discontinued     100 mg Oral Every evening 04/27/15 1656 04/28/15 2016   04/27/15 1700  ceFAZolin (ANCEF) IVPB 2 g/50 mL premix     2 g 100 mL/hr over 30 Minutes Intravenous Every 6 hours 04/27/15 1656 04/28/15 0105   04/27/15 1030  ceFAZolin (ANCEF) IVPB 2 g/50 mL premix     2 g 100 mL/hr over 30 Minutes Intravenous To [facility]Stay [facility] 04/26/15 1404 04/27/15 1126       Patient was given sequential compression devices, early ambulation, and chemoprophylaxis to prevent DVT.  Patient benefited maximally from hospital stay and  there were no complications.    Recent vital signs: No data found.    Recent laboratory studies:  Recent Labs  04/28/15 0608  WBC 12.2*  HGB 12.0*  HCT 35.2*  PLT 155  NA 138  K 4.3  CL 103  CO2 25  BUN 13  CREATININE 0.95  GLUCOSE 139*  CALCIUM 8.4*     Discharge Medications:     Medication List    STOP taking these medications        aspirin EC 81 MG tablet     Fish Oil 1000 MG Caps     naproxen sodium 220 MG tablet  Commonly known as:  ANAPROX      TAKE these medications        apixaban 2.5 MG Tabs tablet  Commonly known as:  ELIQUIS  1 tablet twice a day for 2 weeks.   BLOOD THINNER.  Patient has this script already filled at home     chlorpheniramine 4 MG tablet  Commonly known as:  CHLOR-TRIMETON  Take 4 mg by mouth 2 (two) times daily as needed for allergies.     docusate sodium 100 MG capsule  Commonly known as:  COLACE  1 tab 2 times a day while on narcotics.  STOOL SOFTENER     finasteride 1 MG tablet  Commonly known as:  PROPECIA  Take 1 mg by mouth daily.     losartan 50 MG tablet  Commonly  known as:  COZAAR  Take 50 mg by mouth daily.     minocycline 100 MG capsule  Commonly known as:  MINOCIN,DYNACIN  Take 100 mg by mouth every evening.     multivitamin with minerals Tabs tablet  Take 1 tablet by mouth daily.     oxyCODONE 5 MG immediate release tablet  Commonly known as:  Oxy IR/ROXICODONE  1-2 tablets every 4-6 hrs as needed for pain.  Patient has already filled a script for this medicine     polyethylene glycol packet  Commonly known as:  MIRALAX / GLYCOLAX  17grams in 16 oz of water twice a day until bowel movement.  LAXITIVE.  Restart if two days since last bowel movement        Diagnostic Studies: Dg Knee Left Port  04/27/2015  CLINICAL DATA:  59 year old male status post left total knee arthroplasty. EXAM: PORTABLE LEFT KNEE - 1-2 VIEW COMPARISON:  No priors. FINDINGS: Two views of the left knee demonstrate  postoperative changes of left total knee arthroplasty. The femoral and tibial components of the prosthesis appear properly seated without definite periprosthetic fracture or other acute abnormalities. Gas is present in the joint space and the overlying soft tissues. IMPRESSION: 1. Expected postoperative findings following left total knee arthroplasty, without acute complicating features, as above. Electronically Signed   By: Vinnie Langton M.D.   On: 04/27/2015 14:05    Disposition: 01-Home or Self Care      Discharge Instructions    CPM    Complete by:  As directed   Continuous passive motion machine (CPM):      Use the CPM from 0 to 90 for 6 hours per day.       You may break it up into 2 or 3 sessions per day.      Use CPM for 2 weeks or until you are told to stop.     Call MD / Call 911    Complete by:  As directed   If you experience chest pain or shortness of breath, CALL 911 and be transported to the hospital emergency room.  If you develope a fever above 101 F, pus (white drainage) or increased drainage or redness at the wound, or calf pain, call your surgeon's office.     Change dressing    Complete by:  As directed   Change the gauze dressing daily with sterile 4 x 4 inch gauze and apply TED hose.  DO NOT REMOVE BANDAGE OVER SURGICAL INCISION.  [place] WHOLE LEG INCLUDING OVER THE WATERPROOF BANDAGE WITH SOAP AND WATER EVERY DAY.     Constipation Prevention    Complete by:  As directed   Drink plenty of fluids.  Prune juice may be helpful.  You may use a stool softener, such as Colace (over the counter) 100 mg twice a day.  Use MiraLax (over the counter) for constipation as needed.     Diet - low sodium heart healthy    Complete by:  As directed      Discharge instructions    Complete by:  As directed   INSTRUCTIONS AFTER JOINT REPLACEMENT   Remove items at home which could result in a fall. This includes throw rugs or furniture in walking pathways ICE to the affected joint  every three hours while awake for 30 minutes at a time, for at least the first 3-5 days, and then as needed for pain and swelling.  Continue to use ice for pain and  swelling. You may notice swelling that will progress down to the foot and ankle.  This is normal after surgery.  Elevate your leg when you are not up walking on it.   Continue to use the breathing machine you got in the hospital (incentive spirometer) which will help keep your temperature down.  It is common for your temperature to cycle up and down following surgery, especially at night when you are not up moving around and exerting yourself.  The breathing machine keeps your lungs expanded and your temperature down.   DIET:  As you were doing prior to hospitalization, we recommend a well-balanced diet.  DRESSING / WOUND CARE / SHOWERING  Keep the surgical dressing until follow up.  The dressing is water proof, so you can shower without any extra covering.  IF THE DRESSING FALLS OFF or the wound gets wet inside, change the dressing with sterile gauze.  Please use good hand washing techniques before changing the dressing.  Do not use any lotions or creams on the incision until instructed by your surgeon.    ACTIVITY  Increase activity slowly as tolerated, but follow the weight bearing instructions below.   No driving for 6 weeks or until further direction given by your physician.  You cannot drive while taking narcotics.  No lifting or carrying greater than 10 lbs. until further directed by your surgeon. Avoid periods of inactivity such as sitting longer than an hour when not asleep. This helps prevent blood clots.  You may return to work once you are authorized by your doctor.     WEIGHT BEARING   Weight bearing as tolerated with assist device (walker, cane, etc) as directed, use it as long as suggested by your surgeon or therapist, typically at least 1-2 weeks.   EXERCISES  Results after joint replacement surgery are often  greatly improved when you follow the exercise, range of motion and muscle strengthening exercises prescribed by your doctor. Safety measures are also important to protect the joint from further injury. Any time any of these exercises cause you to have increased pain or swelling, decrease what you are doing until you are comfortable again and then slowly increase them. If you have problems or questions, call your caregiver or physical therapist for advice.   Rehabilitation is important following a joint replacement. After just a few days of immobilization, the muscles of the leg can become weakened and shrink (atrophy).  These exercises are designed to build up the tone and strength of the thigh and leg muscles and to improve motion. Often times heat used for twenty to thirty minutes before working out will loosen up your tissues and help with improving the range of motion but do not use heat for the first two weeks following surgery (sometimes heat can increase post-operative swelling).   These exercises can be done on a training (exercise) mat, on the floor, on a table or on a bed. Use whatever works the best and is most comfortable for you.    Use music or television while you are exercising so that the exercises are a pleasant break in your day. This will make your life better with the exercises acting as a break in your routine that you can look forward to.   Perform all exercises about fifteen times, three times per day or as directed.  You should exercise both the operative leg and the other leg as well.   Exercises include:   Quad Sets - Tighten up the  muscle on the front of the thigh (Quad) and hold for 5-10 seconds.   Straight Leg Raises - With your knee straight (if you were given a brace, keep it on), lift the leg to 60 degrees, hold for 3 seconds, and slowly lower the leg.  Perform this exercise against resistance later as your leg gets stronger.  Leg Slides: Lying on your back, slowly slide  your foot toward your buttocks, bending your knee up off the floor (only go as far as is comfortable). Then slowly slide your foot back down until your leg is flat on the floor again.  Angel Wings: Lying on your back spread your legs to the side as far apart as you can without causing discomfort.  Hamstring Strength:  Lying on your back, push your heel against the floor with your leg straight by tightening up the muscles of your buttocks.  Repeat, but this time bend your knee to a comfortable angle, and push your heel against the floor.  You may put a pillow under the heel to make it more comfortable if necessary.   A rehabilitation program following joint replacement surgery can speed recovery and prevent re-injury in the future due to weakened muscles. Contact your doctor or a physical therapist for more information on knee rehabilitation.    CONSTIPATION  Constipation is defined medically as fewer than three stools per week and severe constipation as less than one stool per week.  Even if you have a regular bowel pattern at home, your normal regimen is likely to be disrupted due to multiple reasons following surgery.  Combination of anesthesia, postoperative narcotics, change in appetite and fluid intake all can affect your bowels.   YOU MUST use at least one of the following options; they are listed in order of increasing strength to get the job done.  They are all available over the counter, and you may need to use some, POSSIBLY even all of these options:    Drink plenty of fluids (prune juice may be helpful) and high fiber foods Colace 100 mg by mouth twice a day  Senokot for constipation as directed and as needed Dulcolax (bisacodyl), take with full glass of water  Miralax (polyethylene glycol) once or twice a day as needed.  If you have tried all these things and are unable to have a bowel movement in the first 3-4 days after surgery call either your surgeon or your primary doctor.    If  you experience loose stools or diarrhea, hold the medications until you stool forms back up.  If your symptoms do not get better within 1 week or if they get worse, check with your doctor.  If you experience "the worst abdominal pain ever" or develop nausea or vomiting, please contact the office immediately for further recommendations for treatment.   ITCHING:  If you experience itching with your medications, try taking only a single pain pill, or even half a pain pill at a time.  You can also use Benadryl over the counter for itching or also to help with sleep.   TED HOSE STOCKINGS:  Use stockings on both legs until for at least 2 weeks or as directed by physician office. They may be removed at night for sleeping.  MEDICATIONS:  See your medication summary on the "After Visit Summary" that nursing will review with you.  You may have some home medications which will be placed on hold until you complete the course of blood thinner medication.  It  is important for you to complete the blood thinner medication as prescribed.  PRECAUTIONS:  If you experience chest pain or shortness of breath - call 911 immediately for transfer to the hospital emergency department.   If you develop a fever greater that 101 F, purulent drainage from wound, increased redness or drainage from wound, foul odor from the wound/dressing, or calf pain - CONTACT YOUR SURGEON.                                                   FOLLOW-UP APPOINTMENTS:  If you do not already have a post-op appointment, please call the office for an appointment to be seen by your surgeon.  Guidelines for how soon to be seen are listed in your "After Visit Summary", but are typically between 1-4 weeks after surgery.  OTHER INSTRUCTIONS:   Knee Replacement:  Do not place pillow under knee, focus on keeping the knee straight while resting. CPM instructions: 0-90 degrees, 2 hours in the morning, 2 hours in the afternoon, and 2 hours in the evening. Place  foam block, curve side up under heel at all times except when in CPM or when walking.  DO NOT modify, tear, cut, or change the foam block in any way.  MAKE SURE YOU:  Understand these instructions.  Get help right away if you are not doing well or get worse.    Thank you for letting us be a part of your medical care team.  It is a privilege we respect greatly.  We hope these instructions will help you stay on track for a fast and full recovery!     Do not put a pillow under the knee. Place it under the heel.    Complete by:  As directed   Place gray foam block, curve side up under heel at all times except when in CPM or when walking.  DO NOT modify, tear, cut, or change in any way the gray foam block.     Increase activity slowly as tolerated    Complete by:  As directed      TED hose    Complete by:  As directed   Use stockings (TED hose) for 2 weeks on both leg(s).  You may remove them at night for sleeping.           Follow-up Information    Follow up with [facility].   Why:  Someone from [facility] will contact you concerning start date and time for therapy.   Contact information:   [address] [place] [address] [phone]        Signed: Linda Hedges 04/29/2015, 10:04 AM

## 2015-05-11 ENCOUNTER — Other Ambulatory Visit (HOSPITAL_COMMUNITY): Payer: Self-pay | Admitting: Internal Medicine

## 2015-05-11 DIAGNOSIS — R000 Tachycardia, unspecified: Secondary | ICD-10-CM

## 2015-05-23 ENCOUNTER — Ambulatory Visit (HOSPITAL_COMMUNITY): Payer: BLUE CROSS/BLUE SHIELD

## 2015-05-24 ENCOUNTER — Other Ambulatory Visit: Payer: Self-pay

## 2015-05-24 ENCOUNTER — Other Ambulatory Visit: Payer: Self-pay | Admitting: Physician Assistant

## 2015-05-24 ENCOUNTER — Ambulatory Visit (HOSPITAL_COMMUNITY): Payer: BLUE CROSS/BLUE SHIELD | Attending: Cardiovascular Disease

## 2015-05-24 DIAGNOSIS — I517 Cardiomegaly: Secondary | ICD-10-CM | POA: Diagnosis not present

## 2015-05-24 DIAGNOSIS — R000 Tachycardia, unspecified: Secondary | ICD-10-CM | POA: Insufficient documentation

## 2015-05-24 DIAGNOSIS — I371 Nonrheumatic pulmonary valve insufficiency: Secondary | ICD-10-CM | POA: Diagnosis not present

## 2015-05-24 DIAGNOSIS — I071 Rheumatic tricuspid insufficiency: Secondary | ICD-10-CM | POA: Diagnosis not present

## 2015-05-24 DIAGNOSIS — I340 Nonrheumatic mitral (valve) insufficiency: Secondary | ICD-10-CM | POA: Diagnosis not present

## 2015-05-24 NOTE — H&P (Signed)
Chief complaint: HPI.  This is a 59 year-old male with a history of right knee end stage degenerative joint disease and chronic pain.  Returns for follow up.  Symptoms are unchanged from his previous visits and he wishes to proceed with right total knee replacement as scheduled.  He has significant pain with activities affecting his quality of life and ability to perform ADLs.  He has failed to respond to conservative treatment options.   Current medications: Minocycline, Losartan, Finasteride, Cyproheptadine, Aleve and Omega-3 fish oil.  Allergies: No known drug allergies. Past medical history: Significant for glasses, hypertension, Hepatitis B, prostate cancer which is not active and kidney stones. Past surgical history: Significant for left knee arthroscopy and left total knee replacement, radical prostatectomy and kidney stone ablation.   Review of systems: Patient currently denies lightheadedness, dizziness, fevers, chills, chest pain, pressure, palpitations, shortness of breath, or any other GI, GU or neuro issues.  Family history: Significant for heart disease in mother and father, heart attack in mother and father, stroke in father and thyroid and kidney cancer in his brother.   Social history: He does have a 3 year pack history, but stopped smoking in 1997.  He does not drink.  He is single and lives alone.     EXAMINATION: He is alert and oriented x 3 and in no acute distress.  Head is normocephalic, a traumatic.  PERRLA, EOMI.  Neck: Unremarkable.  Lungs: CTA bilaterally.  No wheezes, rales or rhonchi.  Heart: RRR.  No murmurs appreciated.  Abdomen: Soft and non-tender.  NBS x 4.  Calves: Non-tender bilaterally.  Neuro: Grossly intact to bilateral upper and lower extremities.  Skin: Warm and dry. Examination of his right knee reveals range of motion 0-120 degrees.  Moderate medial joint line tenderness.  Moderate patellofemoral crepitus.  Stable to valgus and varus stress.  He is  neurovascularly intact distally.    X-RAYS: Previous imaging of his right knee demonstrates severe end stage degenerative joint changes.    IMPRESSION: right knee end stage degenerative joint changes and chronic pain which has failed to respond to conservative treatment options.    PLAN: We will proceed with right total knee replacement as scheduled.  Discussed the risks, benefits and possible complications of surgery.  Rehab and recovery time discussed.  All questions answered.  Patient will need home health at the time of discharge.

## 2015-05-27 ENCOUNTER — Encounter (HOSPITAL_COMMUNITY): Payer: Self-pay

## 2015-05-27 ENCOUNTER — Encounter (HOSPITAL_COMMUNITY)
Admission: RE | Admit: 2015-05-27 | Discharge: 2015-05-27 | Disposition: A | Discharge status: Home / Self Care | Payer: BLUE CROSS/BLUE SHIELD | Source: Ambulatory Visit | Attending: Orthopedic Surgery | Admitting: Orthopedic Surgery

## 2015-05-27 DIAGNOSIS — Z01812 Encounter for preprocedural laboratory examination: Secondary | ICD-10-CM | POA: Insufficient documentation

## 2015-05-27 DIAGNOSIS — Z0183 Encounter for blood typing: Secondary | ICD-10-CM | POA: Insufficient documentation

## 2015-05-27 DIAGNOSIS — M179 Osteoarthritis of knee, unspecified: Secondary | ICD-10-CM | POA: Diagnosis not present

## 2015-05-27 LAB — CBC WITH DIFFERENTIAL/PLATELET
BASOS PCT: 0 %
Basophils Absolute: 0 10*3/uL (ref 0.0–0.1)
EOS ABS: 0.2 10*3/uL (ref 0.0–0.7)
EOS PCT: 3 %
HCT: 42.3 % (ref 39.0–52.0)
HEMOGLOBIN: 14.2 g/dL (ref 13.0–17.0)
Lymphocytes Relative: 38 %
Lymphs Abs: 2.2 10*3/uL (ref 0.7–4.0)
MCH: 29.8 pg (ref 26.0–34.0)
MCHC: 33.6 g/dL (ref 30.0–36.0)
MCV: 88.7 fL (ref 78.0–100.0)
MONOS PCT: 8 %
Monocytes Absolute: 0.4 10*3/uL (ref 0.1–1.0)
NEUTROS PCT: 51 %
Neutro Abs: 3 10*3/uL (ref 1.7–7.7)
PLATELETS: 199 10*3/uL (ref 150–400)
RBC: 4.77 MIL/uL (ref 4.22–5.81)
RDW: 13.9 % (ref 11.5–15.5)
WBC: 5.9 10*3/uL (ref 4.0–10.5)

## 2015-05-27 LAB — APTT: aPTT: 30 seconds (ref 24–37)

## 2015-05-27 LAB — COMPREHENSIVE METABOLIC PANEL
ALBUMIN: 3.6 g/dL (ref 3.5–5.0)
ALK PHOS: 84 U/L (ref 38–126)
ALT: 15 U/L — ABNORMAL LOW (ref 17–63)
ANION GAP: 6 (ref 5–15)
AST: 16 U/L (ref 15–41)
BUN: 20 mg/dL (ref 6–20)
CHLORIDE: 106 mmol/L (ref 101–111)
CO2: 28 mmol/L (ref 22–32)
Calcium: 9 mg/dL (ref 8.9–10.3)
Creatinine, Ser: 0.92 mg/dL (ref 0.61–1.24)
GFR calc non Af Amer: >60 mL/min (ref >60)
GLUCOSE: 94 mg/dL (ref 65–99)
POTASSIUM: 3.9 mmol/L (ref 3.5–5.1)
SODIUM: 140 mmol/L (ref 135–145)
Total Bilirubin: 0.7 mg/dL (ref 0.3–1.2)
Total Protein: 6.3 g/dL — ABNORMAL LOW (ref 6.5–8.1)

## 2015-05-27 LAB — TYPE AND SCREEN
ABO/RH(D): O POS
ANTIBODY SCREEN: NEGATIVE

## 2015-05-27 LAB — PROTIME-INR
INR: 1.13 (ref 0.00–1.49)
Prothrombin Time: 14.7 seconds (ref 11.6–15.2)

## 2015-05-27 LAB — SURGICAL PCR SCREEN
MRSA, PCR: NEGATIVE
STAPHYLOCOCCUS AUREUS: NEGATIVE

## 2015-05-27 NOTE — Progress Notes (Signed)
PCP is [name]  Patient denied having any acute cardiac or pulmonary issues  Patient informed Nurse that he had a prior knee surgery on 04/27/15 and requested that he be given a "medium long" TED hose instead of the "medium short" that he was given last time. Patient stated "it was just to short." Nurse explained to patient that he was given that size because of his measurements, and that the TED hose did not come in a "medium long", but a "medium regular". Patient verbalized understanding, and insisted on having the medium regular. Medium regular TED hose placed on chart for DOS.

## 2015-05-27 NOTE — Pre-Procedure Instructions (Signed)
JAHEED ADEM  05/27/2015     Your procedure is scheduled on : Wednesday June 08, 2015 at 1:00 PM.  Report to [facility] Admitting at 11:00 AM.  Call this number if you have problems the morning of surgery: [phone]    Remember:  Do not eat food or drink liquids after midnight.  Take these medicines the morning of surgery with A SIP OF WATER : Finasteride (Propecia)   Stop taking any vitamins, herbal medications, Ibuprofen, Advil, Motrin, Aleve, etc on Wednesday December 7th   Do not wear jewelry.  Do not wear lotions, powders, or cologne.   Men may shave face and neck.  Do not bring valuables to the hospital.  [facility] is not responsible for any belongings or valuables.  Contacts, dentures or bridgework may not be worn into surgery.  Leave your suitcase in the car.  After surgery it may be brought to your room.  For patients admitted to the hospital, discharge time will be determined by your treatment team.  Patients discharged the day of surgery will not be allowed to drive home.   Name and phone number of your driver:    Special instructions:  Shower using CHG soap the night before and the morning of your surgery  Please read over the following fact sheets that you were given. Pain Booklet, Coughing and Deep Breathing, Blood Transfusion Information, Total Joint Packet, MRSA Information and Surgical Site Infection Prevention

## 2015-05-28 LAB — URINE CULTURE: Culture: NO GROWTH

## 2015-06-07 MED ORDER — TRANEXAMIC ACID 1000 MG/10ML IV SOLN
1000.0000 mg | INTRAVENOUS | Status: AC
  Administered 2015-06-08: 1000 mg via INTRAVENOUS
  Filled 2015-06-07: qty 10

## 2015-06-08 ENCOUNTER — Inpatient Hospital Stay (HOSPITAL_COMMUNITY): Payer: BLUE CROSS/BLUE SHIELD | Admitting: Anesthesiology

## 2015-06-08 ENCOUNTER — Encounter (HOSPITAL_COMMUNITY)
Admission: RE | Disposition: A | Discharge status: Home Health | Payer: Self-pay | Source: Ambulatory Visit | Attending: Orthopedic Surgery

## 2015-06-08 ENCOUNTER — Inpatient Hospital Stay (HOSPITAL_COMMUNITY)
Admission: RE | Admit: 2015-06-08 | Discharge: 2015-06-10 | DRG: 470 | Disposition: A | Discharge status: Home Health | Payer: BLUE CROSS/BLUE SHIELD | Source: Ambulatory Visit | Attending: Orthopedic Surgery | Admitting: Orthopedic Surgery

## 2015-06-08 ENCOUNTER — Encounter (HOSPITAL_COMMUNITY): Payer: Self-pay | Admitting: Anesthesiology

## 2015-06-08 ENCOUNTER — Inpatient Hospital Stay (HOSPITAL_COMMUNITY): Payer: BLUE CROSS/BLUE SHIELD

## 2015-06-08 DIAGNOSIS — Z96652 Presence of left artificial knee joint: Secondary | ICD-10-CM | POA: Diagnosis present

## 2015-06-08 DIAGNOSIS — M1711 Unilateral primary osteoarthritis, right knee: Principal | ICD-10-CM | POA: Diagnosis present

## 2015-06-08 DIAGNOSIS — Z87891 Personal history of nicotine dependence: Secondary | ICD-10-CM

## 2015-06-08 DIAGNOSIS — M1710 Unilateral primary osteoarthritis, unspecified knee: Secondary | ICD-10-CM | POA: Diagnosis present

## 2015-06-08 DIAGNOSIS — I10 Essential (primary) hypertension: Secondary | ICD-10-CM | POA: Diagnosis present

## 2015-06-08 DIAGNOSIS — Z8546 Personal history of malignant neoplasm of prostate: Secondary | ICD-10-CM

## 2015-06-08 DIAGNOSIS — M25561 Pain in right knee: Secondary | ICD-10-CM | POA: Diagnosis present

## 2015-06-08 DIAGNOSIS — M179 Osteoarthritis of knee, unspecified: Secondary | ICD-10-CM | POA: Diagnosis present

## 2015-06-08 DIAGNOSIS — D62 Acute posthemorrhagic anemia: Secondary | ICD-10-CM | POA: Diagnosis not present

## 2015-06-08 DIAGNOSIS — Z96659 Presence of unspecified artificial knee joint: Secondary | ICD-10-CM

## 2015-06-08 HISTORY — DX: Calculus of kidney: N20.0

## 2015-06-08 HISTORY — DX: Malignant neoplasm of prostate: C61

## 2015-06-08 HISTORY — PX: TOTAL KNEE ARTHROPLASTY: SHX125

## 2015-06-08 HISTORY — DX: Hemochromatosis, unspecified: E83.119

## 2015-06-08 SURGERY — ARTHROPLASTY, KNEE, TOTAL
Anesthesia: General | Laterality: Right

## 2015-06-08 MED ORDER — METHOCARBAMOL 1000 MG/10ML IJ SOLN
500.0000 mg | Freq: Four times a day (QID) | INTRAVENOUS | Status: DC | PRN
  Filled 2015-06-08: qty 5

## 2015-06-08 MED ORDER — ACETAMINOPHEN 650 MG RE SUPP: 650.0000 mg | Freq: Four times a day (QID) | RECTAL | Status: DC | PRN

## 2015-06-08 MED ORDER — LACTATED RINGERS IV SOLN
INTRAVENOUS | Status: DC
  Administered 2015-06-08 (×2): via INTRAVENOUS

## 2015-06-08 MED ORDER — ONDANSETRON HCL 4 MG PO TABS: 4.0000 mg | ORAL_TABLET | Freq: Four times a day (QID) | ORAL | Status: DC | PRN

## 2015-06-08 MED ORDER — PHENYLEPHRINE HCL 10 MG/ML IJ SOLN
INTRAMUSCULAR | Status: DC | PRN
  Administered 2015-06-08 (×5): 80 ug via INTRAVENOUS

## 2015-06-08 MED ORDER — ONDANSETRON HCL 4 MG PO TABS: 4.0000 mg | ORAL_TABLET | Freq: Three times a day (TID) | ORAL | Status: DC | PRN

## 2015-06-08 MED ORDER — METOCLOPRAMIDE HCL 5 MG PO TABS: 5.0000 mg | ORAL_TABLET | Freq: Three times a day (TID) | ORAL | Status: DC | PRN

## 2015-06-08 MED ORDER — BUPIVACAINE HCL 0.5 % IJ SOLN: INTRAMUSCULAR | Status: DC | PRN

## 2015-06-08 MED ORDER — ZOLPIDEM TARTRATE 5 MG PO TABS
5.0000 mg | ORAL_TABLET | Freq: Every evening | ORAL | Status: DC | PRN
  Administered 2015-06-09: 5 mg via ORAL
  Filled 2015-06-08: qty 1

## 2015-06-08 MED ORDER — METHOCARBAMOL 500 MG PO TABS
ORAL_TABLET | ORAL | Status: AC
  Filled 2015-06-08: qty 1

## 2015-06-08 MED ORDER — OXYCODONE HCL 5 MG PO TABS
5.0000 mg | ORAL_TABLET | ORAL | Status: DC | PRN
  Administered 2015-06-08 – 2015-06-09 (×7): 10 mg via ORAL
  Filled 2015-06-08 (×6): qty 2

## 2015-06-08 MED ORDER — HYDROMORPHONE HCL 1 MG/ML IJ SOLN
INTRAMUSCULAR | Status: AC
  Filled 2015-06-08: qty 1

## 2015-06-08 MED ORDER — ACETAMINOPHEN 325 MG PO TABS
650.0000 mg | ORAL_TABLET | Freq: Four times a day (QID) | ORAL | Status: DC | PRN
  Administered 2015-06-08: 650 mg via ORAL
  Filled 2015-06-08: qty 2

## 2015-06-08 MED ORDER — METHOCARBAMOL 500 MG PO TABS: 500.0000 mg | ORAL_TABLET | Freq: Four times a day (QID) | ORAL | Status: DC

## 2015-06-08 MED ORDER — DEXAMETHASONE SODIUM PHOSPHATE 10 MG/ML IJ SOLN
INTRAMUSCULAR | Status: AC
  Filled 2015-06-08: qty 1

## 2015-06-08 MED ORDER — MIDAZOLAM HCL 2 MG/2ML IJ SOLN
INTRAMUSCULAR | Status: AC
  Filled 2015-06-08: qty 2

## 2015-06-08 MED ORDER — PROPOFOL 10 MG/ML IV BOLUS
INTRAVENOUS | Status: DC | PRN
  Administered 2015-06-08: 200 mg via INTRAVENOUS

## 2015-06-08 MED ORDER — PHENYLEPHRINE 40 MCG/ML (10ML) SYRINGE FOR IV PUSH (FOR BLOOD PRESSURE SUPPORT)
PREFILLED_SYRINGE | INTRAVENOUS | Status: AC
  Filled 2015-06-08: qty 10

## 2015-06-08 MED ORDER — BISACODYL 10 MG RE SUPP: 10.0000 mg | Freq: Every day | RECTAL | Status: DC | PRN

## 2015-06-08 MED ORDER — ONDANSETRON HCL 4 MG/2ML IJ SOLN: 4.0000 mg | Freq: Four times a day (QID) | INTRAMUSCULAR | Status: DC | PRN

## 2015-06-08 MED ORDER — HYDROMORPHONE HCL 1 MG/ML IJ SOLN
0.5000 mg | INTRAMUSCULAR | Status: DC | PRN
  Administered 2015-06-09 – 2015-06-10 (×2): 1 mg via INTRAVENOUS
  Filled 2015-06-08 (×2): qty 1

## 2015-06-08 MED ORDER — METHOCARBAMOL 500 MG PO TABS
500.0000 mg | ORAL_TABLET | Freq: Four times a day (QID) | ORAL | Status: DC | PRN
  Administered 2015-06-08 – 2015-06-10 (×5): 500 mg via ORAL
  Filled 2015-06-08 (×4): qty 1

## 2015-06-08 MED ORDER — APIXABAN 2.5 MG PO TABS
2.5000 mg | ORAL_TABLET | Freq: Two times a day (BID) | ORAL | Status: DC
  Administered 2015-06-09 – 2015-06-10 (×3): 2.5 mg via ORAL
  Filled 2015-06-08 (×3): qty 1

## 2015-06-08 MED ORDER — LIDOCAINE HCL (CARDIAC) 20 MG/ML IV SOLN
INTRAVENOUS | Status: AC
  Filled 2015-06-08: qty 5

## 2015-06-08 MED ORDER — ALUM & MAG HYDROXIDE-SIMETH 200-200-20 MG/5ML PO SUSP: 30.0000 mL | ORAL | Status: DC | PRN

## 2015-06-08 MED ORDER — SODIUM CHLORIDE 0.9 % IJ SOLN
INTRAMUSCULAR | Status: DC | PRN
  Administered 2015-06-08: 40 mL via INTRAVENOUS

## 2015-06-08 MED ORDER — OXYCODONE HCL 5 MG PO TABS
ORAL_TABLET | ORAL | Status: AC
  Filled 2015-06-08: qty 2

## 2015-06-08 MED ORDER — HYDROMORPHONE HCL 1 MG/ML IJ SOLN
0.2500 mg | INTRAMUSCULAR | Status: DC | PRN
  Administered 2015-06-08 (×3): 0.5 mg via INTRAVENOUS

## 2015-06-08 MED ORDER — ONDANSETRON HCL 4 MG/2ML IJ SOLN
INTRAMUSCULAR | Status: DC | PRN
  Administered 2015-06-08: 4 mg via INTRAVENOUS

## 2015-06-08 MED ORDER — CHLORHEXIDINE GLUCONATE 4 % EX LIQD: 60.0000 mL | Freq: Once | CUTANEOUS | Status: DC

## 2015-06-08 MED ORDER — PHENOL 1.4 % MT LIQD: 1.0000 | OROMUCOSAL | Status: DC | PRN

## 2015-06-08 MED ORDER — ONDANSETRON HCL 4 MG/2ML IJ SOLN
INTRAMUSCULAR | Status: AC
  Filled 2015-06-08: qty 2

## 2015-06-08 MED ORDER — MENTHOL 3 MG MT LOZG: 1.0000 | LOZENGE | OROMUCOSAL | Status: DC | PRN

## 2015-06-08 MED ORDER — POTASSIUM CHLORIDE IN NACL 20-0.9 MEQ/L-% IV SOLN
INTRAVENOUS | Status: DC
  Administered 2015-06-08: 100 mL/h via INTRAVENOUS
  Filled 2015-06-08: qty 1000

## 2015-06-08 MED ORDER — METOCLOPRAMIDE HCL 5 MG/ML IJ SOLN: 5.0000 mg | Freq: Three times a day (TID) | INTRAMUSCULAR | Status: DC | PRN

## 2015-06-08 MED ORDER — CEFAZOLIN SODIUM-DEXTROSE 2-3 GM-% IV SOLR
INTRAVENOUS | Status: AC
  Filled 2015-06-08: qty 50

## 2015-06-08 MED ORDER — SODIUM CHLORIDE 0.9 % IR SOLN
Status: DC | PRN
  Administered 2015-06-08: 3000 mL

## 2015-06-08 MED ORDER — MAGNESIUM CITRATE PO SOLN: 1.0000 | Freq: Once | ORAL | Status: DC | PRN

## 2015-06-08 MED ORDER — CEFAZOLIN SODIUM-DEXTROSE 2-3 GM-% IV SOLR
2.0000 g | Freq: Four times a day (QID) | INTRAVENOUS | Status: AC
  Administered 2015-06-08 (×2): 2 g via INTRAVENOUS
  Filled 2015-06-08 (×2): qty 50

## 2015-06-08 MED ORDER — CEFAZOLIN SODIUM-DEXTROSE 2-3 GM-% IV SOLR
2.0000 g | INTRAVENOUS | Status: AC
  Administered 2015-06-08: 2 g via INTRAVENOUS

## 2015-06-08 MED ORDER — FENTANYL CITRATE (PF) 100 MCG/2ML IJ SOLN
INTRAMUSCULAR | Status: DC | PRN
  Administered 2015-06-08: 50 ug via INTRAVENOUS
  Administered 2015-06-08: 25 ug via INTRAVENOUS
  Administered 2015-06-08: 100 ug via INTRAVENOUS
  Administered 2015-06-08 (×3): 25 ug via INTRAVENOUS

## 2015-06-08 MED ORDER — MIDAZOLAM HCL 5 MG/5ML IJ SOLN
INTRAMUSCULAR | Status: DC | PRN
  Administered 2015-06-08: 2 mg via INTRAVENOUS

## 2015-06-08 MED ORDER — OXYCODONE-ACETAMINOPHEN 5-325 MG PO TABS: 1.0000 | ORAL_TABLET | ORAL | Status: DC | PRN

## 2015-06-08 MED ORDER — DOCUSATE SODIUM 100 MG PO CAPS
100.0000 mg | ORAL_CAPSULE | Freq: Two times a day (BID) | ORAL | Status: DC
  Administered 2015-06-08 – 2015-06-09 (×3): 100 mg via ORAL
  Filled 2015-06-08 (×3): qty 1

## 2015-06-08 MED ORDER — FENTANYL CITRATE (PF) 250 MCG/5ML IJ SOLN
INTRAMUSCULAR | Status: AC
  Filled 2015-06-08: qty 5

## 2015-06-08 MED ORDER — FINASTERIDE 1 MG PO TABS: 1.0000 mg | ORAL_TABLET | Freq: Every day | ORAL | Status: DC

## 2015-06-08 MED ORDER — DIPHENHYDRAMINE HCL 12.5 MG/5ML PO ELIX: 12.5000 mg | ORAL_SOLUTION | ORAL | Status: DC | PRN

## 2015-06-08 MED ORDER — BUPIVACAINE LIPOSOME 1.3 % IJ SUSP
20.0000 mL | INTRAMUSCULAR | Status: AC
  Administered 2015-06-08: 20 mL
  Filled 2015-06-08: qty 20

## 2015-06-08 MED ORDER — LIDOCAINE HCL (CARDIAC) 20 MG/ML IV SOLN
INTRAVENOUS | Status: DC | PRN
  Administered 2015-06-08: 60 mg via INTRAVENOUS

## 2015-06-08 MED ORDER — BUPIVACAINE HCL (PF) 0.5 % IJ SOLN
INTRAMUSCULAR | Status: AC
  Filled 2015-06-08: qty 10

## 2015-06-08 MED ORDER — DEXAMETHASONE SODIUM PHOSPHATE 10 MG/ML IJ SOLN
INTRAMUSCULAR | Status: DC | PRN
  Administered 2015-06-08: 10 mg via INTRAVENOUS

## 2015-06-08 MED ORDER — BISACODYL 5 MG PO TBEC: 5.0000 mg | DELAYED_RELEASE_TABLET | Freq: Every day | ORAL | Status: DC | PRN

## 2015-06-08 MED ORDER — POLYETHYLENE GLYCOL 3350 17 G PO PACK: 17.0000 g | PACK | Freq: Every day | ORAL | Status: DC | PRN

## 2015-06-08 MED ORDER — APIXABAN 2.5 MG PO TABS: ORAL_TABLET | ORAL | Status: DC

## 2015-06-08 MED ORDER — MINOCYCLINE HCL 100 MG PO CAPS
100.0000 mg | ORAL_CAPSULE | Freq: Every evening | ORAL | Status: DC
  Administered 2015-06-08 – 2015-06-09 (×2): 100 mg via ORAL
  Filled 2015-06-08 (×3): qty 1

## 2015-06-08 MED ORDER — DEXAMETHASONE SODIUM PHOSPHATE 10 MG/ML IJ SOLN
10.0000 mg | Freq: Once | INTRAMUSCULAR | Status: AC
  Administered 2015-06-09: 10 mg via INTRAVENOUS
  Filled 2015-06-08: qty 1

## 2015-06-08 MED ORDER — LOSARTAN POTASSIUM 50 MG PO TABS
50.0000 mg | ORAL_TABLET | Freq: Every day | ORAL | Status: DC
  Administered 2015-06-08 – 2015-06-09 (×2): 50 mg via ORAL
  Filled 2015-06-08 (×2): qty 1

## 2015-06-08 MED ORDER — CELECOXIB 200 MG PO CAPS
200.0000 mg | ORAL_CAPSULE | Freq: Two times a day (BID) | ORAL | Status: DC
  Administered 2015-06-08 – 2015-06-09 (×3): 200 mg via ORAL
  Filled 2015-06-08 (×3): qty 1

## 2015-06-08 SURGICAL SUPPLY — 66 items
APL SKNCLS STERI-STRIP NONHPOA (GAUZE/BANDAGES/DRESSINGS) ×1
BANDAGE ELASTIC 4 VELCRO ST LF (GAUZE/BANDAGES/DRESSINGS) ×2 IMPLANT
BANDAGE ELASTIC 6 VELCRO ST LF (GAUZE/BANDAGES/DRESSINGS) ×2 IMPLANT
BANDAGE ESMARK 6X9 LF (GAUZE/BANDAGES/DRESSINGS) ×1 IMPLANT
BENZOIN TINCTURE PRP APPL 2/3 (GAUZE/BANDAGES/DRESSINGS) ×2 IMPLANT
BLADE SAG 18X100X1.27 (BLADE) ×4 IMPLANT
BNDG CMPR 9X6 STRL LF SNTH (GAUZE/BANDAGES/DRESSINGS) ×1
BNDG ESMARK 6X9 LF (GAUZE/BANDAGES/DRESSINGS) ×2
BOWL SMART MIX CTS (DISPOSABLE) ×2 IMPLANT
CAPT KNEE TOTAL 3 ×1 IMPLANT
CEMENT BONE SIMPLEX SPEEDSET (Cement) ×4 IMPLANT
COVER SURGICAL LIGHT HANDLE (MISCELLANEOUS) ×2 IMPLANT
CUFF TOURNIQUET SINGLE 34IN LL (TOURNIQUET CUFF) ×2 IMPLANT
DRAPE EXTREMITY T 121X128X90 (DRAPE) ×2 IMPLANT
DRAPE IMP U-DRAPE 54X76 (DRAPES) ×2 IMPLANT
DRAPE PROXIMA HALF (DRAPES) ×1 IMPLANT
DRAPE U-SHAPE 47X51 STRL (DRAPES) ×1 IMPLANT
DRSG PAD ABDOMINAL 8X10 ST (GAUZE/BANDAGES/DRESSINGS) ×2 IMPLANT
DURAPREP 26ML APPLICATOR (WOUND CARE) ×4 IMPLANT
ELECT CAUTERY BLADE 6.4 (BLADE) ×2 IMPLANT
ELECT REM PT RETURN 9FT ADLT (ELECTROSURGICAL) ×2
ELECTRODE REM PT RTRN 9FT ADLT (ELECTROSURGICAL) ×1 IMPLANT
EVACUATOR 1/8 PVC DRAIN (DRAIN) ×1 IMPLANT
FACESHIELD WRAPAROUND (MASK) ×4 IMPLANT
FACESHIELD WRAPAROUND OR TEAM (MASK) ×2 IMPLANT
GAUZE SPONGE 4X4 12PLY STRL (GAUZE/BANDAGES/DRESSINGS) ×2 IMPLANT
GLOVE BIOGEL PI IND STRL 7.0 (GLOVE) ×1 IMPLANT
GLOVE BIOGEL PI INDICATOR 7.0 (GLOVE) ×1
GLOVE ORTHO TXT STRL SZ7.5 (GLOVE) ×2 IMPLANT
GLOVE SURG ORTHO 7.0 STRL STRW (GLOVE) ×2 IMPLANT
GOWN STRL REUS W/ TWL LRG LVL3 (GOWN DISPOSABLE) ×2 IMPLANT
GOWN STRL REUS W/ TWL XL LVL3 (GOWN DISPOSABLE) ×1 IMPLANT
GOWN STRL REUS W/TWL LRG LVL3 (GOWN DISPOSABLE) ×4
GOWN STRL REUS W/TWL XL LVL3 (GOWN DISPOSABLE) ×2
HANDPIECE INTERPULSE COAX TIP (DISPOSABLE) ×2
IMMOBILIZER KNEE 22 UNIV (SOFTGOODS) ×1 IMPLANT
IMMOBILIZER KNEE 24 THIGH 36 (MISCELLANEOUS) IMPLANT
IMMOBILIZER KNEE 24 UNIV (MISCELLANEOUS) ×2
KIT BASIN OR (CUSTOM PROCEDURE TRAY) ×2 IMPLANT
KIT ROOM TURNOVER OR (KITS) ×2 IMPLANT
MANIFOLD NEPTUNE II (INSTRUMENTS) ×2 IMPLANT
NDL 18GX1X1/2 (RX/OR ONLY) (NEEDLE) ×1 IMPLANT
NDL HYPO 25GX1X1/2 BEV (NEEDLE) ×1 IMPLANT
NEEDLE 18GX1X1/2 (RX/OR ONLY) (NEEDLE) ×2 IMPLANT
NEEDLE HYPO 25GX1X1/2 BEV (NEEDLE) ×2 IMPLANT
NS IRRIG 1000ML POUR BTL (IV SOLUTION) ×2 IMPLANT
PACK TOTAL JOINT (CUSTOM PROCEDURE TRAY) ×2 IMPLANT
PACK UNIVERSAL I (CUSTOM PROCEDURE TRAY) ×2 IMPLANT
PAD ARMBOARD 7.5X6 YLW CONV (MISCELLANEOUS) ×4 IMPLANT
PAD CAST 4YDX4 CTTN HI CHSV (CAST SUPPLIES) ×1 IMPLANT
PADDING CAST COTTON 4X4 STRL (CAST SUPPLIES) ×2
SET HNDPC FAN SPRY TIP SCT (DISPOSABLE) ×1 IMPLANT
STRIP CLOSURE SKIN 1/2X4 (GAUZE/BANDAGES/DRESSINGS) ×4 IMPLANT
SUCTION FRAZIER TIP 10 FR DISP (SUCTIONS) ×2 IMPLANT
SUT MNCRL AB 4-0 PS2 18 (SUTURE) ×2 IMPLANT
SUT VIC AB 0 CT1 27 (SUTURE)
SUT VIC AB 0 CT1 27XBRD ANBCTR (SUTURE) IMPLANT
SUT VIC AB 1 CTX 36 (SUTURE) ×2
SUT VIC AB 1 CTX36XBRD ANBCTR (SUTURE) ×1 IMPLANT
SUT VIC AB 2-0 CT1 27 (SUTURE) ×4
SUT VIC AB 2-0 CT1 TAPERPNT 27 (SUTURE) ×2 IMPLANT
SYR 50ML LL SCALE MARK (SYRINGE) ×2 IMPLANT
SYR CONTROL 10ML LL (SYRINGE) ×2 IMPLANT
TOWEL OR 17X24 6PK STRL BLUE (TOWEL DISPOSABLE) ×2 IMPLANT
TOWEL OR 17X26 10 PK STRL BLUE (TOWEL DISPOSABLE) ×2 IMPLANT
WATER STERILE IRR 1000ML POUR (IV SOLUTION) ×1 IMPLANT

## 2015-06-08 NOTE — Progress Notes (Signed)
Orthopedic Tech Progress Note Patient Details:  Randall Bradley Oct 23, 1955 [id number]  CPM Right Knee CPM Right Knee: On Right Knee Flexion (Degrees): 0 Right Knee Extension (Degrees): 0 Additional Comments: [email or web address] bar opatient helper Viewed order from doctor's order list  Hildred Priest 06/08/2015, 1:19 PM

## 2015-06-08 NOTE — Discharge Instructions (Signed)
INSTRUCTIONS AFTER JOINT REPLACEMENT   o Remove items at home which could result in a fall. This includes throw rugs or furniture in walking pathways o ICE to the affected joint every three hours while awake for 30 minutes at a time, for at least the first 3-5 days, and then as needed for pain and swelling.  Continue to use ice for pain and swelling. You may notice swelling that will progress down to the foot and ankle.  This is normal after surgery.  Elevate your leg when you are not up walking on it.   o Continue to use the breathing machine you got in the hospital (incentive spirometer) which will help keep your temperature down.  It is common for your temperature to cycle up and down following surgery, especially at night when you are not up moving around and exerting yourself.  The breathing machine keeps your lungs expanded and your temperature down.  BLOOD THINNER: TAKE ELIQUIS AS PRESCRIBED FOR A TOTAL OF 14 DAYS FOLLOWING SURGERY TO PREVENT BLOOD CLOTS.  ONCE FINISHED WITH THIS, TAKE ASPIRIN 325 MG ONE TAB ONCE DAILY FOR THE NEXT 14 DAYS.  THIS IS ALSO USED TO PREVENT BLOOD CLOTS.  DIET:  As you were doing prior to hospitalization, we recommend a well-balanced diet.  DRESSING / WOUND CARE / SHOWERING  You may change your dressing 3-5 days after surgery.  Then change the dressing every day with sterile gauze.  Please use good hand washing techniques before changing the dressing.  Do not use any lotions or creams on the incision until instructed by your surgeon. and You may shower 3 days after surgery, but keep the wounds dry during showering.  You may use an occlusive plastic wrap (Press'n Seal for example), NO SOAKING/SUBMERGING IN THE BATHTUB.  If the bandage gets wet, change with a clean dry gauze.  If the incision gets wet, pat the wound dry with a clean towel.  ACTIVITY  o Increase activity slowly as tolerated, but follow the weight bearing instructions below.   o No driving for 6 weeks  or until further direction given by your physician.  You cannot drive while taking narcotics.  o No lifting or carrying greater than 10 lbs. until further directed by your surgeon. o Avoid periods of inactivity such as sitting longer than an hour when not asleep. This helps prevent blood clots.  o You may return to work once you are authorized by your doctor.     WEIGHT BEARING   Weight bearing as tolerated with assist device (walker, cane, etc) as directed, use it as long as suggested by your surgeon or therapist, typically at least 4-6 weeks.   EXERCISES  Results after joint replacement surgery are often greatly improved when you follow the exercise, range of motion and muscle strengthening exercises prescribed by your doctor. Safety measures are also important to protect the joint from further injury. Any time any of these exercises cause you to have increased pain or swelling, decrease what you are doing until you are comfortable again and then slowly increase them. If you have problems or questions, call your caregiver or physical therapist for advice.   Rehabilitation is important following a joint replacement. After just a few days of immobilization, the muscles of the leg can become weakened and shrink (atrophy).  These exercises are designed to build up the tone and strength of the thigh and leg muscles and to improve motion. Often times heat used for twenty to thirty  minutes before working out will loosen up your tissues and help with improving the range of motion but do not use heat for the first two weeks following surgery (sometimes heat can increase post-operative swelling).   These exercises can be done on a training (exercise) mat, on the floor, on a table or on a bed. Use whatever works the best and is most comfortable for you.    Use music or television while you are exercising so that the exercises are a pleasant break in your day. This will make your life better with the  exercises acting as a break in your routine that you can look forward to.   Perform all exercises about fifteen times, three times per day or as directed.  You should exercise both the operative leg and the other leg as well.  Exercises include:    Quad Sets - Tighten up the muscle on the front of the thigh (Quad) and hold for 5-10 seconds.    Straight Leg Raises - With your knee straight (if you were given a brace, keep it on), lift the leg to 60 degrees, hold for 3 seconds, and slowly lower the leg.  Perform this exercise against resistance later as your leg gets stronger.   Leg Slides: Lying on your back, slowly slide your foot toward your buttocks, bending your knee up off the floor (only go as far as is comfortable). Then slowly slide your foot back down until your leg is flat on the floor again.   Angel Wings: Lying on your back spread your legs to the side as far apart as you can without causing discomfort.   Hamstring Strength:  Lying on your back, push your heel against the floor with your leg straight by tightening up the muscles of your buttocks.  Repeat, but this time bend your knee to a comfortable angle, and push your heel against the floor.  You may put a pillow under the heel to make it more comfortable if necessary.   A rehabilitation program following joint replacement surgery can speed recovery and prevent re-injury in the future due to weakened muscles. Contact your doctor or a physical therapist for more information on knee rehabilitation.    CONSTIPATION  Constipation is defined medically as fewer than three stools per week and severe constipation as less than one stool per week.  Even if you have a regular bowel pattern at home, your normal regimen is likely to be disrupted due to multiple reasons following surgery.  Combination of anesthesia, postoperative narcotics, change in appetite and fluid intake all can affect your bowels.   YOU MUST use at least one of the  following options; they are listed in order of increasing strength to get the job done.  They are all available over the counter, and you may need to use some, POSSIBLY even all of these options:    Drink plenty of fluids (prune juice may be helpful) and high fiber foods Colace 100 mg by mouth twice a day  Senokot for constipation as directed and as needed Dulcolax (bisacodyl), take with full glass of water  Miralax (polyethylene glycol) once or twice a day as needed.  If you have tried all these things and are unable to have a bowel movement in the first 3-4 days after surgery call either your surgeon or your primary doctor.    If you experience loose stools or diarrhea, hold the medications until you stool forms back up.  If your symptoms  do not get better within 1 week or if they get worse, check with your doctor.  If you experience "the worst abdominal pain ever" or develop nausea or vomiting, please contact the office immediately for further recommendations for treatment.   ITCHING:  If you experience itching with your medications, try taking only a single pain pill, or even half a pain pill at a time.  You can also use Benadryl over the counter for itching or also to help with sleep.   TED HOSE STOCKINGS:  Use stockings on both legs until for at least 2 weeks or as directed by physician office. They may be removed at night for sleeping.  MEDICATIONS:  See your medication summary on the After Visit Summary that nursing will review with you.  You may have some home medications which will be placed on hold until you complete the course of blood thinner medication.  It is important for you to complete the blood thinner medication as prescribed.  PRECAUTIONS:  If you experience chest pain or shortness of breath - call 911 immediately for transfer to the hospital emergency department.   If you develop a fever greater that 101 F, purulent drainage from wound, increased redness or drainage from  wound, foul odor from the wound/dressing, or calf pain - CONTACT YOUR SURGEON.                                                   FOLLOW-UP APPOINTMENTS:  If you do not already have a post-op appointment, please call the office for an appointment to be seen by your surgeon.  Guidelines for how soon to be seen are listed in your After Visit Summary, but are typically between 1-4 weeks after surgery.  OTHER INSTRUCTIONS:   Knee Replacement:  Do not place pillow under knee, focus on keeping the knee straight while resting. CPM instructions: 0-90 degrees, 2 hours in the morning, 2 hours in the afternoon, and 2 hours in the evening. Place foam block, curve side up under heel at all times except when in CPM or when walking.  DO NOT modify, tear, cut, or change the foam block in any way.  MAKE SURE YOU:   Understand these instructions.   Get help right away if you are not doing well or get worse.    Thank you for letting us be a part of your medical care team.  It is a privilege we respect greatly.  We hope these instructions will help you stay on track for a fast and full recovery!

## 2015-06-08 NOTE — Transfer of Care (Signed)
Immediate Anesthesia Transfer of Care Note  Patient: Randall Bradley  Procedure(s) Performed: Procedure(s): TOTAL RIGHT KNEE ARTHROPLASTY (Right)  Patient Location: PACU  Anesthesia Type:General  Level of Consciousness: awake, alert  and oriented  Airway & Oxygen Therapy: Patient Spontanous Breathing and Patient connected to nasal cannula oxygen  Post-op Assessment: Report given to RN and Post -op Vital signs reviewed and stable  Post vital signs: Reviewed and stable  Last Vitals:  Filed Vitals:   06/08/15 0939  BP: 139/98  Pulse: 78  Temp: 36.3 C  Resp: 20    Complications: No apparent anesthesia complications

## 2015-06-08 NOTE — Anesthesia Postprocedure Evaluation (Signed)
Anesthesia Post Note  Patient: Randall Bradley  Procedure(s) Performed: Procedure(s) (LRB): TOTAL RIGHT KNEE ARTHROPLASTY (Right)  Patient location during evaluation: PACU Anesthesia Type: General Level of consciousness: awake and alert Pain management: pain level controlled Vital Signs Assessment: post-procedure vital signs reviewed and stable Respiratory status: spontaneous breathing, nonlabored ventilation, respiratory function stable and patient connected to nasal cannula oxygen Cardiovascular status: blood pressure returned to baseline and stable Postop Assessment: no signs of nausea or vomiting Anesthetic complications: no    Last Vitals:  Filed Vitals:   06/08/15 1345 06/08/15 1400  BP: 134/96 131/87  Pulse: 89 92  Temp:    Resp: 12 11    Last Pain:  Filed Vitals:   06/08/15 1407  PainSc: Asleep                 Tynesha Free,W. EDMOND

## 2015-06-08 NOTE — H&P (View-Only) (Signed)
Chief complaint: HPI.  This is a 59 year-old male with a history of right knee end stage degenerative joint disease and chronic pain.  Returns for follow up.  Symptoms are unchanged from his previous visits and he wishes to proceed with right total knee replacement as scheduled.  He has significant pain with activities affecting his quality of life and ability to perform ADLs.  He has failed to respond to conservative treatment options.   Current medications: Minocycline, Losartan, Finasteride, Cyproheptadine, Aleve and Omega-3 fish oil.  Allergies: No known drug allergies. Past medical history: Significant for glasses, hypertension, Hepatitis B, prostate cancer which is not active and kidney stones. Past surgical history: Significant for left knee arthroscopy and left total knee replacement, radical prostatectomy and kidney stone ablation.   Review of systems: Patient currently denies lightheadedness, dizziness, fevers, chills, chest pain, pressure, palpitations, shortness of breath, or any other GI, GU or neuro issues.  Family history: Significant for heart disease in mother and father, heart attack in mother and father, stroke in father and thyroid and kidney cancer in his brother.   Social history: He does have a 3 year pack history, but stopped smoking in 1997.  He does not drink.  He is single and lives alone.     EXAMINATION: He is alert and oriented x 3 and in no acute distress.  Head is normocephalic, a traumatic.  PERRLA, EOMI.  Neck: Unremarkable.  Lungs: CTA bilaterally.  No wheezes, rales or rhonchi.  Heart: RRR.  No murmurs appreciated.  Abdomen: Soft and non-tender.  NBS x 4.  Calves: Non-tender bilaterally.  Neuro: Grossly intact to bilateral upper and lower extremities.  Skin: Warm and dry. Examination of his right knee reveals range of motion 0-120 degrees.  Moderate medial joint line tenderness.  Moderate patellofemoral crepitus.  Stable to valgus and varus stress.  He is  neurovascularly intact distally.    X-RAYS: Previous imaging of his right knee demonstrates severe end stage degenerative joint changes.    IMPRESSION: right knee end stage degenerative joint changes and chronic pain which has failed to respond to conservative treatment options.    PLAN: We will proceed with right total knee replacement as scheduled.  Discussed the risks, benefits and possible complications of surgery.  Rehab and recovery time discussed.  All questions answered.  Patient will need home health at the time of discharge.

## 2015-06-08 NOTE — Discharge Summary (Addendum)
Patient ID: Randall Bradley MRN: [id number] DOB/AGE: [birth year] 62 y.o.  Admit date: 06/08/2015 Discharge date: 06/10/2015  Admission Diagnoses:  Active Problems:   DJD (degenerative joint disease) of knee   Discharge Diagnoses:  Same  Past Medical History  Diagnosis Date  . History of bladder stone   . Right ureteral stone   . Hereditary hemochromatosis ([place]) monitored by PCP  dr [name] gates    history of phlebotomies -- last one 2014 (pt states body has normalized)  . Hematuria   . Rosacea   . Alopecia   . Hypertension   . Kidney stones   . Prostate cancer ([place])     UROLOGIST-- DR Diona Fanti; S/P PROSTATECTOMY 03-17-2008  . Hemochromatosis   . Arthritis     "knees"    Surgeries: Procedure(s): TOTAL RIGHT KNEE ARTHROPLASTY on 06/08/2015   Consultants:    Discharged Condition: Improved  Hospital Course: Randall Bradley is an 59 y.o. male who was admitted 06/08/2015 for operative treatment of primary localized osteoarthritis right knee. Patient has severe unremitting pain that affects sleep, daily activities, and work/hobbies. After pre-op clearance the patient was taken to the operating room on 06/08/2015 and underwent  Procedure(s): TOTAL RIGHT KNEE ARTHROPLASTY.  Patient with a pre-op Hb of 14.6 developed ABLA on pod #1 with a Hb of 12.6.  Patient is stable but we will continue to follow.  Patient was given perioperative antibiotics:      Anti-infectives    Start     Dose/Rate Route Frequency Ordered Stop   06/08/15 1800  minocycline (MINOCIN,DYNACIN) capsule 100 mg     100 mg Oral Every evening 06/08/15 1649     06/08/15 1700  ceFAZolin (ANCEF) IVPB 2 g/50 mL premix     2 g 100 mL/hr over 30 Minutes Intravenous Every 6 hours 06/08/15 1649 06/08/15 2239   06/08/15 0942  ceFAZolin (ANCEF) 2-3 GM-% IVPB SOLR    Comments:  Sammuel Cooper   : cabinet override      06/08/15 0942 06/08/15 2144   06/08/15 0937  ceFAZolin (ANCEF) IVPB 2 g/50 mL premix     2 g 100  mL/hr over 30 Minutes Intravenous On call to O.R. 06/08/15 0937 06/08/15 1120       Patient was given sequential compression devices, early ambulation, and chemoprophylaxis to prevent DVT.  Patient benefited maximally from hospital stay and there were no complications.    Recent vital signs:  Patient Vitals for the past 24 hrs:  BP Temp Temp src Pulse Resp SpO2  06/10/15 0506 125/82 mmHg 97.9 F (36.6 C) Oral 73 15 99 %  06/09/15 2319 127/78 mmHg 97.6 F (36.4 C) Oral 89 16 97 %  06/09/15 1300 111/63 mmHg 98.1 F (36.7 C) Oral 91 16 100 %     Recent laboratory studies:   Recent Labs  06/09/15 0640 06/10/15 0506  WBC 12.9* 12.2*  HGB 12.6* 12.2*  HCT 38.0* 36.6*  PLT 176 177  NA 139 139  K 4.8 3.8  CL 105 105  CO2 27 27  BUN 12 15  CREATININE 0.97 0.93  GLUCOSE 123* 111*  CALCIUM 8.8* 8.5*     Discharge Medications:     Medication List    TAKE these medications        apixaban 2.5 MG Tabs tablet  Commonly known as:  ELIQUIS  Take 1 tab po q 12 hours x 14 days following surgery to prevent blood clots     bisacodyl 5  MG EC tablet  Commonly known as:  DULCOLAX  Take 1 tablet (5 mg total) by mouth daily as needed for moderate constipation.     celecoxib 200 MG capsule  Commonly known as:  CELEBREX  Take 1 capsule (200 mg total) by mouth 2 (two) times daily.     chlorpheniramine 4 MG tablet  Commonly known as:  CHLOR-TRIMETON  Take 4 mg by mouth 2 (two) times daily as needed for allergies.     finasteride 1 MG tablet  Commonly known as:  PROPECIA  Take 1 mg by mouth daily.     losartan 50 MG tablet  Commonly known as:  COZAAR  Take 50 mg by mouth daily.     methocarbamol 500 MG tablet  Commonly known as:  ROBAXIN  Take 1 tablet (500 mg total) by mouth 4 (four) times daily.     minocycline 100 MG capsule  Commonly known as:  MINOCIN,DYNACIN  Take 100 mg by mouth every evening.     multivitamin with minerals Tabs tablet  Take 1 tablet by mouth  daily.     ondansetron 4 MG tablet  Commonly known as:  ZOFRAN  Take 1 tablet (4 mg total) by mouth every 8 (eight) hours as needed for nausea or vomiting.     oxyCODONE-acetaminophen 5-325 MG tablet  Commonly known as:  ROXICET  Take 1-2 tablets by mouth every 4 (four) hours as needed.        Diagnostic Studies: Dg Knee Right Port  06/08/2015  CLINICAL DATA:  Status post total knee replacement EXAM: PORTABLE RIGHT KNEE - 1-2 VIEW COMPARISON:  Right knee MRI August 20, 2011 FINDINGS: Frontal and lateral views were obtained. Patient is status post total knee replacement with femoral and tibial prosthetic components appearing well-seated. No acute fracture or dislocation. Air in fluid within the joint is an expected postoperative finding. IMPRESSION: Prosthetic components appear well seated. No acute fracture or dislocation. Electronically Signed   By: Lowella Grip III M.D.   On: 06/08/2015 17:49    Disposition: 06-Home-Health Care Svc    Follow-up Information    Follow up with [name], MD. Schedule an appointment as soon as possible for a visit in 2 weeks.   Specialty:  Orthopedic Surgery   Contact information:   [place] [id number] [phone]       Follow up with [facility].   Why:  They will contact you to schedule home therapy visits.   Contact information:   [place] [address] [phone]        Signed: Fannie Knee 06/10/2015, 7:14 AM

## 2015-06-08 NOTE — Progress Notes (Signed)
Orthopedic Tech Progress Note Patient Details:  Randall Bradley 09/28/1955 [id number] On cpm at [facility] Patient ID: Randall Bradley, male   DOB: August 31, 1955, 59 y.o.   MRN: [id number]   Braulio Bosch 06/08/2015, 6:44 PM

## 2015-06-08 NOTE — Anesthesia Procedure Notes (Signed)
Procedure Name: LMA Insertion Date/Time: 06/08/2015 10:50 AM Performed by: Rush Farmer E Pre-anesthesia Checklist: Patient identified, Emergency Drugs available, Suction available, Patient being monitored and Timeout performed Patient Re-evaluated:Patient Re-evaluated prior to inductionOxygen Delivery Method: Circle system utilized Preoxygenation: Pre-oxygenation with 100% oxygen Intubation Type: IV induction LMA: LMA inserted LMA Size: 4.0 Number of attempts: 1 Placement Confirmation: positive ETCO2 and breath sounds checked- equal and bilateral Tube secured with: Tape Dental Injury: Teeth and Oropharynx as per pre-operative assessment

## 2015-06-08 NOTE — Anesthesia Preprocedure Evaluation (Addendum)
Anesthesia Evaluation  Patient identified by MRN, date of birth, ID band Patient awake    Reviewed: Allergy & Precautions, H&P , NPO status , Patient's Chart, lab work & pertinent test results  Airway Mallampati: II  TM Distance: >3 FB Neck ROM: Full    Dental no notable dental hx. (+) Teeth Intact, Dental Advisory Given   Pulmonary neg pulmonary ROS, former smoker,    Pulmonary exam normal breath sounds clear to auscultation       Cardiovascular hypertension, Pt. on medications  Rhythm:Regular Rate:Normal     Neuro/Psych negative neurological ROS  negative psych ROS   GI/Hepatic negative GI ROS, Neg liver ROS,   Endo/Other  negative endocrine ROS  Renal/GU negative Renal ROS  negative genitourinary   Musculoskeletal  (+) Arthritis , Osteoarthritis,    Abdominal   Peds  Hematology negative hematology ROS (+)   Anesthesia Other Findings   Reproductive/Obstetrics negative OB ROS                          Anesthesia Physical Anesthesia Plan  ASA: II  Anesthesia Plan: General   Post-op Pain Management:    Induction: Intravenous  Airway Management Planned: LMA  Additional Equipment:   Intra-op Plan:   Post-operative Plan: Extubation in OR  Informed Consent: I have reviewed the patients History and Physical, chart, labs and discussed the procedure including the risks, benefits and alternatives for the proposed anesthesia with the patient or authorized representative who has indicated his/her understanding and acceptance.   Dental advisory given  Plan Discussed with: CRNA  Anesthesia Plan Comments: (Pt declines spinal and declines PNB.)      Anesthesia Quick Evaluation

## 2015-06-08 NOTE — Progress Notes (Signed)
Report given to laure rn as caregiver

## 2015-06-08 NOTE — Interval H&P Note (Signed)
History and Physical Interval Note:  06/08/2015 7:50 AM  Randall Bradley  has presented today for surgery, with the diagnosis of DJD RIGHT KNEE  The various methods of treatment have been discussed with the patient and family. After consideration of risks, benefits and other options for treatment, the patient has consented to  Procedure(s): TOTAL RIGHT KNEE ARTHROPLASTY (Right) as a surgical intervention .  The patient's history has been reviewed, patient examined, no change in status, stable for surgery.  I have reviewed the patient's chart and labs.  Questions were answered to the patient's satisfaction.     [name]

## 2015-06-09 ENCOUNTER — Encounter (HOSPITAL_COMMUNITY): Payer: Self-pay | Admitting: Orthopedic Surgery

## 2015-06-09 LAB — BASIC METABOLIC PANEL
Anion gap: 7 (ref 5–15)
BUN: 12 mg/dL (ref 6–20)
CO2: 27 mmol/L (ref 22–32)
CREATININE: 0.97 mg/dL (ref 0.61–1.24)
Calcium: 8.8 mg/dL — ABNORMAL LOW (ref 8.9–10.3)
Chloride: 105 mmol/L (ref 101–111)
GFR calc Af Amer: >60 mL/min (ref >60)
GLUCOSE: 123 mg/dL — AB (ref 65–99)
Potassium: 4.8 mmol/L (ref 3.5–5.1)
SODIUM: 139 mmol/L (ref 135–145)

## 2015-06-09 LAB — CBC
HCT: 38 % — ABNORMAL LOW (ref 39.0–52.0)
Hemoglobin: 12.6 g/dL — ABNORMAL LOW (ref 13.0–17.0)
MCH: 29.6 pg (ref 26.0–34.0)
MCHC: 33.2 g/dL (ref 30.0–36.0)
MCV: 89.4 fL (ref 78.0–100.0)
PLATELETS: 176 10*3/uL (ref 150–400)
RBC: 4.25 MIL/uL (ref 4.22–5.81)
RDW: 13.8 % (ref 11.5–15.5)
WBC: 12.9 10*3/uL — ABNORMAL HIGH (ref 4.0–10.5)

## 2015-06-09 MED ORDER — CELECOXIB 200 MG PO CAPS: 200.0000 mg | ORAL_CAPSULE | Freq: Two times a day (BID) | ORAL | Status: DC

## 2015-06-09 NOTE — Progress Notes (Signed)
Orthopedic Tech Progress Note Patient Details:  Randall Bradley 03-May-1956 [id number] On cpm at 1910 Patient ID: Randall Bradley, male   DOB: 10-06-1955, 59 y.o.   MRN: [id number]   Randall Bradley 06/09/2015, 7:07 PM

## 2015-06-09 NOTE — Progress Notes (Signed)
Physical Therapy Treatment Patient Details Name: Randall Bradley MRN: [id number] DOB: 04/26/1956 Today's Date: 06/09/2015    History of Present Illness 59 y.o. male s/p Rt TKA. PMH: hypertension, cancer, Lt TKA    PT Comments    Pt continues to progress very well but does have deliberate and slower gait pattern. He appeared to use the RW very little during ambulation and did not demonstrate anxiety or instability when walking without RW in room. Will attempt to ambulate with cane next session. Will continue to follow to improve independence with ambulation and R LE strength/ROM.   Follow Up Recommendations  Outpatient PT     Equipment Recommendations  None recommended by PT    Recommendations for Other Services       Precautions / Restrictions Precautions Precautions: Knee Restrictions Weight Bearing Restrictions: Yes RLE Weight Bearing: Weight bearing as tolerated    Mobility  Bed Mobility Overal bed mobility: Independent                Transfers Overall transfer level: Independent                  Ambulation/Gait Ambulation/Gait assistance: Modified independent (Device/Increase time);Supervision Ambulation Distance (Feet): 550 Feet Assistive device: Rolling walker (2 wheeled);None Gait Pattern/deviations: Step-through pattern;Decreased stride length   Gait velocity interpretation: Below normal speed for age/gender General Gait Details: Demonstrated good heel strike and toe off with R LE. Cues to maintain upright when walking. Pt walked 10 feet while supervised in room without RW with no instability.    Stairs            Wheelchair Mobility    Modified Rankin (Stroke Patients Only)       Balance Overall balance assessment: Modified Independent   Sitting balance-Leahy Scale: Good       Standing balance-Leahy Scale: Normal                      Cognition Arousal/Alertness: Awake/alert Behavior During Therapy: WFL for tasks  assessed/performed Overall Cognitive Status: Within Functional Limits for tasks assessed                      Exercises Total Joint Exercises Towel Squeeze: AROM;Both;10 reps;Seated Heel Slides: AROM;Right;10 reps;Supine Hip ABduction/ADduction: AROM;Both;10 reps;Seated Straight Leg Raises: AROM;Right;Seated;10 reps Long Arc Quad: AROM;Seated;10 reps;Right Marching in Standing: AROM;Seated;Both;10 reps    General Comments        Pertinent Vitals/Pain Pain Assessment: 0-10 Pain Score: 3  Pain Location: R Medial patella, lateral joint capsule of R knee Pain Descriptors / Indicators: Sore;Sharp;Aching Pain Intervention(s): Monitored during session;Repositioned    Home Living                      Prior Function            PT Goals (current goals can now be found in the care plan section) Acute Rehab PT Goals 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

## 2015-06-09 NOTE — Op Note (Signed)
NAME:  Randall Bradley, Randall Bradley NO.:  [id number]  MEDICAL RECORD NO.:  [id number]  LOCATION:  [id number]                        FACILITY:  [facility]  PHYSICIAN:  [name], M.D. DATE OF BIRTH:  Apr 05, 1956  DATE OF PROCEDURE:  06/08/2015 DATE OF DISCHARGE:                              OPERATIVE REPORT   PREOPERATIVE DIAGNOSES:  Right knee end-stage arthritis, primary generalized.  Underlying hemochromatosis.  POSTOPERATIVE DIAGNOSES:  Right knee end-stage arthritis, primary generalized.  Underlying hemochromatosis.  PROCEDURE:  Modified, minimally invasive right total knee replacement with Stryker triathlon prosthesis.  Cemented pegged posterior stabilized #5 patellar component.  Cemented #6 tibial component with a 9-mm PS insert.  Cemented resurfacing 38-mm patellar component.  SURGEON:  [name], M.D.  ASSISTANT:  Elmyra Ricks, PA, present throughout the entire case and necessary for timely completion of procedure.  ANESTHESIA:  General.  BLOOD LOSS:  Minimal.  SPECIMENS:  None.  CULTURES:  None.  COMPLICATIONS:  None.  DRESSINGS:  Soft compressive knee immobilizer.  TOURNIQUET TIME:  50 minutes.  DESCRIPTION OF PROCEDURE:  The patient was brought to the operating room, placed on the operating table in supine position.  After adequate anesthesia had been obtained, tourniquet applied, prepped and draped in usual sterile fashion.  Exsanguinated with elevation of Esmarch. Tourniquet inflated to 350 mmHg.  Straight incision above the patella down to tibial tubercle.  Medial arthrotomy, vastus splitting.  Medial capsule released.  Distal femur exposed.  An 8-mm resection, 5 degrees of valgus, flexible intramedullary guide.  Using epicondylar axis, the femur was sized, cut and fitted for pegged posterior stabilized #5 component.  Proximal tibial resection with extramedullary guide.  A 3- degree posterior slope cut.  Sized to #6 component.  Patella  exposed. Posterior 10-mm was removed.  Drilled, sized, and fitted for a 38-mm component.  Trials were put in place with the 9-mm PS insert.  Pleased with balancing by mechanical axis, stability, patellar tracking.  Tibia was marked for rotation and hand reamed.  All trials were removed. Copious irrigation with pulse irrigating device.  Cement prepared. Placed on all components, firmly seated.  Polyethylene attached to tibia and knee reduced.  Patella held with a clamp.  Once the cement hardened, the knee was irrigated again.  Soft tissue was injected with Exparel. Arthrotomy closed with Vicryl.  Subcutaneous and subcuticular closure. Margins were injected with Marcaine.  Sterile compressive dressing applied.  Tourniquet was deflated and removed.  Knee immobilizer applied.  Anesthesia reversed.  Brought to the recovery room.  Tolerated the surgery well.  No complications.     [name], M.D.     DFM/MEDQ  D:  06/08/2015  T:  06/09/2015  Job:  [id number]

## 2015-06-09 NOTE — Progress Notes (Signed)
OT  Note  Patient Details Name: Randall Bradley MRN: [id number] DOB: February 20, 1956      Reason Eval/Treat Not Completed: OT screened, no needs identified, will sign off. Pt with no acute care needs at this time. He has all equipment needed for discharge in order to increase I with functional tasks. He has had recent therapy in which he has been educated on AE. Please send text page to OT services if any questions, concerns, or with new orders: [phone] OR call office at [phone]. Thank you for the order.    Phineas Semen, MS, OTR/L 06/09/2015, 9:08 AM

## 2015-06-09 NOTE — Progress Notes (Signed)
Utilization review completed.  

## 2015-06-09 NOTE — Care Management Note (Addendum)
Case Management Note  Patient Details  Name: Randall Bradley MRN: [id number] Date of Birth: 1955-11-22  Subjective/Objective:          S/p right total knee arthroplasty          Action/Plan: Spoke with patient about discharge plan, he will be staying at [address] [place], [place] with a friend. He chose [facility] which he worked with in the past. Patient stated that he has a rolling walker and 3N1 and thinks [facility] will be providing his CPM. I confirmed CPM with [employer]. Patient states that his friend will be able to assist him after discharge. Contacted Susan at [facility] and set up [place].    Expected Discharge Date:                  Expected Discharge Plan:  [place]  In-House Referral:  NA  Discharge planning Services  CM Consult  Post Acute Care Choice:  Durable Medical Equipment, Home Health Choice offered to:  Patient  DME Arranged:  CPM DME Agency:  [employer]  HH Arranged:  PT [place] Agency:  [facility]  Status of Service:  Completed, signed off  Medicare Important Message Given:    Date Medicare IM Given:    Medicare IM give by:    Date Additional Medicare IM Given:    Additional Medicare Important Message give by:     If discussed at [place] of Stay Meetings, dates discussed:    Additional Comments:  Nila Nephew, RN 06/09/2015, 9:54 AM

## 2015-06-09 NOTE — Progress Notes (Signed)
Subjective: 1 Day Post-Op Procedure(s) (LRB): TOTAL RIGHT [name] (Right) Patient reports pain as mild.  Patient reports nausea yesterday but this has since resolved.  No lightheadedness/dizziness, chest pain/sob.    Objective: Vital signs in last 24 hours: Temp:  [97.3 F (36.3 C)-98.3 F (36.8 C)] 98.3 F (36.8 C) (12/15 0352) Pulse Rate:  [78-103] 93 (12/15 0352) Resp:  [8-20] 15 (12/15 0352) BP: (111-139)/(75-101) 111/78 mmHg (12/15 0352) SpO2:  [97 %-100 %] 100 % (12/15 0352) Weight:  [91.627 kg (202 lb)] 91.627 kg (202 lb) (12/14 0939)  Intake/Output from previous day: 12/14 0701 - 12/15 0700 In: 1120 [P.O.:120; I.V.:1000] Out: 1350 [Urine:1300; Blood:50] Intake/Output this shift: Total I/O In: -  Out: 700 [Urine:700]  No results for input(s): HGB in the last 72 hours. No results for input(s): WBC, RBC, HCT, PLT in the last 72 hours. No results for input(s): NA, K, CL, CO2, BUN, CREATININE, GLUCOSE, CALCIUM in the last 72 hours. No results for input(s): LABPT, INR in the last 72 hours.  Neurologically intact Neurovascular intact Sensation intact distally Intact pulses distally Dorsiflexion/Plantar flexion intact Compartment soft  Assessment/Plan: 1 Day Post-Op Procedure(s) (LRB): TOTAL RIGHT [name] (Right) Advance diet Up with therapy D/C IV fluids Plan for discharge tomorrow with hhpt WBAT RLE Dry dressing change prn  Randall Bradley 06/09/2015, 6:40 AM

## 2015-06-09 NOTE — Evaluation (Signed)
Physical Therapy Evaluation Patient Details Name: Randall Bradley MRN: [id number] DOB: 1956-02-20 Today's Date: 06/09/2015   History of Present Illness  59 y.o. male s/p Rt TKA. PMH: hypertension, cancer, Lt TKA  Clinical Impression  Pt moving exceptionally well for initial mobility with ability to already achieve 0-90 ROM. Educated for CPM use, bone foam, HEp, progression and plan. Pt verbalized understanding and should continue to progress quickly. Pt with decreased activity tolerance and weight bearing on RLE who will benefit from acute therapy to maximize activity and independence for return home.     Follow Up Recommendations Outpatient PT    Equipment Recommendations  None recommended by PT    Recommendations for Other Services       Precautions / Restrictions Precautions Precautions: Knee Restrictions RLE Weight Bearing: Weight bearing as tolerated      Mobility  Bed Mobility Overal bed mobility: Modified Independent                Transfers Overall transfer level: Modified independent                  Ambulation/Gait Ambulation/Gait assistance: Modified independent (Device/Increase time) Ambulation Distance (Feet): 400 Feet Assistive device: Rolling walker (2 wheeled) Gait Pattern/deviations: Step-through pattern;Decreased stride length   Gait velocity interpretation: Below normal speed for age/gender General Gait Details: cues x 3 for increased heel strike only. Pt with good posture, RW use and swing through gait  Stairs Stairs: Yes Stairs assistance: Modified independent (Device/Increase time) Stair Management: Two rails;Step to pattern;Forwards Number of Stairs: 2 General stair comments: pt performed without need for cues for sequence  Wheelchair Mobility    Modified Rankin (Stroke Patients Only)       Balance                                             Pertinent Vitals/Pain Pain Assessment: 0-10 Pain Score: 7   Pain Location: right knee Pain Descriptors / Indicators: Aching Pain Intervention(s): Limited activity within patient's tolerance;Monitored during session;Premedicated before session;Repositioned;Ice applied    Home Living Family/patient expects to be discharged to:: Private residence Living Arrangements: Alone Available Help at Discharge: Friend(s) Type of Home: House Home Access: Stairs to enter   [employer] of Steps: 1 Home Layout: One level Home Equipment: Environmental consultant - 2 wheels;Bedside commode;Shower seat      Prior Function Level of Independence: Independent               Hand Dominance        Extremity/Trunk Assessment                         Communication   Communication: No difficulties  Cognition Arousal/Alertness: Awake/alert Behavior During Therapy: WFL for tasks assessed/performed Overall Cognitive Status: Within Functional Limits for tasks assessed                      General Comments      Exercises Total Joint Exercises Quad Sets: AROM;Right;10 reps;Supine Heel Slides: AROM;Right;10 reps;Supine Straight Leg Raises: AROM;Right;Seated;10 reps Goniometric ROM: 0-92      Assessment/Plan    PT Assessment Patient needs continued PT services  PT Diagnosis Difficulty walking;Acute pain   PT Problem List Decreased strength;Decreased activity tolerance;Pain;Decreased knowledge of use of DME  PT Treatment Interventions DME instruction;Gait training;Functional mobility training;Therapeutic  activities;Therapeutic exercise;Patient/family education   PT Goals (Current goals can be found in the Care Plan section) Acute Rehab 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

## 2015-06-10 LAB — CBC
HEMATOCRIT: 36.6 % — AB (ref 39.0–52.0)
Hemoglobin: 12.2 g/dL — ABNORMAL LOW (ref 13.0–17.0)
MCH: 29.9 pg (ref 26.0–34.0)
MCHC: 33.3 g/dL (ref 30.0–36.0)
MCV: 89.7 fL (ref 78.0–100.0)
Platelets: 177 10*3/uL (ref 150–400)
RBC: 4.08 MIL/uL — ABNORMAL LOW (ref 4.22–5.81)
RDW: 13.8 % (ref 11.5–15.5)
WBC: 12.2 10*3/uL — ABNORMAL HIGH (ref 4.0–10.5)

## 2015-06-10 LAB — BASIC METABOLIC PANEL
Anion gap: 7 (ref 5–15)
BUN: 15 mg/dL (ref 6–20)
CALCIUM: 8.5 mg/dL — AB (ref 8.9–10.3)
CO2: 27 mmol/L (ref 22–32)
CREATININE: 0.93 mg/dL (ref 0.61–1.24)
Chloride: 105 mmol/L (ref 101–111)
GFR calc Af Amer: >60 mL/min (ref >60)
GFR calc non Af Amer: >60 mL/min (ref >60)
GLUCOSE: 111 mg/dL — AB (ref 65–99)
Potassium: 3.8 mmol/L (ref 3.5–5.1)
Sodium: 139 mmol/L (ref 135–145)

## 2015-06-10 NOTE — Progress Notes (Signed)
Physical Therapy Treatment Patient Details Name: Randall Bradley MRN: [id number] DOB: 1956-03-25 Today's Date: 06/15/15    History of Present Illness 59 y.o. male s/p Rt TKA. PMH: hypertension, cancer, Lt TKA    PT Comments    Pt continues to improve independence with ambulation, walking with a cane with no difficulties. Pt is on track to safely D/C when medically appropriate.   Follow Up Recommendations  Outpatient PT     Equipment Recommendations       Recommendations for Other Services       Precautions / Restrictions Precautions Precautions: Knee Restrictions RLE Weight Bearing: Weight bearing as tolerated    Mobility  Bed Mobility Overal bed mobility: Independent                Transfers Overall transfer level: Independent                  Ambulation/Gait Ambulation/Gait assistance: Modified independent (Device/Increase time) Ambulation Distance (Feet): 550 Feet Assistive device: Straight cane Gait Pattern/deviations: Step-through pattern;Decreased stride length   Gait velocity interpretation: Below normal speed for age/gender General Gait Details: cues for cane use and sequence, good stability   Stairs            Wheelchair Mobility    Modified Rankin (Stroke Patients Only)       Balance                                    Cognition Arousal/Alertness: Awake/alert Behavior During Therapy: WFL for tasks assessed/performed Overall Cognitive Status: Within Functional Limits for tasks assessed                      Exercises Total Joint Exercises Towel Squeeze: AROM;Both;10 reps;Seated Heel Slides: AROM;Right;15 reps;Supine Hip ABduction/ADduction: AROM;15 reps;Right;Supine Straight Leg Raises: AROM;Right;15 reps;Supine Long Arc Quad: AROM;Seated;Right;15 reps Goniometric ROM: 0-90 Marching in Standing: AROM;Seated;Both;15 reps    General Comments        Pertinent Vitals/Pain Pain Score: 6   Pain Location: right knee Pain Descriptors / Indicators: Aching;Sore Pain Intervention(s): Limited activity within patient's tolerance;Monitored during session;Repositioned;Patient requesting pain meds-RN notified    Home Living                      Prior Function            PT Goals (current goals can now be found in the care plan section) Progress towards PT goals: Progressing toward goals    Frequency       PT Plan Current plan remains appropriate    Co-evaluation             End of Session Equipment Utilized During Treatment: Gait belt Activity Tolerance: Patient tolerated treatment well Patient left: in chair;with call bell/phone within reach     Time: 0746-0802 PT Time Calculation (min) (ACUTE ONLY): 16 min  Charges:  $Gait Training: 8-22 mins                    G CodesHaynes Bast Jun 15, 2015, 8:41 AM Haynes Bast, SPT 2015/06/15 8:41 AM

## 2015-06-10 NOTE — Progress Notes (Signed)
Subjective: 2 Days Post-Op Procedure(s) (LRB): TOTAL RIGHT KNEE ARTHROPLASTY (Right) Patient reports pain as mild.  No nausea/vomiting, lightheadedness/dizziness, chest pain/sob.  Positive flatus/no bm.  Tolerating diet.  Objective: Vital signs in last 24 hours: Temp:  [97.6 F (36.4 C)-98.1 F (36.7 C)] 97.9 F (36.6 C) (12/16 0506) Pulse Rate:  [73-91] 73 (12/16 0506) Resp:  [15-16] 15 (12/16 0506) BP: (111-127)/(63-82) 125/82 mmHg (12/16 0506) SpO2:  [97 %-100 %] 99 % (12/16 0506)  Intake/Output from previous day: 12/15 0701 - 12/16 0700 In: 720 [P.O.:720] Out: 1250 [Urine:1250] Intake/Output this shift:     Recent Labs  06/09/15 0640 06/10/15 0506  HGB 12.6* 12.2*    Recent Labs  06/09/15 0640 06/10/15 0506  WBC 12.9* 12.2*  RBC 4.25 4.08*  HCT 38.0* 36.6*  PLT 176 177    Recent Labs  06/09/15 0640 06/10/15 0506  NA 139 139  K 4.8 3.8  CL 105 105  CO2 27 27  BUN 12 15  CREATININE 0.97 0.93  GLUCOSE 123* 111*  CALCIUM 8.8* 8.5*   No results for input(s): LABPT, INR in the last 72 hours.  Neurologically intact Neurovascular intact Sensation intact distally Intact pulses distally Dorsiflexion/Plantar flexion intact Compartment soft  Dressing changed by me today  Assessment/Plan: 2 Days Post-Op Procedure(s) (LRB): TOTAL RIGHT KNEE ARTHROPLASTY (Right) Advance diet Up with therapy Discharge home with home health after first session of PT WBAT RLE ABLA-mild and stable Dry dressing change prn  Fannie Knee 06/10/2015, 7:26 AM

## 2015-06-10 NOTE — Progress Notes (Signed)
Pt being discharged home via wheelchair with family. Pt alert and oriented x4. VSS. Pt c/o no pain at this time. IV pain medicine given this AM. No signs of respiratory distress. Education complete and care plans resolved. IV removed with catheter intact and pt tolerated well. No further issues at this time. Pt to follow up with PCP. Leanne Chang, RN

## 2015-06-28 ENCOUNTER — Ambulatory Visit (INDEPENDENT_AMBULATORY_CARE_PROVIDER_SITE_OTHER): Payer: BLUE CROSS/BLUE SHIELD | Admitting: Cardiovascular Disease

## 2015-06-28 ENCOUNTER — Encounter: Payer: Self-pay | Admitting: Cardiovascular Disease

## 2015-06-28 VITALS — BP 126/84 | HR 81 | Ht 74.0 in | Wt 208.6 lb

## 2015-06-28 DIAGNOSIS — I5189 Other ill-defined heart diseases: Secondary | ICD-10-CM

## 2015-06-28 DIAGNOSIS — I2510 Atherosclerotic heart disease of native coronary artery without angina pectoris: Secondary | ICD-10-CM

## 2015-06-28 DIAGNOSIS — Z8249 Family history of ischemic heart disease and other diseases of the circulatory system: Secondary | ICD-10-CM

## 2015-06-28 DIAGNOSIS — I10 Essential (primary) hypertension: Secondary | ICD-10-CM

## 2015-06-28 DIAGNOSIS — I119 Hypertensive heart disease without heart failure: Secondary | ICD-10-CM

## 2015-06-28 DIAGNOSIS — I519 Heart disease, unspecified: Secondary | ICD-10-CM

## 2015-06-28 NOTE — Patient Instructions (Signed)
Your physician wants you to follow-up in: 1 year or sooner if needed with Dr Kelly. You will receive a reminder letter in the mail two months in advance. If you don't receive a letter, please call our office to schedule the follow-up appointment.   If you need a refill on your cardiac medications before your next appointment, please call your pharmacy.   

## 2015-06-30 ENCOUNTER — Encounter: Payer: Self-pay | Admitting: Cardiovascular Disease

## 2015-06-30 DIAGNOSIS — I119 Hypertensive heart disease without heart failure: Secondary | ICD-10-CM | POA: Insufficient documentation

## 2015-06-30 DIAGNOSIS — I10 Essential (primary) hypertension: Secondary | ICD-10-CM | POA: Insufficient documentation

## 2015-06-30 DIAGNOSIS — I519 Heart disease, unspecified: Secondary | ICD-10-CM | POA: Insufficient documentation

## 2015-06-30 DIAGNOSIS — Z8249 Family history of ischemic heart disease and other diseases of the circulatory system: Secondary | ICD-10-CM | POA: Insufficient documentation

## 2015-06-30 DIAGNOSIS — I5189 Other ill-defined heart diseases: Secondary | ICD-10-CM | POA: Insufficient documentation

## 2015-06-30 NOTE — Progress Notes (Signed)
Patient ID: Randall Bradley, male   DOB: July 20, 1955, 60 y.o.   MRN: [id number]     Primary MD: Dr. Inda Merlin  PATIENT PROFILE: Randall Bradley is a 60 y.o. male who is self-referredin light of a significant family history for CAD, and recently being told of having diastolic dysfunction.  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   HPI:  Randall Bradley denies any known history of prior heart disease.  He has a history of hypertension for a proximally 3 years for which most recently he has been on losartan at 50 mg daily.  He also has a history of rosacea for which he takes minocycline 100 mg every evening.  He takes finasteride for hair loss.  In November 2016, he underwent initial left knee replacement surgery which he tolerated well.  However, he was told of having an increased heart rate in the postoperative period.  When I review the hospital records, I see heart rate in the 90s without any incidences of documented arrhythmia or significant ectopy.  He subsequently saw his primary physician, Dr. Mertha Finders, who referred him for an echo Doppler study which was done on 05/24/2015.  This revealed an ejection fraction of 55-60%.  There was evidence for mild left ventricular hypertrophy with normal systolic function.  Diastolic parameters suggested mild grade 1 diastolic dysfunction.  There was evidence for trivial mitral regurgitation, trivial tricuspid regurgitation and trivial pulmonic regurgitation.  PA pressure was normal.  On 06/08/2015.  He underwent right total knee replacement and tolerated this well with no mention of cardiac arrhythmia in the postoperative period.  Strong family history for heart disease with a father having suffered multiple myocardial infarctions and died at age 23.  He has a past medical history significant for radical prostatectomy in 2007 prostate cancer. He is followed by Dr. Buford Dresser for urologic care.  He also has a history of  hemochromatosis.  With his family history for CAD, he decided to seek Cardiologic evaluation.  He denies any chest pain.  He denies any PND orthopnea.  He denies any change in exercise tolerance and hopefully will be able to be more active now that he has had his bilateral knee replacements.  He denies any orthostatic symptoms.  H unaware of sleep disordered breathing.  He presents for evaluation.    Past Medical History  Diagnosis Date  . History of bladder stone   . Right ureteral stone   . Hereditary hemochromatosis ([place]) monitored by PCP  dr [name] gates    history of phlebotomies -- last one 2014 (pt states body has normalized)  . Hematuria   . Rosacea   . Alopecia   . Hypertension   . Kidney stones   . Prostate cancer ([place])     UROLOGIST-- DR Diona Fanti; S/P PROSTATECTOMY 03-17-2008  . Hemochromatosis   . Arthritis     "knees"    Past Surgical History  Procedure Laterality Date  . Appendectomy  1963  . Lipoma excision  03-04-2000    forehead  . Knee arthroscopy Left 1995  . Robot assisted laparoscopic radical prostatectomy  03-17-2008    w/  BILATERAL PELVIC LYMPHADENECTOMY (NON-NERVE SPARING)  . Complex wound closure Left 03-26-2009    INDEX FINGER COMPLEX WOUND LACERATION REPAIR  . Cystolitholapaxy and urethral dilation  10-05-2009  . Holmium laser application N/A 2/53/6644    Procedure: HOLMIUM LASER LITHOTRIPSY, ;  Surgeon: Franchot Gallo, MD;  Location: [facility];  Service: Urology;  Laterality: N/A;  . Cystoscopy w/ retrogrades Right 10/11/2014    Procedure: CYSTOSCOPY WITH RETROGRADE PYELOGRAM;  Surgeon: Franchot Gallo, MD;  Location: [facility];  Service: Urology;  Laterality: Right;  . Colonoscopy    . Total knee arthroplasty Left 04/27/2015    Procedure: TOTAL KNEE ARTHROPLASTY;  Surgeon: [name], MD;  Location: [place];  Service: Orthopedics;  Laterality: Left;  . Joint replacement    . Total knee arthroplasty Right  06/08/2015  . Total knee arthroplasty Right 06/08/2015    Procedure: TOTAL RIGHT KNEE ARTHROPLASTY;  Surgeon: [name], MD;  Location: [facility];  Service: Orthopedics;  Laterality: Right;    Allergies  Allergen Reactions  . Ropinirole Hcl Other (See Comments)    diffuse pruritus    Current Outpatient Prescriptions  Medication Sig Dispense Refill  . celecoxib (CELEBREX) 200 MG capsule Take 1 capsule (200 mg total) by mouth 2 (two) times daily. 60 capsule 1  . chlorpheniramine (CHLOR-TRIMETON) 4 MG tablet Take 4 mg by mouth 2 (two) times daily as needed for allergies.    . finasteride (PROPECIA) 1 MG tablet Take 1 mg by mouth daily.    Marland Kitchen losartan (COZAAR) 50 MG tablet Take 50 mg by mouth daily.    . methocarbamol (ROBAXIN) 500 MG tablet Take 1 tablet (500 mg total) by mouth 4 (four) times daily. 90 tablet 0  . minocycline (MINOCIN,DYNACIN) 100 MG capsule Take 100 mg by mouth every evening.    . Multiple Vitamin (MULTIVITAMIN WITH MINERALS) TABS tablet Take 1 tablet by mouth daily. Reported on 06/28/2015     No current facility-administered medications for this visit.    Social History   Social History  . Marital Status: Single    Spouse Name: N/A  . Number of Children: N/A  . Years of Education: N/A   Occupational History  . Not on file.   Social History Main Topics  . Smoking status: Former Smoker -- 0.50 packs/day for 15 years    Types: Cigarettes    Quit date: 10/07/1993  . Smokeless tobacco: Never Used  . Alcohol Use: Yes     Comment: "I quit drinking in ~ 2004"  . Drug Use: No  . Sexual Activity: No   Other Topics Concern  . Not on file   Social History Narrative   Additional social history is notable that h single.  Ther remote tobacco history.  He previously had worked for [employer], [employer] in [place], and spent 2 years in [place] in [employer]. No family history on file.   Family history is notable that his father died at 38,  but it suffered multiple myocardial infarctions.  His mother died at 77 and had undergone some 2 CABG operations.  He has 3 brothers with hypertension.  One brother also has daily prostate and thyroid cancer.    ROS General: Negative; No fevers, chills, or night sweats HEENT: Negative; No changes in vision or hearing, sinus congestion, difficulty swallowing Pulmonary: Negative; No cough, wheezing, shortness of breath, hemoptysis Cardiovascular:  See HPI; No chest pain, presyncope, syncope, palpitations, edema GI: Negative; No nausea, vomiting, diarrhea, or abdominal pain GU: Negative; No dysuria, hematuria, or difficulty voiding Musculoskeletal: Negative; no myalgias, joint pain, or weakness Hematologic/Oncologic: Negative; no easy bruising, bleeding Endocrine: Negative; no heat/cold intolerance; no diabetes Neuro: Negative; no changes in balance, headaches Skin: Negative; No rashes or skin lesions Psychiatric: Negative; No behavioral problems,  depression Sleep: Negative; No daytime sleepiness, hypersomnolence, bruxism, restless legs, hypnogagnic hallucinations Other comprehensive 14 point system review is negative   Physical Exam BP 126/84 mmHg  Pulse 81  Ht '6\' 2"'$  (1.88 m)  Wt 208 lb 9.6 oz (94.62 kg)  BMI 26.77 kg/m2  Wt Readings from Last 3 Encounters:  06/28/15 208 lb 9.6 oz (94.62 kg)  06/08/15 202 lb (91.627 kg)  05/27/15 202 lb 12.8 oz (91.989 kg)   General: Alert, oriented, no distress.  Skin: normal turgor, no rashes, warm and dry HEENT: Normocephalic, atraumatic. Pupils equal round and reactive to light; sclera anicteric; extraocular muscles intact; Fundi without hemorrhages or exudates. Nose without nasal septal hypertrophy Mouth/Parynx benign; Mallinpatti scale Neck: No JVD, no carotid bruits; normal carotid upstroke Lungs: clear to ausculatation and percussion; no wheezing or rales Chest wall: without tenderness to palpitation Heart: PMI not displaced, RRR, s1 s2  normal, 1/6 systolic murmur, no diastolic murmur, no rubs, gallops, thrills, or heaves Abdomen: soft, nontender; no hepatosplenomehaly, BS+; abdominal aorta nontender and not dilated by palpation. Back: no CVA tenderness Pulses 2+ Musculoskeletal: full range of motion, normal strength, no joint deformities Extremities: no clubbing cyanosis or edema, Homan's sign negative  Neurologic: grossly nonfocal; Cranial nerves grossly wnl Psychologic: Normal mood and affect   ECG (independently read by me): Normal sinus rhythm 81 bpm.  Normal intervals.  Early transition.  LABS:  BMP Latest Ref Rng 06/10/2015 06/09/2015 05/27/2015  Glucose 65 - 99 mg/dL 111(H) 123(H) 94  BUN 6 - 20 mg/dL '15 12 20  '$ Creatinine 0.61 - 1.24 mg/dL 0.93 0.97 0.92  Sodium 135 - 145 mmol/L 139 139 140  Potassium 3.5 - 5.1 mmol/L 3.8 4.8 3.9  Chloride 101 - 111 mmol/L 105 105 106  CO2 22 - 32 mmol/L '27 27 28  '$ Calcium 8.9 - 10.3 mg/dL 8.5(L) 8.8(L) 9.0     Hepatic Function Latest Ref Rng 05/27/2015 04/15/2015  Total Protein 6.5 - 8.1 g/dL 6.3(L) 6.5  Albumin 3.5 - 5.0 g/dL 3.6 4.0  AST 15 - 41 U/L 16 24  ALT 17 - 63 U/L 15(L) 19  Alk Phosphatase 38 - 126 U/L 84 81  Total Bilirubin 0.3 - 1.2 mg/dL 0.7 0.9    CBC Latest Ref Rng 06/10/2015 06/09/2015 05/27/2015  WBC 4.0 - 10.5 K/uL 12.2(H) 12.9(H) 5.9  Hemoglobin 13.0 - 17.0 g/dL 12.2(L) 12.6(L) 14.2  Hematocrit 39.0 - 52.0 % 36.6(L) 38.0(L) 42.3  Platelets 150 - 400 K/uL 177 176 199   Lab Results  Component Value Date   MCV 89.7 06/10/2015   MCV 89.4 06/09/2015   MCV 88.7 05/27/2015   No results found for: TSH No results found for: HGBA1C   BNP No results found for: BNP  ProBNP No results found for: PROBNP   Lipid Panel  No results found for: CHOL, TRIG, HDL, CHOLHDL, VLDL, LDLCALC, LDLDIRECT  RADIOLOGY: Dg Knee Right Port  06/08/2015  CLINICAL DATA:  Status post total knee replacement EXAM: PORTABLE RIGHT KNEE - 1-2 VIEW COMPARISON:  Right knee  MRI August 20, 2011 FINDINGS: Frontal and lateral views were obtained. Patient is status post total knee replacement with femoral and tibial prosthetic components appearing well-seated. No acute fracture or dislocation. Air in fluid within the joint is an expected postoperative finding. IMPRESSION: Prosthetic components appear well seated. No acute fracture or dislocation. Electronically Signed   By: Lowella Grip III M.D.   On: 06/08/2015 17:49     ASSESSMENT AND PLAN: Mr. Marshawn  Lograsso is a 60 year old white male who has a three-year history of hypertension and a recent echo Doppler study has demonstrated normal systolic function with moderate left particular hypertrophy and evidence for grade 1 diastolic dysfunction.  I reviewed his echo Doppler findings in detail with him.  He patient denies any anginal symptoms.  He does have strong family history for coronary artery disease in both parents.  His blood pressure today is controlled on his current regimen of losartan 50 mg daily.  Review of his medical records reveals that his heart rate did increase into the 90s following his initial left knee replacement, but I did not see any documentation of significant episodes of tachycardia dysrhythmia.  I discussed with him the contribution of high blood pressure with reference to left ventricular hypertrophy and diastolic dysfunction.  He is on losartan, which is beneficial.  Presently he is asymptomatic.  Laboratory has been obtained by his primary physician.  I will try to obtain these results.  With his family history for CAD and hypertension, I have suggested a one-year follow-up evaluation.  At this point, I do not feel Holter monitoring or stress testing is indicated.  If he has problems over the year he will will contact me for sooner evaluation.   Troy Sine, MD, [facility] 06/30/2015 11:16 PM

## 2015-08-01 DIAGNOSIS — M4180 Other forms of scoliosis, site unspecified: Secondary | ICD-10-CM | POA: Insufficient documentation

## 2015-10-17 DIAGNOSIS — J45909 Unspecified asthma, uncomplicated: Secondary | ICD-10-CM | POA: Diagnosis not present

## 2015-10-17 DIAGNOSIS — E781 Pure hyperglyceridemia: Secondary | ICD-10-CM | POA: Diagnosis not present

## 2015-10-17 DIAGNOSIS — Z0000 Encounter for general adult medical examination without abnormal findings: Secondary | ICD-10-CM | POA: Diagnosis not present

## 2015-10-17 DIAGNOSIS — J309 Allergic rhinitis, unspecified: Secondary | ICD-10-CM | POA: Diagnosis not present

## 2015-10-17 DIAGNOSIS — I10 Essential (primary) hypertension: Secondary | ICD-10-CM | POA: Diagnosis not present

## 2015-10-17 DIAGNOSIS — Z79899 Other long term (current) drug therapy: Secondary | ICD-10-CM | POA: Diagnosis not present

## 2016-05-16 DIAGNOSIS — J309 Allergic rhinitis, unspecified: Secondary | ICD-10-CM | POA: Diagnosis not present

## 2016-05-16 DIAGNOSIS — J45909 Unspecified asthma, uncomplicated: Secondary | ICD-10-CM | POA: Diagnosis not present

## 2016-05-16 DIAGNOSIS — J209 Acute bronchitis, unspecified: Secondary | ICD-10-CM | POA: Diagnosis not present

## 2016-08-09 ENCOUNTER — Ambulatory Visit (INDEPENDENT_AMBULATORY_CARE_PROVIDER_SITE_OTHER): Payer: BLUE CROSS/BLUE SHIELD | Admitting: Cardiovascular Disease

## 2016-08-09 ENCOUNTER — Encounter: Payer: Self-pay | Admitting: Cardiovascular Disease

## 2016-08-09 VITALS — BP 110/84 | HR 75 | Ht 74.0 in | Wt 205.0 lb

## 2016-08-09 DIAGNOSIS — Z8249 Family history of ischemic heart disease and other diseases of the circulatory system: Secondary | ICD-10-CM

## 2016-08-09 DIAGNOSIS — Z79899 Other long term (current) drug therapy: Secondary | ICD-10-CM

## 2016-08-09 DIAGNOSIS — Z8546 Personal history of malignant neoplasm of prostate: Secondary | ICD-10-CM

## 2016-08-09 DIAGNOSIS — I10 Essential (primary) hypertension: Secondary | ICD-10-CM

## 2016-08-09 NOTE — Patient Instructions (Signed)
Your physician recommends that you return for lab work fasting.   Your physician wants you to follow-up in: 1 year or sooner if needed. You will receive a reminder letter in the mail two months in advance. If you don't receive a letter, please call our office to schedule the follow-up appointment.  If you need a refill on your cardiac medications before your next appointment, please call your pharmacy.    

## 2016-08-09 NOTE — Progress Notes (Signed)
Patient ID: Randall Bradley, male   DOB: [birth year], 61 y.o.   MRN: [id number]     Primary MD: Randall. Inda Merlin  PATIENT PROFILE: Randall Bradley is a 61 y.o. male who is self-referredin light of a significant family history for CAD, and recently being told of having diastolic dysfunction.  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  He presents for one-year follow-up evaluation.  HPI:  Randall Bradley denies any known history of prior heart disease.  He has a history of hypertension for a ~4 years for which most recently he has been on losartan at 50 mg daily.  He also has a history of rosacea for which he takes minocycline 100 mg every evening.  He takes finasteride for hair loss.  In November 2016, he underwent initial left knee replacement surgery which he tolerated well.  However, he was told of having an increased heart rate in the postoperative period.  When I review the hospital records,  heart rates were in the 90s without any incidences of documented arrhythmia or significant ectopy.  He subsequently saw his primary physician, Randall Bradley, who referred him for an echo Doppler study which was done on 05/24/2015.  This revealed an ejection fraction of 55-60%.  There was evidence for mild left ventricular hypertrophy with normal systolic function.  Diastolic parameters suggested mild grade 1 diastolic dysfunction.  There was evidence for trivial mitral regurgitation, trivial tricuspid regurgitation and trivial pulmonic regurgitation.  PA pressure was normal.  On 06/08/2015 he underwent right total knee replacement and tolerated this well with no mention of cardiac arrhythmia in the postoperative period. He has a strong family history for heart disease with a father having suffered multiple myocardial infarctions and died at age 64.  He has a past medical history significant for radical prostatectomy in 2007 prostate cancer. He is followed by Randall Bradley for urologic care.  He also has a history of  hemochromatosis.   Since I last saw him one year ago, he has remained stable from a cardiac standpoint.  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  He also has several older cows and breeds.  Since his knee replacements he has no restrictions in his movement.  He denies any chest pain.  He denies any PND orthopnea.  He denies any change in exercise tolerance and hopefully will be able to be more active now that he has had his bilateral knee replacements.  He denies any orthostatic symptoms.  H unaware of sleep disordered breathing.  He presents for evaluation.   Past Medical History:  Diagnosis Date  . Alopecia   . Arthritis    "knees"  . Hematuria   . Hemochromatosis   . Hereditary hemochromatosis ([place]) monitored by PCP  Randall [name] gates   history of phlebotomies -- last one 2014 (pt states body has normalized)  . History of bladder stone   . Hypertension   . Kidney stones   . Prostate cancer ([place])    UROLOGIST-- Randall Bradley; S/P PROSTATECTOMY 03-17-2008  . Right ureteral stone   . Rosacea     Past Surgical History:  Procedure Laterality Date  . APPENDECTOMY  1963  . COLONOSCOPY    . COMPLEX WOUND CLOSURE Left 03-26-2009   INDEX FINGER COMPLEX WOUND LACERATION REPAIR  . CYSTOLITHOLAPAXY AND URETHRAL  DILATION  10-05-2009  . CYSTOSCOPY W/ RETROGRADES Right 10/11/2014   Procedure: CYSTOSCOPY WITH RETROGRADE PYELOGRAM;  Surgeon: Franchot Gallo, MD;  Location[facility];  Service: Urology;  Laterality: Right;  . HOLMIUM LASER APPLICATION N/A 10/01/8117   Procedure: HOLMIUM LASER LITHOTRIPSY, ;  Surgeon: Franchot Gallo, MD;  Location: [facility];  Service: Urology;  Laterality: N/A;  . JOINT REPLACEMENT    . KNEE ARTHROSCOPY Left 1995  . LIPOMA EXCISION  03-04-2000   forehead  . ROBOT ASSISTED LAPAROSCOPIC RADICAL  PROSTATECTOMY  03-17-2008   w/  BILATERAL PELVIC LYMPHADENECTOMY (NON-NERVE SPARING)  . TOTAL KNEE ARTHROPLASTY Left 04/27/2015   Procedure: TOTAL KNEE ARTHROPLASTY;  Surgeon: [name], MD;  Location: [place];  Service: Orthopedics;  Laterality: Left;  . TOTAL KNEE ARTHROPLASTY Right 06/08/2015  . TOTAL KNEE ARTHROPLASTY Right 06/08/2015   Procedure: TOTAL RIGHT KNEE ARTHROPLASTY;  Surgeon: [name], MD;  Location: [place];  Service: Orthopedics;  Laterality: Right;    Allergies  Allergen Reactions  . Ropinirole Hcl Other (See Comments)    diffuse pruritus    Current Outpatient Prescriptions  Medication Sig Dispense Refill  . chlorpheniramine (CHLOR-TRIMETON) 4 MG tablet Take 4 mg by mouth 2 (two) times daily as needed for allergies.    . finasteride (PROPECIA) 1 MG tablet Take 1 mg by mouth daily.    Marland Kitchen losartan (COZAAR) 50 MG tablet Take 50 mg by mouth daily.    . minocycline (MINOCIN,DYNACIN) 100 MG capsule Take 100 mg by mouth every evening.    . Multiple Vitamin (MULTIVITAMIN WITH MINERALS) TABS tablet Take 1 tablet by mouth daily. Reported on 06/28/2015    . Naproxen Sodium (ALEVE PO) Take 1 tablet by mouth daily.     No current facility-administered medications for this visit.     Social History   Social History  . Marital status: Single    Spouse name: N/A  . Number of children: N/A  . Years of education: N/A   Occupational History  . Not on file.   Social History Main Topics  . Smoking status: Former Smoker    Packs/day: 0.50    Years: 15.00    Types: Cigarettes    Quit date: 10/07/1993  . Smokeless tobacco: Never Used  . Alcohol use Yes     Comment: "I quit drinking in ~ 2004"  . Drug use: No  . Sexual activity: No   Other Topics Concern  . Not on file   Social History Narrative  . No narrative on file   Additional social history is notable that heis single.  Ther remote tobacco history.  He previously had worked for [employer], [employer] in [place], and spent 2 years in [place] in [employer].  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  History reviewed. No pertinent family history.   Family history is notable that his father died at 36, but it suffered multiple myocardial infarctions.  His mother died at 23 and had undergone some 2 CABG operations.  He has 3 brothers with hypertension.  One brother also has daily prostate and thyroid cancer.  ROS General: Negative; No fevers, chills, or night sweats HEENT: Negative; No changes in vision or hearing, sinus congestion, difficulty swallowing Pulmonary: Negative; No cough, wheezing, shortness of breath, hemoptysis Cardiovascular:  See HPI; No chest pain, presyncope, syncope, palpitations, edema GI: Negative; No nausea, vomiting, diarrhea, or abdominal pain GU: Negative; No dysuria, hematuria, or difficulty voiding  Musculoskeletal: Negative; no myalgias, joint pain, or weakness Hematologic/Oncologic: Negative; no easy bruising, bleeding Endocrine: Negative; no heat/cold intolerance; no diabetes Neuro: Negative; no changes in balance, headaches Skin: Negative; No rashes or skin lesions Psychiatric: Negative; No behavioral problems, depression Sleep: Negative; No daytime sleepiness, hypersomnolence, bruxism, restless legs, hypnogagnic hallucinations Other comprehensive 14 point system review is negative   Physical Exam BP 110/84   Pulse 75   Ht _0  (1.88 m)   Wt 205 lb (93 kg)   BMI 26.32 kg/m   Wt Readings from Last 3 Encounters:  08/09/16 205 lb (93 kg)  06/28/15 208 lb 9.6 oz (94.6 kg)  06/08/15 202 lb (91.6 kg)   General: Alert, oriented, no distress.  Skin: normal turgor, no rashes, warm and dry HEENT: Normocephalic, atraumatic. Pupils equal round and reactive to light; sclera anicteric; extraocular muscles intact; Fundi without hemorrhages or exudates. Nose without nasal septal hypertrophy Mouth/Parynx benign; Mallinpatti  scale Neck: No JVD, no carotid bruits; normal carotid upstroke Lungs: clear to ausculatation and percussion; no wheezing or rales Chest wall: without tenderness to palpitation Heart: PMI not displaced, RRR, s1 s2 normal, 1/6 systolic murmur, no diastolic murmur, no rubs, gallops, thrills, or heaves Abdomen: soft, nontender; no hepatosplenomehaly, BS+; abdominal aorta nontender and not dilated by palpation. Back: no CVA tenderness Pulses 2+ Musculoskeletal: full Bradley of motion, normal strength, no joint deformities Extremities: no clubbing cyanosis or edema, Homan's sign negative  Neurologic: grossly nonfocal; Cranial nerves grossly wnl Psychologic: Normal mood and affect  ECG (independently read by me): Normal sinus rhythm at 75 bpm.  Normal intervals.  No ST segment changes.  No ectopy.  January 2017 ECG (independently read by me): Normal sinus rhythm 81 bpm.  Normal intervals.  Early transition.  LABS:  BMP Latest Ref Rng & Units 06/10/2015 06/09/2015 05/27/2015  Glucose 65 - 99 mg/dL 111(H) 123(H) 94  BUN 6 - 20 mg/dL _1 Creatinine 0.61 - 1.24 mg/dL 0.93 0.97 0.92  Sodium 135 - 145 mmol/L 139 139 140  Potassium 3.5 - 5.1 mmol/L 3.8 4.8 3.9  Chloride 101 - 111 mmol/L 105 105 106  CO2 22 - 32 mmol/L _2 Calcium 8.9 - 10.3 mg/dL 8.5(L) 8.8(L) 9.0     Hepatic Function Latest Ref Rng & Units 05/27/2015 04/15/2015  Total Protein 6.5 - 8.1 g/dL 6.3(L) 6.5  Albumin 3.5 - 5.0 g/dL 3.6 4.0  AST 15 - 41 U/L 16 24  ALT 17 - 63 U/L 15(L) 19  Alk Phosphatase 38 - 126 U/L 84 81  Total Bilirubin 0.3 - 1.2 mg/dL 0.7 0.9    CBC Latest Ref Rng & Units 06/10/2015 06/09/2015 05/27/2015  WBC 4.0 - 10.5 K/uL 12.2(H) 12.9(H) 5.9  Hemoglobin 13.0 - 17.0 g/dL 12.2(L) 12.6(L) 14.2  Hematocrit 39.0 - 52.0 % 36.6(L) 38.0(L) 42.3  Platelets 150 - 400 K/uL 177 176 199   Lab Results  Component Value Date   MCV 89.7 06/10/2015   MCV 89.4 06/09/2015   MCV 88.7 05/27/2015   No results  found for: TSH No results found for: HGBA1C   BNP No results found for: BNP  ProBNP No results found for: PROBNP   Lipid Panel  No results found for: CHOL, TRIG, HDL, CHOLHDL, VLDL, LDLCALC, LDLDIRECT  RADIOLOGY: No results found.   IMPRESSION:  1. Essential hypertension   2. Family history of heart disease   3. Drug therapy   4. Personal history of prostate cancer  ASSESSMENT AND PLAN: Mr. Yardley Lekas is a 61 year old white male who has a history of hypertension and an echo Doppler study has demonstrated normal systolic function with moderate left particular hypertrophy and evidence for grade 1 diastolic dysfunction.  I reviewed his echo Doppler findings in detail with him.  He patient denies any anginal symptoms.  He does have strong family history for coronary artery disease in both parents.  His blood pressure is well controlled today on losartan 50 mg daily.  He also takes aspirin 81 mg.  Continues to take finasteride 1 mg.  He is not having any exertional chest pain or shortness of breath.  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  His echo Doppler study had shown LVH with grade 1 diastolic dysfunction and most likely is contributed by his hypertension.  He denies dyspnea or any change in symptomatology.  I am recommending a complete set of fasting laboratory.  I will contact him with results and adjustments to his medical regimen will be done if necessary.  With his strong family history for premature CAD, I will see him in one year for cardiology reevaluation.  Troy Sine, MD, [facility] 08/09/2016 6:47 PM

## 2016-10-08 DIAGNOSIS — I10 Essential (primary) hypertension: Secondary | ICD-10-CM | POA: Diagnosis not present

## 2016-10-08 DIAGNOSIS — N5201 Erectile dysfunction due to arterial insufficiency: Secondary | ICD-10-CM | POA: Diagnosis not present

## 2016-10-08 DIAGNOSIS — Z79899 Other long term (current) drug therapy: Secondary | ICD-10-CM | POA: Diagnosis not present

## 2016-10-08 DIAGNOSIS — C61 Malignant neoplasm of prostate: Secondary | ICD-10-CM | POA: Diagnosis not present

## 2016-10-08 DIAGNOSIS — Z8249 Family history of ischemic heart disease and other diseases of the circulatory system: Secondary | ICD-10-CM | POA: Diagnosis not present

## 2016-10-08 LAB — CBC
HEMATOCRIT: 43.4 % (ref 38.5–50.0)
HEMOGLOBIN: 14.7 g/dL (ref 13.2–17.1)
MCH: 30.1 pg (ref 27.0–33.0)
MCHC: 33.9 g/dL (ref 32.0–36.0)
MCV: 88.9 fL (ref 80.0–100.0)
MPV: 9.6 fL (ref 7.5–12.5)
Platelets: 183 10*3/uL (ref 140–400)
RBC: 4.88 MIL/uL (ref 4.20–5.80)
RDW: 13.9 % (ref 11.0–15.0)
WBC: 6.3 10*3/uL (ref 3.8–10.8)

## 2016-10-09 LAB — COMPREHENSIVE METABOLIC PANEL
ALT: 15 U/L (ref 9–46)
AST: 19 U/L (ref 10–35)
Albumin: 3.9 g/dL (ref 3.6–5.1)
Alkaline Phosphatase: 67 U/L (ref 40–115)
BUN: 25 mg/dL (ref 7–25)
CALCIUM: 9.2 mg/dL (ref 8.6–10.3)
CHLORIDE: 106 mmol/L (ref 98–110)
CO2: 23 mmol/L (ref 20–31)
Creat: 0.84 mg/dL (ref 0.70–1.25)
Glucose, Bld: 92 mg/dL (ref 65–99)
Potassium: 4.5 mmol/L (ref 3.5–5.3)
Sodium: 143 mmol/L (ref 135–146)
Total Bilirubin: 0.5 mg/dL (ref 0.2–1.2)
Total Protein: 6.5 g/dL (ref 6.1–8.1)

## 2016-10-09 LAB — LIPID PANEL
Cholesterol: 156 mg/dL (ref <200)
HDL: 48 mg/dL (ref >40)
LDL CALC: 82 mg/dL (ref <100)
Total CHOL/HDL Ratio: 3.3 Ratio (ref <5.0)
Triglycerides: 128 mg/dL (ref <150)
VLDL: 26 mg/dL (ref <30)

## 2016-10-09 LAB — TSH: TSH: 3 mIU/L (ref 0.40–4.50)

## 2016-10-17 ENCOUNTER — Encounter: Payer: Self-pay | Admitting: *Deleted

## 2016-12-29 IMAGING — DX DG KNEE 1-2V PORT*L*
2 series · 2 of 2 positions shown · non-contrast
Comparison: No priors.

CLINICAL DATA: 59-year-old male status post left total knee
arthroplasty.

EXAM:
PORTABLE LEFT KNEE - 1-2 VIEW

[knee ap]
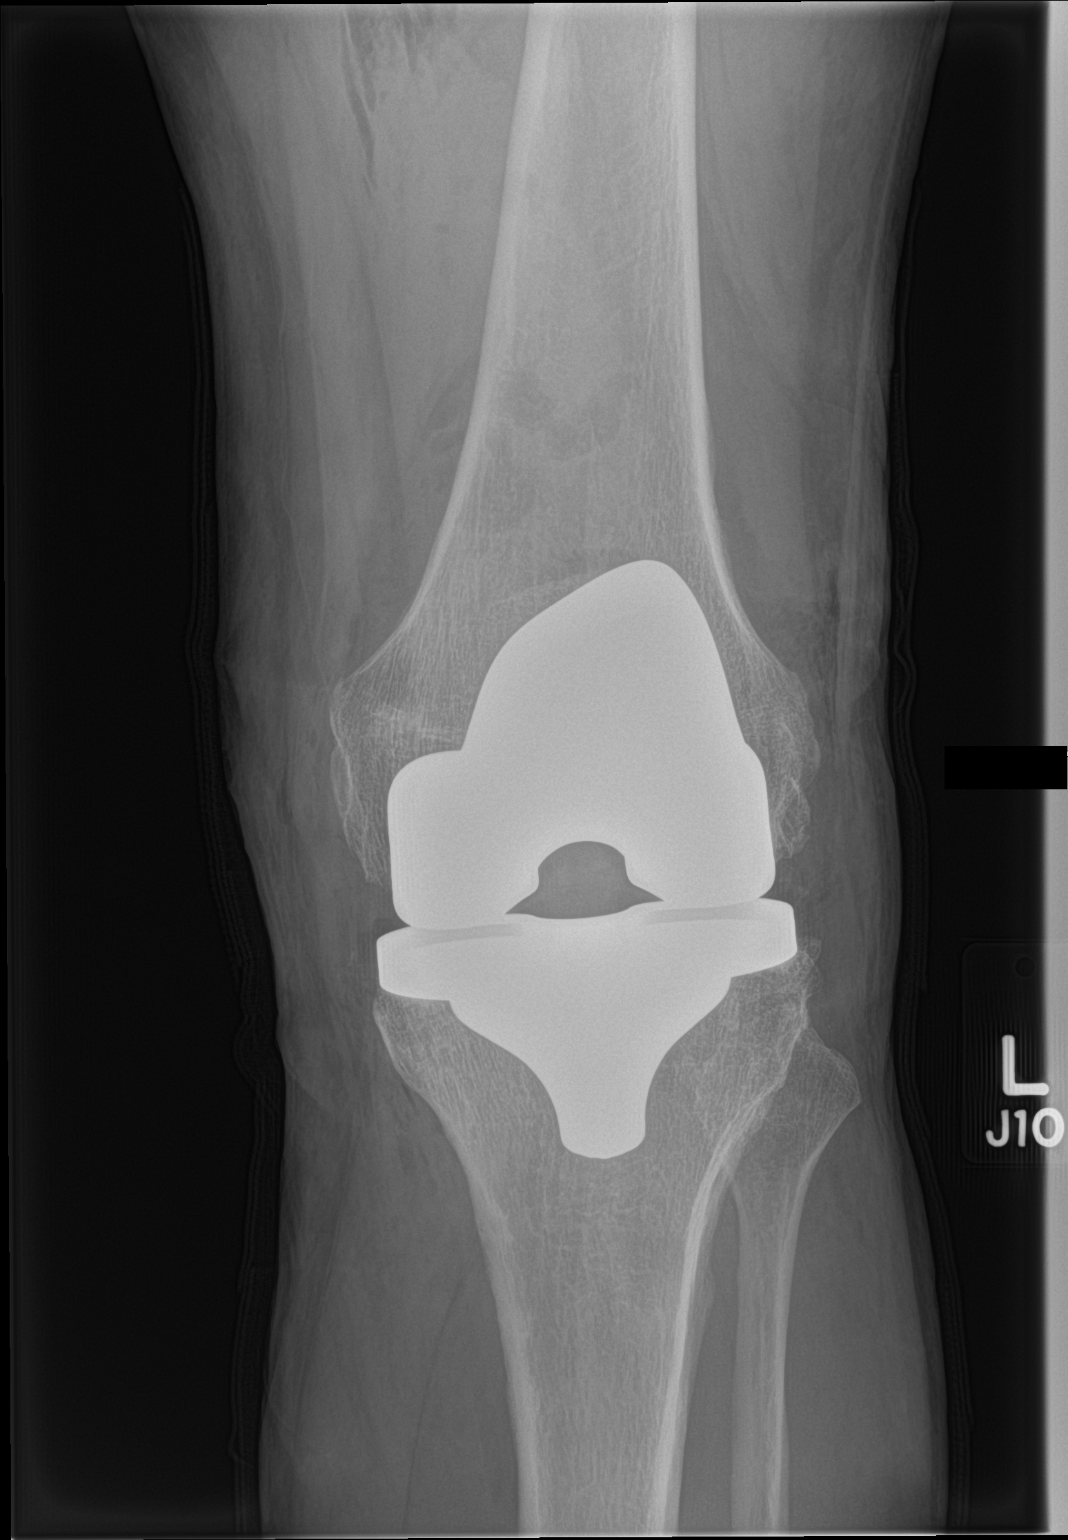

[knee lat]
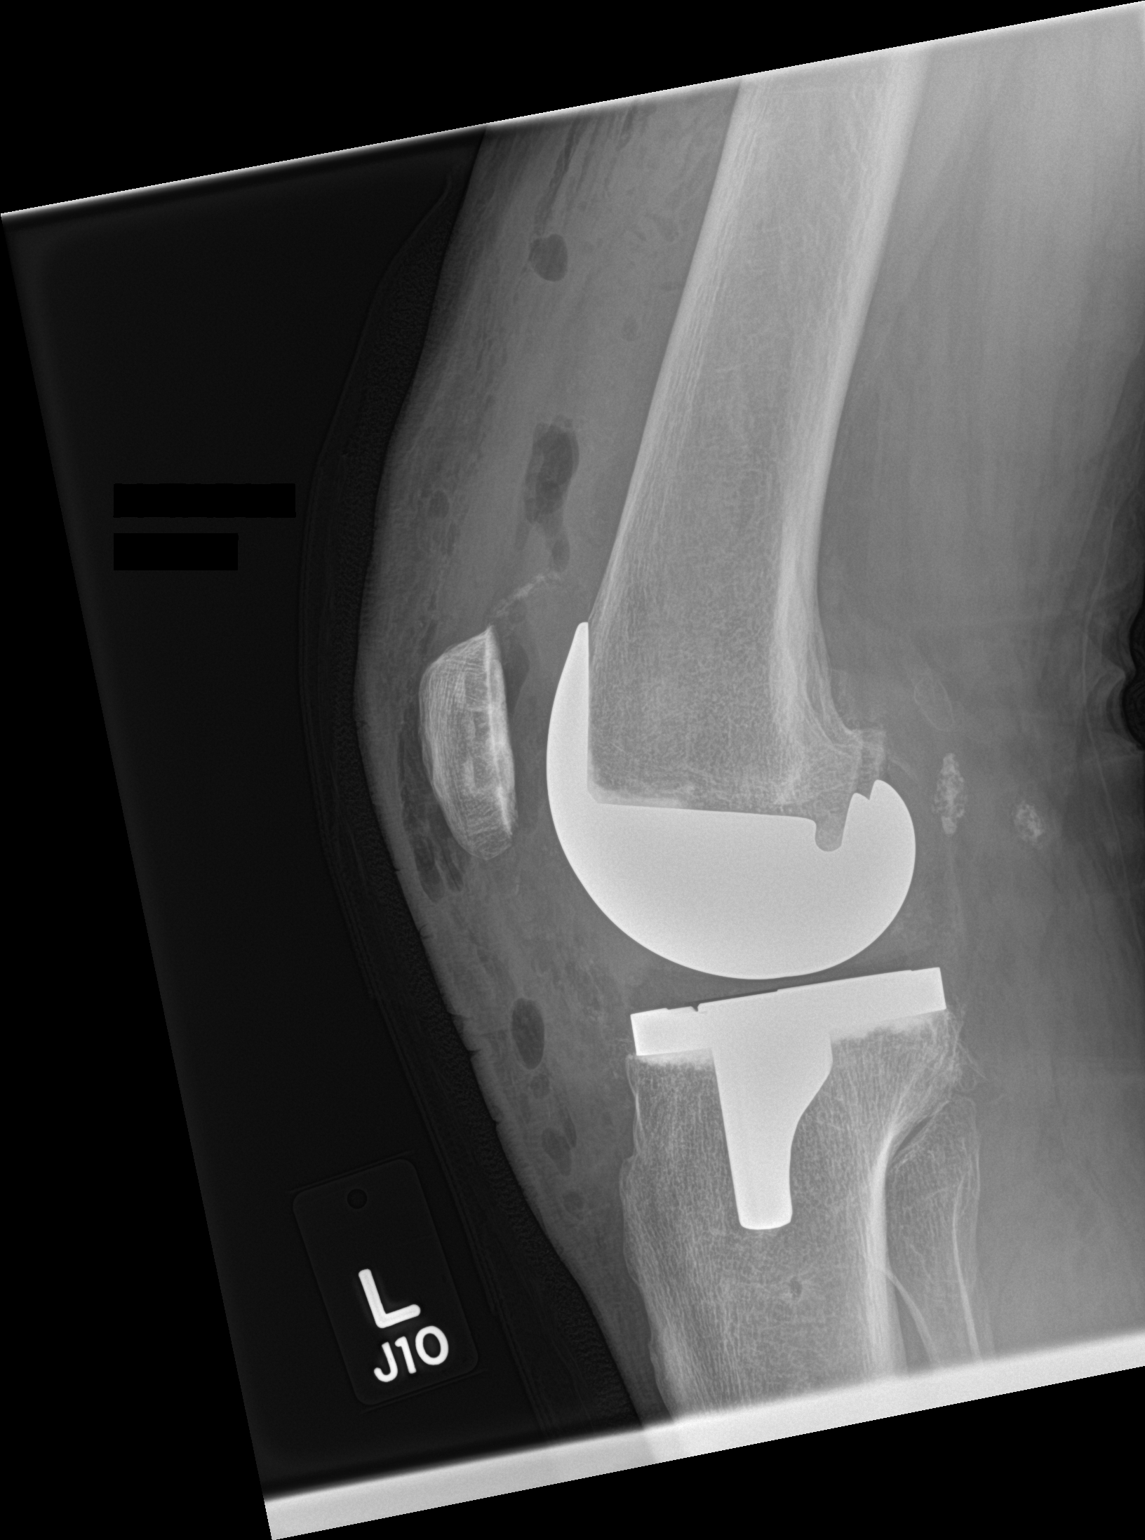

[2 of 2 positions shown; findings below may reference images not displayed]

FINDINGS: Two views of the left knee demonstrate postoperative changes of left
total knee arthroplasty. The femoral and tibial components of the
prosthesis appear properly seated without definite periprosthetic
fracture or other acute abnormalities. Gas is present in the joint
space and the overlying soft tissues.
IMPRESSION: 1. Expected postoperative findings following left total knee
arthroplasty, without acute complicating features, as above.

## 2017-07-11 DIAGNOSIS — L821 Other seborrheic keratosis: Secondary | ICD-10-CM | POA: Diagnosis not present

## 2017-07-11 DIAGNOSIS — L570 Actinic keratosis: Secondary | ICD-10-CM | POA: Diagnosis not present

## 2017-07-11 DIAGNOSIS — L718 Other rosacea: Secondary | ICD-10-CM | POA: Diagnosis not present

## 2017-07-11 DIAGNOSIS — L820 Inflamed seborrheic keratosis: Secondary | ICD-10-CM | POA: Diagnosis not present

## 2017-09-13 ENCOUNTER — Ambulatory Visit: Payer: BLUE CROSS/BLUE SHIELD | Admitting: Cardiovascular Disease

## 2017-09-16 ENCOUNTER — Encounter: Payer: Self-pay | Admitting: Cardiovascular Disease

## 2017-09-16 ENCOUNTER — Ambulatory Visit (INDEPENDENT_AMBULATORY_CARE_PROVIDER_SITE_OTHER): Payer: BLUE CROSS/BLUE SHIELD | Admitting: Cardiovascular Disease

## 2017-09-16 VITALS — BP 136/92 | HR 69 | Ht 74.0 in | Wt 205.6 lb

## 2017-09-16 DIAGNOSIS — I10 Essential (primary) hypertension: Secondary | ICD-10-CM | POA: Diagnosis not present

## 2017-09-16 DIAGNOSIS — Z8249 Family history of ischemic heart disease and other diseases of the circulatory system: Secondary | ICD-10-CM | POA: Diagnosis not present

## 2017-09-16 DIAGNOSIS — Z8546 Personal history of malignant neoplasm of prostate: Secondary | ICD-10-CM | POA: Diagnosis not present

## 2017-09-16 MED ORDER — LOSARTAN POTASSIUM 50 MG PO TABS: 75.0000 mg | ORAL_TABLET | Freq: Every day | ORAL | 3 refills | Status: DC

## 2017-09-16 NOTE — Patient Instructions (Signed)
Medication Instructions:  INCREASE Losartan to 75 mg daily  Follow-Up: Your physician wants you to follow-up in: 12 months with Dr. Claiborne Billings.  You will receive a reminder letter in the mail two months in advance. If you don't receive a letter, please call our office to schedule the follow-up appointment.   Any Other Special Instructions Will Be Listed Below (If Applicable).     If you need a refill on your cardiac medications before your next appointment, please call your pharmacy.

## 2017-09-16 NOTE — Progress Notes (Signed)
Patient ID: Randall Bradley, male   DOB: [birth year], 62 y.o.   MRN: [id number]     Primary MD: Dr. Inda Merlin  PATIENT PROFILE: Randall Bradley is a 62 y.o. male who was initially self-referred due to a significant family history for CAD, and recently being told of having diastolic dysfunction.  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  He presents for a 13 month follow-up evaluation.  HPI:  Randall Bradley denies any known history of prior heart disease.  He has a history of hypertension for a ~4 years for which most recently he has been on losartan at 50 mg daily.  He also has a history of rosacea for which he takes minocycline 100 mg every evening.  He takes finasteride for hair loss.  In November 2016, he underwent initial left knee replacement surgery which he tolerated well.  However, he was told of having an increased heart rate in the postoperative period.  When I review the hospital records,  heart rates were in the 90s without any incidences of documented arrhythmia or significant ectopy.  He subsequently saw his primary physician, Dr. Mertha Finders, who referred him for an echo Doppler study which was done on 05/24/2015.  This revealed an ejection fraction of 55-60%.  There was evidence for mild left ventricular hypertrophy with normal systolic function.  Diastolic parameters suggested mild grade 1 diastolic dysfunction.  There was evidence for trivial mitral regurgitation, trivial tricuspid regurgitation and trivial pulmonic regurgitation.  PA pressure was normal.  On 06/08/2015 he underwent right total knee replacement and tolerated this well with no mention of cardiac arrhythmia in the postoperative period. He has a strong family history for heart disease with a father having suffered multiple myocardial infarctions and died at age 48.  He has a past medical history significant for radical prostatectomy in 2007 prostate cancer. He is followed by Dr. Buford Dresser for urologic care.  He also has a history  of hemochromatosis.   When I last saw him in February 2018.  He was remaining stable  from a cardiac standpoint.  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 He has several older cows for breeding.   Over the past year, he remains very active.  He denies chest pain, PND, orthopnea.  He denies palpitations.  Since his knee replacements he has no restrictions in his movement.  He denies any difficulty with sleep. He had the flu in December.  He works out at least 3 days per week. He presents for a 13 month follow-up evaluation.  Past Medical History:  Diagnosis Date  . Alopecia   . Arthritis    "knees"  . Hematuria   . Hemochromatosis   . Hereditary hemochromatosis ([place]) monitored by PCP  dr [name] gates   history of phlebotomies -- last one 2014 (pt states body has normalized)  . History of bladder stone   . Hypertension   . Kidney stones   . Prostate cancer ([place])    UROLOGIST-- DR Diona Fanti; S/P PROSTATECTOMY 03-17-2008  . Right ureteral stone   . Rosacea     Past Surgical History:  Procedure Laterality Date  . APPENDECTOMY  1963  . COLONOSCOPY    . COMPLEX WOUND CLOSURE Left 03-26-2009   INDEX FINGER COMPLEX WOUND LACERATION REPAIR  .  CYSTOLITHOLAPAXY AND URETHRAL DILATION  10-05-2009  . CYSTOSCOPY W/ RETROGRADES Right 10/11/2014   Procedure: CYSTOSCOPY WITH RETROGRADE PYELOGRAM;  Surgeon: Franchot Gallo, MD;  Location: [facility];  Service: Urology;  Laterality: Right;  . HOLMIUM LASER APPLICATION N/A 2/69/4854   Procedure: HOLMIUM LASER LITHOTRIPSY, ;  Surgeon: Franchot Gallo, MD;  [facility];  Service: Urology;  Laterality: N/A;  . JOINT REPLACEMENT    . KNEE ARTHROSCOPY Left 1995  . LIPOMA EXCISION  03-04-2000   forehead  . ROBOT ASSISTED LAPAROSCOPIC RADICAL  PROSTATECTOMY  03-17-2008   w/  BILATERAL PELVIC LYMPHADENECTOMY (NON-NERVE SPARING)  . TOTAL KNEE ARTHROPLASTY Left 04/27/2015   Procedure: TOTAL KNEE ARTHROPLASTY;  Surgeon: [name], MD;  Location: [facility];  Service: Orthopedics;  Laterality: Left;  . TOTAL KNEE ARTHROPLASTY Right 06/08/2015  . TOTAL KNEE ARTHROPLASTY Right 06/08/2015   Procedure: TOTAL RIGHT KNEE ARTHROPLASTY;  Surgeon: [name], MD;  Location: [facility];  Service: Orthopedics;  Laterality: Right;    Allergies  Allergen Reactions  . Ropinirole Hcl Other (See Comments)    diffuse pruritus    Current Outpatient Medications  Medication Sig Dispense Refill  . ampicillin (PRINCIPEN) 500 MG capsule Take 500 mg by mouth 2 (two) times daily.    . cyproheptadine (PERIACTIN) 4 MG tablet Take 4 mg by mouth 2 (two) times daily as needed for allergies.    . finasteride (PROPECIA) 1 MG tablet Take 1 mg by mouth daily.    Marland Kitchen losartan (COZAAR) 50 MG tablet Take 1.5 tablets (75 mg total) by mouth daily. 135 tablet 3  . Multiple Vitamin (MULTIVITAMIN WITH MINERALS) TABS tablet Take 1 tablet by mouth daily. Reported on 06/28/2015    . Naproxen Sodium (ALEVE PO) Take 1 tablet by mouth daily.    . ranitidine (ZANTAC) 150 MG tablet Take 150 mg by mouth at bedtime.     No current facility-administered medications for this visit.     Social History   Socioeconomic History  . Marital status: Single    Spouse name: Not on file  . Number of children: Not on file  . Years of education: Not on file  . Highest education level: Not on file  Occupational History  . Not on file  Social Needs  . Financial resource strain: Not on file  . Food insecurity:    Worry: Not on file    Inability: Not on file  . Transportation needs:    Medical: Not on file    Non-medical: Not on file  Tobacco Use  . Smoking status: Former Smoker    Packs/day: 0.50    Years: 15.00    Pack years: 7.50    Types: Cigarettes    Last attempt to quit:  10/07/1993    Years since quitting: 23.9  . Smokeless tobacco: Never Used  Substance and Sexual Activity  . Alcohol use: Yes    Comment: "I quit drinking in ~ 2004"  . Drug use: No  . Sexual activity: Never  Lifestyle  . Physical activity:    Days per week: Not on file    Minutes per session: Not on file  . Stress: Not on file  Relationships  . Social connections:    Talks on phone: Not on file    Gets together: Not on file    Attends religious service: Not on file    Active member of club or organization: Not on file    Attends meetings  of clubs or organizations: Not on file    Relationship status: Not on file  . Intimate partner violence:    Fear of current or ex partner: Not on file    Emotionally abused: Not on file    Physically abused: Not on file    Forced sexual activity: Not on file  Other Topics Concern  . Not on file  Social History Narrative  . Not on file   Additional social history is notable that he is single.  Ther remote tobacco history.  He previously had worked for [employer], [employer] in [place], and spent 2 years in [place] in [employer].  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  History reviewed. No pertinent family history.   Family history is notable that his father died at 57, but it suffered multiple myocardial infarctions.  His mother died at 42 and had undergone some 2 CABG operations.  He has 3 brothers with hypertension.  One brother also has daily prostate and thyroid cancer.  ROS General: Negative; No fevers, chills, or night sweats HEENT: Negative; No changes in vision or hearing, sinus congestion, difficulty swallowing Pulmonary: Negative; No cough, wheezing, shortness of breath, hemoptysis Cardiovascular:  See HPI; No chest pain, presyncope, syncope, palpitations, edema GI: Negative; No nausea, vomiting, diarrhea, or abdominal pain GU: Negative; No dysuria, hematuria, or difficulty voiding Musculoskeletal:  Negative; no myalgias, joint pain, or weakness Hematologic/Oncologic: Negative; no easy bruising, bleeding Endocrine: Negative; no heat/cold intolerance; no diabetes Neuro: Negative; no changes in balance, headaches Skin: Negative; No rashes or skin lesions Psychiatric: Negative; No behavioral problems, depression Sleep: Negative; No daytime sleepiness, hypersomnolence, bruxism, restless legs, hypnogagnic hallucinations Other comprehensive 14 point system review is negative   Physical Exam BP (!) 136/92   Pulse 69   Ht _0  (1.88 m)   Wt 205 lb 9.6 oz (93.3 kg)   BMI 26.40 kg/m   Repeat blood pressure by me was 128/84.  Wt Readings from Last 3 Encounters:  09/16/17 205 lb 9.6 oz (93.3 kg)  08/09/16 205 lb (93 kg)  06/28/15 208 lb 9.6 oz (94.6 kg)   General: Alert, oriented, no distress.  Skin: normal turgor, no rashes, warm and dry HEENT: Normocephalic, atraumatic. Pupils equal round and reactive to light; sclera anicteric; extraocular muscles intact;  Nose without nasal septal hypertrophy Mouth/Parynx benign; Mallinpatti scale 2 Neck: No JVD, no carotid bruits; normal carotid upstroke Lungs: clear to ausculatation and percussion; no wheezing or rales Chest wall: without tenderness to palpitation Heart: PMI not displaced, RRR, s1 s2 normal, 1/6 systolic murmur, no diastolic murmur, no rubs, gallops, thrills, or heaves Abdomen: soft, nontender; no hepatosplenomehaly, BS+; abdominal aorta nontender and not dilated by palpation. Back: no CVA tenderness Pulses 2+ Musculoskeletal: full range of motion, normal strength, no joint deformities Extremities: no clubbing cyanosis or edema, Homan's sign negative  Neurologic: grossly nonfocal; Cranial nerves grossly wnl Psychologic: Normal mood and affect   ECG (independently read by me): normal sinus rhythm at 69 bpm.  No ectopy.  Normal intervals.  No ST segment changes.  February 2018 ECG (independently read by me): Normal sinus  rhythm at 75 bpm.  Normal intervals.  No ST segment changes.  No ectopy.  January 2017 ECG (independently read by me): Normal sinus rhythm 81 bpm.  Normal intervals.  Early transition.  LABS:  BMP Latest Ref Rng & Units 10/08/2016 06/10/2015 06/09/2015  Glucose 65 - 99 mg/dL 92 111(H) 123(H)  BUN 7 - 25  mg/dL _0 Creatinine 0.70 - 1.25 mg/dL 0.84 0.93 0.97  Sodium 135 - 146 mmol/L 143 139 139  Potassium 3.5 - 5.3 mmol/L 4.5 3.8 4.8  Chloride 98 - 110 mmol/L 106 105 105  CO2 20 - 31 mmol/L _1 Calcium 8.6 - 10.3 mg/dL 9.2 8.5(L) 8.8(L)     Hepatic Function Latest Ref Rng & Units 10/08/2016 05/27/2015 04/15/2015  Total Protein 6.1 - 8.1 g/dL 6.5 6.3(L) 6.5  Albumin 3.6 - 5.1 g/dL 3.9 3.6 4.0  AST 10 - 35 U/L _2 ALT 9 - 46 U/L 15 15(L) 19  Alk Phosphatase 40 - 115 U/L 67 84 81  Total Bilirubin 0.2 - 1.2 mg/dL 0.5 0.7 0.9    CBC Latest Ref Rng & Units 10/08/2016 06/10/2015 06/09/2015  WBC 3.8 - 10.8 K/uL 6.3 12.2(H) 12.9(H)  Hemoglobin 13.2 - 17.1 g/dL 14.7 12.2(L) 12.6(L)  Hematocrit 38.5 - 50.0 % 43.4 36.6(L) 38.0(L)  Platelets 140 - 400 K/uL 183 177 176   Lab Results  Component Value Date   MCV 88.9 10/08/2016   MCV 89.7 06/10/2015   MCV 89.4 06/09/2015   Lab Results  Component Value Date   TSH 3.00 10/08/2016   No results found for: HGBA1C   BNP No results found for: BNP  ProBNP No results found for: PROBNP   Lipid Panel     Component Value Date/Time   CHOL 156 10/08/2016 1423   TRIG 128 10/08/2016 1423   HDL 48 10/08/2016 1423   CHOLHDL 3.3 10/08/2016 1423   VLDL 26 10/08/2016 1423   LDLCALC 82 10/08/2016 1423    RADIOLOGY: No results found.   IMPRESSION:  1. Essential hypertension   2. Family history of heart disease   3. Personal history of prostate cancer     ASSESSMENT AND PLAN: Mr. Tajai Ihde is a 62 year old white male who has a history of hypertension and significant family history for CAD. He has been demonstrated  to have normal systolic function with moderate left particular hypertrophy and evidence for grade 1 diastolic dysfunction on echo Doppler. Blood pressure today is stable and on repeat by me was 128/84.  On his current dose of losartan 50 mg daily.  He has GERD for which he takes ranitidine over-the-counter at bedtime.  Laboratory from April 2016 had shown a total cholesterol 156 with an LDL of 82, triglycerides 128 and HDL of 48.  Dr. Mertha Finders is his primary physician who will be rechecking laboratory in April.  With her strong family history for CAD, I suggested a target LDL of less than 70 if at all possible.  He continues to exercise regularly and is asymptomatic.  He is followed by Dr. Beatrix Fetters with his history of prostate cancer. As long as he does well, I will see him in one year for reevaluation.  Troy Sine, MD, [facility] 09/17/2017 2:58 PM

## 2017-09-17 ENCOUNTER — Encounter: Payer: Self-pay | Admitting: Cardiovascular Disease

## 2017-11-13 DIAGNOSIS — Z8546 Personal history of malignant neoplasm of prostate: Secondary | ICD-10-CM | POA: Diagnosis not present

## 2017-11-13 DIAGNOSIS — Z79899 Other long term (current) drug therapy: Secondary | ICD-10-CM | POA: Diagnosis not present

## 2017-11-13 DIAGNOSIS — C61 Malignant neoplasm of prostate: Secondary | ICD-10-CM | POA: Diagnosis not present

## 2017-11-13 DIAGNOSIS — E781 Pure hyperglyceridemia: Secondary | ICD-10-CM | POA: Diagnosis not present

## 2017-11-13 DIAGNOSIS — I10 Essential (primary) hypertension: Secondary | ICD-10-CM | POA: Diagnosis not present

## 2017-11-13 DIAGNOSIS — R6882 Decreased libido: Secondary | ICD-10-CM | POA: Diagnosis not present

## 2017-11-13 DIAGNOSIS — N5231 Erectile dysfunction following radical prostatectomy: Secondary | ICD-10-CM | POA: Diagnosis not present

## 2017-11-13 DIAGNOSIS — J45909 Unspecified asthma, uncomplicated: Secondary | ICD-10-CM | POA: Diagnosis not present

## 2017-11-13 DIAGNOSIS — M158 Other polyosteoarthritis: Secondary | ICD-10-CM | POA: Diagnosis not present

## 2017-11-13 DIAGNOSIS — Z0000 Encounter for general adult medical examination without abnormal findings: Secondary | ICD-10-CM | POA: Diagnosis not present

## 2018-03-03 DIAGNOSIS — H2513 Age-related nuclear cataract, bilateral: Secondary | ICD-10-CM | POA: Diagnosis not present

## 2018-03-27 DIAGNOSIS — R05 Cough: Secondary | ICD-10-CM | POA: Diagnosis not present

## 2018-03-27 DIAGNOSIS — J309 Allergic rhinitis, unspecified: Secondary | ICD-10-CM | POA: Diagnosis not present

## 2018-03-27 DIAGNOSIS — J45909 Unspecified asthma, uncomplicated: Secondary | ICD-10-CM | POA: Diagnosis not present

## 2018-05-29 ENCOUNTER — Ambulatory Visit (INDEPENDENT_AMBULATORY_CARE_PROVIDER_SITE_OTHER): Payer: BLUE CROSS/BLUE SHIELD

## 2018-05-29 ENCOUNTER — Ambulatory Visit (INDEPENDENT_AMBULATORY_CARE_PROVIDER_SITE_OTHER): Payer: BLUE CROSS/BLUE SHIELD | Admitting: Podiatry

## 2018-05-29 ENCOUNTER — Encounter: Payer: Self-pay | Admitting: Podiatry

## 2018-05-29 ENCOUNTER — Other Ambulatory Visit: Payer: Self-pay | Admitting: Podiatry

## 2018-05-29 VITALS — BP 106/64 | HR 95 | Resp 16

## 2018-05-29 DIAGNOSIS — J069 Acute upper respiratory infection, unspecified: Secondary | ICD-10-CM | POA: Insufficient documentation

## 2018-05-29 DIAGNOSIS — R5383 Other fatigue: Secondary | ICD-10-CM | POA: Insufficient documentation

## 2018-05-29 DIAGNOSIS — M79671 Pain in right foot: Secondary | ICD-10-CM

## 2018-05-29 DIAGNOSIS — C61 Malignant neoplasm of prostate: Secondary | ICD-10-CM | POA: Insufficient documentation

## 2018-05-29 DIAGNOSIS — M159 Polyosteoarthritis, unspecified: Secondary | ICD-10-CM | POA: Insufficient documentation

## 2018-05-29 DIAGNOSIS — M79672 Pain in left foot: Secondary | ICD-10-CM

## 2018-05-29 DIAGNOSIS — G2589 Other specified extrapyramidal and movement disorders: Secondary | ICD-10-CM | POA: Insufficient documentation

## 2018-05-29 DIAGNOSIS — Z96659 Presence of unspecified artificial knee joint: Secondary | ICD-10-CM | POA: Insufficient documentation

## 2018-05-29 DIAGNOSIS — R05 Cough: Secondary | ICD-10-CM | POA: Insufficient documentation

## 2018-05-29 DIAGNOSIS — J45909 Unspecified asthma, uncomplicated: Secondary | ICD-10-CM | POA: Insufficient documentation

## 2018-05-29 DIAGNOSIS — Z0000 Encounter for general adult medical examination without abnormal findings: Secondary | ICD-10-CM | POA: Insufficient documentation

## 2018-05-29 DIAGNOSIS — D3610 Benign neoplasm of peripheral nerves and autonomic nervous system, unspecified: Secondary | ICD-10-CM

## 2018-05-29 DIAGNOSIS — G4701 Insomnia due to medical condition: Secondary | ICD-10-CM | POA: Insufficient documentation

## 2018-05-29 DIAGNOSIS — Z79899 Other long term (current) drug therapy: Secondary | ICD-10-CM | POA: Insufficient documentation

## 2018-05-29 DIAGNOSIS — R053 Chronic cough: Secondary | ICD-10-CM | POA: Insufficient documentation

## 2018-05-29 DIAGNOSIS — E7800 Pure hypercholesterolemia, unspecified: Secondary | ICD-10-CM | POA: Insufficient documentation

## 2018-05-29 DIAGNOSIS — J309 Allergic rhinitis, unspecified: Secondary | ICD-10-CM | POA: Insufficient documentation

## 2018-05-29 DIAGNOSIS — R630 Anorexia: Secondary | ICD-10-CM | POA: Insufficient documentation

## 2018-05-29 DIAGNOSIS — M25561 Pain in right knee: Secondary | ICD-10-CM | POA: Insufficient documentation

## 2018-05-29 DIAGNOSIS — R059 Cough, unspecified: Secondary | ICD-10-CM | POA: Insufficient documentation

## 2018-05-29 DIAGNOSIS — Z86010 Personal history of colonic polyps: Secondary | ICD-10-CM | POA: Insufficient documentation

## 2018-05-29 DIAGNOSIS — K4090 Unilateral inguinal hernia, without obstruction or gangrene, not specified as recurrent: Secondary | ICD-10-CM | POA: Insufficient documentation

## 2018-05-29 NOTE — Progress Notes (Signed)
Subjective:   Patient ID: Randall Bradley, male   DOB: 62 y.o.   MRN: [id number]   HPI Patient states that he is getting this shooting pain between the third and fourth toe on his left foot and its been present for around 10 years and is known he is needed to do something but he just has not been able to because of time that he now can get it fixed.  Patient states it radiates and is difficult to wear shoe gear and at times it pops and makes him take his shoes off.  Patient does not smoke and likes to be active   Review of Systems  All other systems reviewed and are negative.       Objective:  Physical Exam  Constitutional: He appears well-developed and well-nourished.  Cardiovascular: Intact distal pulses.  Pulmonary/Chest: Effort normal.  Musculoskeletal: Normal range of motion.  Neurological: He is alert.  Skin: Skin is warm.  Nursing note and vitals reviewed.   Neurovascular status intact muscle strength is adequate range of motion within normal limits with patient found to have a shooting pain third interspace left with radiating discomfort into the adjacent digits and positive Mulder sign noted.  Patient has good digital perfusion is well oriented x3     Assessment:  All symptoms indicate neuroma symptomatology left     Plan:  Reviewed case and options in great detail.  Patient is known as needed surgery but he now has the chance to do it and wants surgery and needs to get it done soon.  I did allow him to read consent form for neurectomy and I explained procedure risk and the fact there is no long-term guarantee this will solve his problem.  Patient is amendable to this understanding risk and understanding recovery and signed consent form after extensive review and understands total recovery can take 4 to 6 months.  Patient is encouraged to call with any questions he may have and is scheduled for outpatient surgery for this procedure

## 2018-05-29 NOTE — Patient Instructions (Addendum)
Morton Neuralgia Morton neuralgia is a type of foot pain in the area closest to your toes. This area is sometimes called the ball of your foot. Morton neuralgia occurs when a branch of a nerve in your foot (digital nerve) becomes compressed. When this happens over a long period of time, the nerve can thicken (neuroma) and cause pain. This usually occurs between the third and fourth toe. Morton neuralgia can come and go but may get worse over time. What are the causes? Your digital nerve can become compressed and stretched at a point where it passes under a thick band of tissue that connects your toes (intermetatarsal ligament). Morton neuralgia can be caused by mild repetitive damage in this area. This type of damage can result from:  Activities such as running or jumping.  Wearing shoes that are too tight.  What increases the risk? You may be at risk for Morton neuralgia if you:  Are male.  Wear high heels.  Wear shoes that are narrow or tight.  Participate in activities that stretch your toes. These include: ? Running. ? [place]. ? Long-distance walking.  What are the signs or symptoms? The first symptom of Morton neuralgia is pain that spreads from the ball of your foot to your toes. It may feel like you are walking on a marble. Pain usually gets worse with walking and goes away at night. Other symptoms may include numbness and cramping of your toes. How is this diagnosed? Your health care provider will do a physical exam. When doing the exam, your health care provider may:  Squeeze your foot just behind your toe.  Ask you to move your toes to check for pain.  You may also have tests on your foot to confirm the diagnosis. These may include:  An X-ray.  An MRI.  How is this treated? Treatment for Morton neuralgia may be as simple as changing the kind of shoes you wear. Other treatments may include:  Wearing a supportive pad (orthosis) under the front of your foot. This  lifts your toe bones and takes pressure off the nerve.  Getting injections of numbing medicine and anti-inflammatory medicine (steroid) in the nerve.  Having surgery to remove part of the thickened nerve.  Follow these instructions at home:  Take medicine only as directed by your health care provider.  Wear soft-soled shoes with a wide toe area.  Stop activities that may be causing pain.  Elevate your foot when resting.  Massage your foot.  Apply ice to the injured area: ? Put ice in a plastic bag. ? Place a towel between your skin and the bag. ? Leave the ice on for 20 minutes, 2-3 times a day.  Keep all follow-up visits as directed by your health care provider. This is important. Contact a health care provider if:  Home care instructions are not helping you get better.  Your symptoms change or get worse. This information is not intended to replace advice given to you by your health care provider. Make sure you discuss any questions you have with your health care provider. Document Released: 09/17/2000 Document Revised: 11/17/2015 Document Reviewed: 08/12/2013 Elsevier Interactive Patient Education  2018 [place] [facility] Instructions  Congratulations, you have decided to take an important step towards improving your quality of life.  You can be assured that the doctors and staff at [facility] will be with you every step of the way.  Here are some important things you should  know:  1. Plan to be at the surgery center/hospital at least 1 (one) hour prior to your scheduled time, unless otherwise directed by the surgical center/hospital staff.  You must have a responsible adult accompany you, remain during the surgery and drive you home.  Make sure you have directions to the surgical center/hospital to ensure you arrive on time. 2. If you are having surgery at [facility] or [facility], you will need a copy of your medical history and physical form  from your family physician within one month prior to the date of surgery. We will give you a form for your primary physician to complete.  3. We make every effort to accommodate the date you request for surgery.  However, there are times where surgery dates or times have to be moved.  We will contact you as soon as possible if a change in schedule is required.   4. No aspirin/ibuprofen for one week before surgery.  If you are on aspirin, any non-steroidal anti-inflammatory medications (Mobic, Aleve, Ibuprofen) should not be taken seven (7) days prior to your surgery.  You make take Tylenol for pain prior to surgery.  5. Medications - If you are taking daily heart and blood pressure medications, seizure, reflux, allergy, asthma, anxiety, pain or diabetes medications, make sure you notify the surgery center/hospital before the day of surgery so they can tell you which medications you should take or avoid the day of surgery. 6. No food or drink after midnight the night before surgery unless directed otherwise by surgical center/hospital staff. 7. No alcoholic beverages [id number] prior to surgery.  No smoking 24-hours prior or 24-hours after surgery. 8. Wear loose pants or shorts. They should be loose enough to fit over bandages, boots, and casts. 9. Don't wear slip-on shoes. Sneakers are preferred. 10. Bring your boot with you to the surgery center/hospital.  Also bring crutches or a walker if your physician has prescribed it for you.  If you do not have this equipment, it will be provided for you after surgery. 11. If you have not been contacted by the surgery center/hospital by the day before your surgery, call to confirm the date and time of your surgery. 12. Leave-time from work may vary depending on the type of surgery you have.  Appropriate arrangements should be made prior to surgery with your employer. 13. Prescriptions will be provided immediately following surgery by your doctor.  Fill these as soon  as possible after surgery and take the medication as directed. Pain medications will not be refilled on weekends and must be approved by the doctor. 14. Remove nail polish on the operative foot and avoid getting pedicures prior to surgery. 15. Wash the night before surgery.  The night before surgery wash the foot and leg well with water and the antibacterial soap provided. Be sure to pay special attention to beneath the toenails and in between the toes.  Wash for at least three (3) minutes. Rinse thoroughly with water and dry well with a towel.  Perform this wash unless told not to do so by your physician.  Enclosed: 1 Ice pack (please put in freezer the night before surgery)   1 Hibiclens skin cleaner   Pre-op instructions  If you have any questions regarding the instructions, please do not hesitate to call our office.  [place]: [address], [place], [place] [address] -- [place]: [address], [place], [place] [address] -- [phone]  [place]: [place], [place]  [address] -- [phone]  [place]: [address] [place], [place], [place] [address] -- [phone]  Website: [email or web address]

## 2018-05-29 NOTE — Progress Notes (Signed)
   Subjective:    Patient ID: Randall Bradley, male    DOB: 1955-12-01, 62 y.o.   MRN: [id number]  HPI    Review of Systems  All other systems reviewed and are negative.      Objective:   Physical Exam        Assessment & Plan:

## 2018-05-30 DIAGNOSIS — C61 Malignant neoplasm of prostate: Secondary | ICD-10-CM | POA: Diagnosis not present

## 2018-05-30 DIAGNOSIS — R3915 Urgency of urination: Secondary | ICD-10-CM | POA: Diagnosis not present

## 2018-05-30 DIAGNOSIS — L738 Other specified follicular disorders: Secondary | ICD-10-CM | POA: Diagnosis not present

## 2018-05-30 DIAGNOSIS — L430 Hypertrophic lichen planus: Secondary | ICD-10-CM | POA: Diagnosis not present

## 2018-05-30 DIAGNOSIS — L820 Inflamed seborrheic keratosis: Secondary | ICD-10-CM | POA: Diagnosis not present

## 2018-05-30 DIAGNOSIS — N3943 Post-void dribbling: Secondary | ICD-10-CM | POA: Diagnosis not present

## 2018-05-30 DIAGNOSIS — N393 Stress incontinence (female) (male): Secondary | ICD-10-CM | POA: Diagnosis not present

## 2018-05-30 DIAGNOSIS — R350 Frequency of micturition: Secondary | ICD-10-CM | POA: Diagnosis not present

## 2018-05-30 DIAGNOSIS — L245 Irritant contact dermatitis due to other chemical products: Secondary | ICD-10-CM | POA: Diagnosis not present

## 2018-06-17 ENCOUNTER — Encounter: Payer: Self-pay | Admitting: Podiatry

## 2018-06-17 DIAGNOSIS — I10 Essential (primary) hypertension: Secondary | ICD-10-CM | POA: Diagnosis not present

## 2018-06-17 DIAGNOSIS — G588 Other specified mononeuropathies: Secondary | ICD-10-CM | POA: Diagnosis not present

## 2018-06-17 DIAGNOSIS — G5762 Lesion of plantar nerve, left lower limb: Secondary | ICD-10-CM | POA: Diagnosis not present

## 2018-06-17 DIAGNOSIS — M799 Soft tissue disorder, unspecified: Secondary | ICD-10-CM | POA: Diagnosis not present

## 2018-06-20 ENCOUNTER — Encounter: Payer: Self-pay | Admitting: Podiatry

## 2018-06-26 ENCOUNTER — Ambulatory Visit (INDEPENDENT_AMBULATORY_CARE_PROVIDER_SITE_OTHER): Payer: Self-pay | Admitting: Podiatry

## 2018-06-26 VITALS — Temp 96.5°F

## 2018-06-26 DIAGNOSIS — M79671 Pain in right foot: Secondary | ICD-10-CM

## 2018-06-26 DIAGNOSIS — M79672 Pain in left foot: Secondary | ICD-10-CM

## 2018-06-26 DIAGNOSIS — D3610 Benign neoplasm of peripheral nerves and autonomic nervous system, unspecified: Secondary | ICD-10-CM

## 2018-06-28 NOTE — Progress Notes (Signed)
Subjective: Randall Bradley is a 63 y.o. is seen today in office s/p left foot excision of neuroma preformed on 12/242019.  Overall he states he is doing well and his pain is controlled.  He is having no issues.  Remain in a surgical shoe.  Denies any systemic complaints such as fevers, chills, nausea, vomiting. No calf pain, chest pain, shortness of breath.   Objective: General: No acute distress, AAOx3  DP/PT pulses palpable 2/4, CRT < 3 sec to all digits.  Protective sensation intact. Motor function intact.  LEFT foot: Incision is well coapted without any evidence of dehiscence and sutures are intact. There is a faint rim of surrounding erythema likely from moderate inflammation as opposed to infection as there is no increasing warmth, ascending cellulitis, fluctuance, crepitus, malodor, drainage/purulence. There is minimal edema around the surgical site. There is no pain along the surgical site.  No other areas of tenderness to bilateral lower extremities.  No other open lesions or pre-ulcerative lesions.  No pain with calf compression, swelling, warmth, erythema.   Assessment and Plan:  Status post left foot neuroma excision performed by Dr. Paulla Dolly, doing well with no complications   -Treatment options discussed including all alternatives, risks, and complications -This time except for showers incisions healing very well.  Recommend dry sterile Anaplasma mild antibiotic ointment and a bandage.  Remain in the surgical shoe for now. -Ice/elevation -Pain medication as needed. -Monitor for any clinical signs or symptoms of infection and DVT/PE and directed to call the office immediately should any occur or go to the ER. -Follow-up as scheduled with Dr. Paulla Dolly for suture removal or sooner if any problems arise. In the meantime, encouraged to call the office with any questions, concerns, change in symptoms.   Celesta Gentile, DPM

## 2018-07-09 ENCOUNTER — Ambulatory Visit (INDEPENDENT_AMBULATORY_CARE_PROVIDER_SITE_OTHER): Payer: BLUE CROSS/BLUE SHIELD | Admitting: Podiatry

## 2018-07-09 DIAGNOSIS — D3610 Benign neoplasm of peripheral nerves and autonomic nervous system, unspecified: Secondary | ICD-10-CM

## 2018-07-09 DIAGNOSIS — Z09 Encounter for follow-up examination after completed treatment for conditions other than malignant neoplasm: Secondary | ICD-10-CM

## 2018-07-09 NOTE — Progress Notes (Signed)
Subjective:   Patient ID: Randall Bradley, male   DOB: 63 y.o.   MRN: [id number]   HPI Patient presents stating my left foot is doing really well and I am wearing shoe gear now   ROS      Objective:  Physical Exam  Neurovascular status intact with patient's left foot doing well wound edges well coapted and wearing shoes     Assessment:  Doing well after neurectomy left     Plan:  Compression stocking dispensed instructed on continued wider shoes gradual increase in activities and will be seen back as needed

## 2018-08-26 ENCOUNTER — Other Ambulatory Visit: Payer: Self-pay | Admitting: Cardiovascular Disease

## 2018-08-27 ENCOUNTER — Telehealth: Payer: Self-pay

## 2018-08-27 MED ORDER — LOSARTAN POTASSIUM 50 MG PO TABS: 75.0000 mg | ORAL_TABLET | Freq: Every day | ORAL | 3 refills | Status: DC

## 2018-08-27 NOTE — Telephone Encounter (Signed)
After receiving fax from pharmacy, that Losartan was on backorder. I advised with pharmacist and they suggested for patient to try a different pharmacy than [facility]. Patient gave another pharmacy, medication was sent there. Patient advised to call if any other issues.

## 2018-09-08 DIAGNOSIS — M545 Low back pain: Secondary | ICD-10-CM | POA: Diagnosis not present

## 2018-09-16 ENCOUNTER — Ambulatory Visit: Payer: BLUE CROSS/BLUE SHIELD | Admitting: Physical Therapy

## 2018-10-21 ENCOUNTER — Other Ambulatory Visit: Payer: Self-pay

## 2018-10-21 ENCOUNTER — Other Ambulatory Visit (HOSPITAL_COMMUNITY): Payer: Self-pay | Admitting: *Deleted

## 2018-10-22 ENCOUNTER — Ambulatory Visit (HOSPITAL_COMMUNITY)
Admission: RE | Admit: 2018-10-22 | Discharge: 2018-10-22 | Disposition: A | Discharge status: Home / Self Care | Payer: BLUE CROSS/BLUE SHIELD | Source: Ambulatory Visit | Attending: Internal Medicine | Admitting: Internal Medicine

## 2018-10-22 DIAGNOSIS — M545 Low back pain: Secondary | ICD-10-CM | POA: Insufficient documentation

## 2018-11-19 ENCOUNTER — Ambulatory Visit: Payer: BLUE CROSS/BLUE SHIELD | Attending: Internal Medicine | Admitting: Physical Therapy

## 2018-11-19 ENCOUNTER — Ambulatory Visit (HOSPITAL_COMMUNITY)
Admission: RE | Admit: 2018-11-19 | Discharge: 2018-11-19 | Disposition: A | Discharge status: Home / Self Care | Payer: BLUE CROSS/BLUE SHIELD | Source: Ambulatory Visit | Attending: Internal Medicine | Admitting: Internal Medicine

## 2018-11-19 ENCOUNTER — Other Ambulatory Visit: Payer: Self-pay

## 2018-11-19 ENCOUNTER — Encounter: Payer: Self-pay | Admitting: Physical Therapy

## 2018-11-19 DIAGNOSIS — M545 Low back pain, unspecified: Secondary | ICD-10-CM

## 2018-11-19 DIAGNOSIS — M546 Pain in thoracic spine: Secondary | ICD-10-CM | POA: Diagnosis not present

## 2018-11-19 DIAGNOSIS — G8929 Other chronic pain: Secondary | ICD-10-CM | POA: Diagnosis not present

## 2018-11-19 NOTE — Therapy (Signed)
[place], [place], [address] Phone: [phone]   Fax:  [phone]  Physical Therapy Evaluation  Patient Details  Name: Randall Bradley MRN: [id number] Date of Birth: 06-14-1956 Referring Provider (PT): [name], MD   Encounter Date: 11/19/2018  PT End of Session - 11/19/18 1456    Visit Number  1    Number of Visits  13    Date for PT Re-Evaluation  01/02/19    Authorization Type  BCBS    PT Start Time  1400    PT Stop Time  1440    PT Time Calculation (min)  40 min    Activity Tolerance  Patient tolerated treatment well    Behavior During Therapy  [facility] for tasks assessed/performed       Past Medical History:  Diagnosis Date  . Alopecia   . Arthritis    "knees"  . Hematuria   . Hemochromatosis   . Hereditary hemochromatosis ([place]) monitored by PCP  dr [name] gates   history of phlebotomies -- last one 2014 (pt states body has normalized)  . History of bladder stone   . Hypertension   . Kidney stones   . Prostate cancer ([place])    UROLOGIST-- DR Diona Fanti; S/P PROSTATECTOMY 03-17-2008  . Right ureteral stone   . Rosacea     Past Surgical History:  Procedure Laterality Date  . APPENDECTOMY  1963  . COLONOSCOPY    . COMPLEX WOUND CLOSURE Left 03-26-2009   INDEX FINGER COMPLEX WOUND LACERATION REPAIR  . CYSTOLITHOLAPAXY AND URETHRAL DILATION  10-05-2009  . CYSTOSCOPY W/ RETROGRADES Right 10/11/2014   Procedure: CYSTOSCOPY WITH RETROGRADE PYELOGRAM;  Surgeon: Franchot Gallo, MD;  Location: [facility];  Service: Urology;  Laterality: Right;  . HOLMIUM LASER APPLICATION N/A 2/54/2706   Procedure: HOLMIUM LASER LITHOTRIPSY, ;  Surgeon: Franchot Gallo, MD;  Location: [facility];  Service: Urology;  Laterality: N/A;  . JOINT REPLACEMENT    . KNEE ARTHROSCOPY Left 1995  . LIPOMA EXCISION  03-04-2000   forehead  . ROBOT ASSISTED LAPAROSCOPIC RADICAL  PROSTATECTOMY  03-17-2008   w/  BILATERAL PELVIC LYMPHADENECTOMY (NON-NERVE SPARING)  . TOTAL KNEE ARTHROPLASTY Left 04/27/2015   Procedure: TOTAL KNEE ARTHROPLASTY;  Surgeon: [name], MD;  Location: [place];  Service: Orthopedics;  Laterality: Left;  . TOTAL KNEE ARTHROPLASTY Right 06/08/2015  . TOTAL KNEE ARTHROPLASTY Right 06/08/2015   Procedure: TOTAL RIGHT KNEE ARTHROPLASTY;  Surgeon: [name], MD;  Location: [place];  Service: Orthopedics;  Laterality: Right;    There were no vitals filed for this visit.   Subjective Assessment - 11/19/18 1404    Subjective  30 yr ago squatting, I pulled something in Rt lower back (pointing around SIJ). Stretching quads is very helpful. Pain has been on/off. Across lower thoracic spine, laying on hard surface is uncomfortable. sometimes has discomfort in mid spine with deep breath, made worse with slumping.  In last year, toes on both sides have become numb.     Patient Stated Goals  decrease pain, weight lifting, stay physically fit, bike riding    Currently in Pain?  Yes    Pain Score  --   some this morning   Pain Location  Back    Pain Orientation  Right;Lower;Mid    Pain Descriptors / Indicators  Aching    Aggravating Factors   kettle bell swings, weed eating, jack hammering    Pain  Relieving Factors  standing quad stretching         [facility] PT Assessment - 11/19/18 0001      Assessment   Medical Diagnosis  low back pain    Referring Provider (PT)  [name], MD    Hand Dominance  Right      Precautions   Precautions  None      Restrictions   Weight Bearing Restrictions  No      Balance Screen   Has the patient fallen in the past 6 months  No      [place] residence    Living Arrangements  Alone    Additional Comments  stairs in home      Prior Function   Vocation  Retired    Biomedical scientist  care for farm      Cognition   Overall Cognitive Status  Within Functional  Limits for tasks assessed      Sensation   Additional Comments  numbness in bilateral toes      Posture/Postural Control   Posture Comments  PPT, decrease spinal curves; Lt pelvis & SIJ elevation compared to Rt; Rt post pelvic rotation      ROM / Strength   AROM / PROM / Strength  Strength      Strength   Overall Strength Comments  NT due to notable pelvic rotation      Palpation   Palpation comment  TTP Rt SIJ and surrounding musculature, decreased space bw Lt ASIS & ribs compared to Rt                Objective measurements completed on examination: See above findings.      [facility] Adult PT Treatment/Exercise - 11/19/18 0001      Manual Therapy   Manual Therapy  Muscle Energy Technique;Soft tissue mobilization;Joint mobilization    Joint Mobilization  Rt FA LAD gr 4    Soft tissue mobilization  Rt TFL & superior aspect of glut med    Muscle Energy Technique  Rt hip flexors/Lt extensors             PT Education - 11/19/18 1457    Education Details  anatomy of condition, POC, HEP, exercise form/rationale, heel lift    Person(s) Educated  Patient    Methods  Explanation;Verbal cues    Comprehension  Verbalized understanding;Need further instruction       PT Short Term Goals - 11/19/18 1455      PT SHORT TERM GOAL #1   Title  Pt will demo proper form with short term HEP as it has been established    Baseline  began at eval    Time  3    Period  Weeks    Status  New    Target Date  12/10/18      PT SHORT TERM GOAL #2   Title  Pt will demonstrate knowledge of proper postural alignment in seated and standing    Baseline  began educating at eval    Time  3    Period  Weeks    Status  New    Target Date  12/10/18        PT Long Term Goals - 11/19/18 1453      PT LONG TERM GOAL #1   Title  Pt will be able to squat with good form and without pain in Rt SIJ region    Baseline  avoiding at  this time    Time  6    Period  Weeks    Status  New     Target Date  01/02/19      PT LONG TERM GOAL #2   Title  Pt will be able to complete yard work/farm care activities without increase in concordant pain    Baseline  pain with weed eating, owns cows    Time  6    Period  Weeks    Status  New    Target Date  01/02/19      PT LONG TERM GOAL #3   Title  gross lumbopelvic strength 5/5    Baseline  NT at eval due to noted pelvic rotation    Time  6    Period  Weeks    Status  New    Target Date  01/02/19      PT LONG TERM GOAL #4   Title  pt will tolerate sitting for at least 45 min without increase in mid back pain    Baseline  frequent shifting/postural changes while sitting for eval today    Time  6    Period  Weeks    Status  New    Target Date  01/02/19      PT LONG TERM GOAL #5   Title  pt will be able to tolerate bike riding for exercise    Baseline  increased concordant LBP at eval    Time  6    Period  Weeks    Status  New    Target Date  01/02/19             Plan - 11/19/18 1437    Clinical Impression Statement  Pt presents to PT with complaints of chronic, on/off, pain in Rt lower back as well as mid thoracic pain that extends bilaterally. H/o bil TKA. Notable pelvic rotation corrected with MET but continued LLD (Rt shorter) noted. Decreased pain with trigger point released lateral to Rt SIJ and added 3 layer lift to Rt shoe. Pt reports he could feel a change in posture and it felt good. Asked him to wear the lift consistently for about 3 days but to remove it if pain increases. Pt will benefit from gross stability and strengthening in lumbopelvic and periscapular region.    Examination-Activity Limitations  Squat;Bed Mobility;Stairs;Lift;Bend;Stand;Carry;Sit;Sleep    Examination-Participation Restrictions  Yard Work;Driving;Cleaning    Stability/Clinical Decision Making  Stable/Uncomplicated    Clinical Decision Making  Low    Rehab Potential  Good    PT Frequency  2x / week    PT Duration  6 weeks    PT  Treatment/Interventions  ADLs/Self Care Home Management;Cryotherapy;Traction;Ultrasound;Moist Heat;Iontophoresis 49m/ml Dexamethasone;Electrical Stimulation;Gait training;Stair training;Functional mobility training;Therapeutic activities;Patient/family education;Neuromuscular re-education;Balance training;Therapeutic exercise;Manual techniques;Dry needling;Passive range of motion;Taping;Spinal Manipulations;Joint Manipulations    PT Next Visit Plan  outcome of heel lift? DN PRN, spinal mobs, postural strengthening, lumbopelvic stability    PT Home Exercise Plan  tennis ball TPR, heel lift    Consulted and Agree with Plan of Care  Patient       Patient will benefit from skilled therapeutic intervention in order to improve the following deficits and impairments:  Improper body mechanics, Pain, Postural dysfunction, Increased muscle spasms, Decreased mobility, Decreased activity tolerance, Decreased strength, Impaired flexibility  Visit Diagnosis: Chronic right-sided low back pain without sciatica - Plan: PT plan of care cert/re-cert  Pain in thoracic spine - Plan: PT plan of care cert/re-cert  Problem List Patient Active Problem List   Diagnosis Date Noted  . Acute upper respiratory infection 05/29/2018  . Allergic rhinitis 05/29/2018  . Anorexia 05/29/2018  . Artificial knee joint present 05/29/2018  . Asthma without status asthmaticus 05/29/2018  . Cough 05/29/2018  . Disorder of iron metabolism 05/29/2018  . Encounter for general adult medical examination without abnormal findings 05/29/2018  . Fatigue 05/29/2018  . History of colon polyps 05/29/2018  . Inguinal hernia without obstruction or gangrene 05/29/2018  . Insomnia due to medical condition 05/29/2018  . Malignant neoplasm of prostate ([place]) 05/29/2018  . Osteoarthrosis involving more than one site but not generalized 05/29/2018  . Other long term (current) drug therapy 05/29/2018  . Other specified extrapyramidal and  movement disorders 05/29/2018  . Pain in right knee 05/29/2018  . Pure hypercholesterolemia 05/29/2018  . Dextroscoliosis 08/01/2015  . Essential hypertension 06/30/2015  . Family history of heart disease 06/30/2015  . Left ventricular hypertrophy due to hypertensive disease 06/30/2015  . Left ventricular diastolic dysfunction, NYHA class 1 06/30/2015  . Fast heart beat 05/11/2015  . DJD (degenerative joint disease) of knee 04/27/2015    Jessica C. Hightower PT, DPT 11/19/18 4:14 PM   [place], [place], [address] Phone: [phone]   Fax:  [phone]  Name: Randall Bradley MRN: [id number] Date of Birth: [birth year]

## 2018-11-19 NOTE — Progress Notes (Signed)
Spoke with [facility] at Dr Inda Merlin' office to clarify orders and learned that pts phlebotomy was not supposed to be monthly but instead he should have labs drawn today.  Pt will be leaving [place] to go to their office for lab work.

## 2018-11-25 ENCOUNTER — Encounter: Payer: Self-pay | Admitting: Physical Therapy

## 2018-11-25 ENCOUNTER — Ambulatory Visit: Payer: BC Managed Care – PPO | Attending: Internal Medicine | Admitting: Physical Therapy

## 2018-11-25 ENCOUNTER — Other Ambulatory Visit: Payer: Self-pay

## 2018-11-25 DIAGNOSIS — M545 Low back pain: Secondary | ICD-10-CM | POA: Insufficient documentation

## 2018-11-25 DIAGNOSIS — G8929 Other chronic pain: Secondary | ICD-10-CM | POA: Insufficient documentation

## 2018-11-25 DIAGNOSIS — M546 Pain in thoracic spine: Secondary | ICD-10-CM | POA: Diagnosis not present

## 2018-11-25 NOTE — Therapy (Signed)
[facility] [place], [place], [address] Phone: [phone]   Fax:  [phone]  Physical Therapy Treatment  Patient Details  Name: Randall Bradley MRN: [id number] Date of Birth: 1956/04/28 Referring Provider (PT): [name], MD   Encounter Date: 11/25/2018  PT End of Session - 11/25/18 1214    Visit Number  2    Number of Visits  13    Date for PT Re-Evaluation  01/02/19    Authorization Type  BCBS    PT Start Time  1130    PT Stop Time  1212    PT Time Calculation (min)  42 min    Activity Tolerance  Patient tolerated treatment well    Behavior During Therapy  [facility] for tasks assessed/performed       Past Medical History:  Diagnosis Date  . Alopecia   . Arthritis    "knees"  . Hematuria   . Hemochromatosis   . Hereditary hemochromatosis ([place]) monitored by PCP  dr [name] gates   history of phlebotomies -- last one 2014 (pt states body has normalized)  . History of bladder stone   . Hypertension   . Kidney stones   . Prostate cancer ([place])    UROLOGIST-- DR Diona Fanti; S/P PROSTATECTOMY 03-17-2008  . Right ureteral stone   . Rosacea     Past Surgical History:  Procedure Laterality Date  . APPENDECTOMY  1963  . COLONOSCOPY    . COMPLEX WOUND CLOSURE Left 03-26-2009   INDEX FINGER COMPLEX WOUND LACERATION REPAIR  . CYSTOLITHOLAPAXY AND URETHRAL DILATION  10-05-2009  . CYSTOSCOPY W/ RETROGRADES Right 10/11/2014   Procedure: CYSTOSCOPY WITH RETROGRADE PYELOGRAM;  Surgeon: Franchot Gallo, MD;  Location: [facility];  Service: Urology;  Laterality: Right;  . HOLMIUM LASER APPLICATION N/A 2/42/6834   Procedure: HOLMIUM LASER LITHOTRIPSY, ;  Surgeon: Franchot Gallo, MD;  Location: [facility];  Service: Urology;  Laterality: N/A;  . JOINT REPLACEMENT    . KNEE ARTHROSCOPY Left 1995  . LIPOMA EXCISION  03-04-2000   forehead  . ROBOT ASSISTED LAPAROSCOPIC RADICAL  PROSTATECTOMY  03-17-2008   w/  BILATERAL PELVIC LYMPHADENECTOMY (NON-NERVE SPARING)  . TOTAL KNEE ARTHROPLASTY Left 04/27/2015   Procedure: TOTAL KNEE ARTHROPLASTY;  Surgeon: [name], MD;  Location: [place];  Service: Orthopedics;  Laterality: Left;  . TOTAL KNEE ARTHROPLASTY Right 06/08/2015  . TOTAL KNEE ARTHROPLASTY Right 06/08/2015   Procedure: TOTAL RIGHT KNEE ARTHROPLASTY;  Surgeon: [name], MD;  Location: [place];  Service: Orthopedics;  Laterality: Right;    There were no vitals filed for this visit.  Subjective Assessment - 11/25/18 1133    Subjective  Lift has alleviated part of the problem. Pain moved more proximally, neuropathy in Rt foot is almost gone.     Patient Stated Goals  decrease pain, weight lifting, stay physically fit, bike riding    Currently in Pain?  Yes    Pain Score  3     Pain Location  Back    Pain Orientation  Right;Lower    Pain Descriptors / Indicators  Aching                       [facility] Adult PT Treatment/Exercise - 11/25/18 0001      Exercises   Exercises  Knee/Hip      Knee/Hip Exercises: Stretches   Hip Flexor Stretch Limitations  edge of table  Other Knee/Hip Stretches  figure 4 stretch 30s      Knee/Hip Exercises: Standing   Other Standing Knee Exercises  shoulder flexion liftoff at wall + core/glut squeeze      Knee/Hip Exercises: Supine   Straight Leg Raises Limitations  paired with abdominal engagement      Knee/Hip Exercises: Sidelying   Hip ABduction  Both;20 reps;10 reps       Trigger Point Dry Needling - 11/25/18 0001    Consent Given?  Yes    Education Handout Provided  No    Muscles Treated Back/Hip  Gluteus medius   Rt   Gluteus Medius Response  Twitch response elicited;Palpable increased muscle length           PT Education - 11/25/18 1254    Education Details  TPDN & expected outcomes, anatomy of condiiton    Person(s) Educated  Patient    Methods   Explanation;Demonstration;Tactile cues;Verbal cues;Handout    Comprehension  Verbalized understanding;Need further instruction;Returned demonstration;Verbal cues required;Tactile cues required       PT Short Term Goals - 11/19/18 1455      PT SHORT TERM GOAL #1   Title  Pt will demo proper form with short term HEP as it has been established    Baseline  began at eval    Time  3    Period  Weeks    Status  New    Target Date  12/10/18      PT SHORT TERM GOAL #2   Title  Pt will demonstrate knowledge of proper postural alignment in seated and standing    Baseline  began educating at eval    Time  3    Period  Weeks    Status  New    Target Date  12/10/18        PT Long Term Goals - 11/19/18 1453      PT LONG TERM GOAL #1   Title  Pt will be able to squat with good form and without pain in Rt SIJ region    Baseline  avoiding at this time    Time  6    Period  Weeks    Status  New    Target Date  01/02/19      PT LONG TERM GOAL #2   Title  Pt will be able to complete yard work/farm care activities without increase in concordant pain    Baseline  pain with weed eating, owns cows    Time  6    Period  Weeks    Status  New    Target Date  01/02/19      PT LONG TERM GOAL #3   Title  gross lumbopelvic strength 5/5    Baseline  NT at eval due to noted pelvic rotation    Time  6    Period  Weeks    Status  New    Target Date  01/02/19      PT LONG TERM GOAL #4   Title  pt will tolerate sitting for at least 45 min without increase in mid back pain    Baseline  frequent shifting/postural changes while sitting for eval today    Time  6    Period  Weeks    Status  New    Target Date  01/02/19      PT LONG TERM GOAL #5   Title  pt will be able to tolerate bike riding for exercise  Baseline  increased concordant LBP at eval    Time  6    Period  Weeks    Status  New    Target Date  01/02/19            Plan - 11/25/18 1252    Clinical Impression Statement   Pt reported significant decrease in pain following DN today and was able to find appropriate muscular contractions in exercises. Significant rib cage flare with post pelvic tilt noted.     PT Treatment/Interventions  ADLs/Self Care Home Management;Cryotherapy;Traction;Ultrasound;Moist Heat;Iontophoresis 4mg /ml Dexamethasone;Electrical Stimulation;Gait training;Stair training;Functional mobility training;Therapeutic activities;Patient/family education;Neuromuscular re-education;Balance training;Therapeutic exercise;Manual techniques;Dry needling;Passive range of motion;Taping;Spinal Manipulations;Joint Manipulations    PT Next Visit Plan  DN PRN, qped UE exercises    PT Home Exercise Plan  tennis ball TPR, heel lift, marching+ppt, hip flexor stretch, figure4, liftoff at wall+core/glut set    Consulted and Agree with Plan of Care  Patient       Patient will benefit from skilled therapeutic intervention in order to improve the following deficits and impairments:  Improper body mechanics, Pain, Postural dysfunction, Increased muscle spasms, Decreased mobility, Decreased activity tolerance, Decreased strength, Impaired flexibility  Visit Diagnosis: Chronic right-sided low back pain without sciatica  Pain in thoracic spine     Problem List Patient Active Problem List   Diagnosis Date Noted  . Acute upper respiratory infection 05/29/2018  . Allergic rhinitis 05/29/2018  . Anorexia 05/29/2018  . Artificial knee joint present 05/29/2018  . Asthma without status asthmaticus 05/29/2018  . Cough 05/29/2018  . Disorder of iron metabolism 05/29/2018  . Encounter for general adult medical examination without abnormal findings 05/29/2018  . Fatigue 05/29/2018  . History of colon polyps 05/29/2018  . Inguinal hernia without obstruction or gangrene 05/29/2018  . Insomnia due to medical condition 05/29/2018  . Malignant neoplasm of prostate ([place]) 05/29/2018  . Osteoarthrosis involving more than one  site but not generalized 05/29/2018  . Other long term (current) drug therapy 05/29/2018  . Other specified extrapyramidal and movement disorders 05/29/2018  . Pain in right knee 05/29/2018  . Pure hypercholesterolemia 05/29/2018  . Dextroscoliosis 08/01/2015  . Essential hypertension 06/30/2015  . Family history of heart disease 06/30/2015  . Left ventricular hypertrophy due to hypertensive disease 06/30/2015  . Left ventricular diastolic dysfunction, NYHA class 1 06/30/2015  . Fast heart beat 05/11/2015  . DJD (degenerative joint disease) of knee 04/27/2015    Zoie Sarin C. Jayliah Benett PT, DPT 11/25/18 12:55 PM   [facility] [address] [place], [place], [address] Phone: [phone]   Fax:  [phone]  Name: CAMP GOPAL MRN: [id number] Date of Birth: [birth year]

## 2018-11-27 ENCOUNTER — Encounter: Payer: Self-pay | Admitting: Physical Therapy

## 2018-11-27 ENCOUNTER — Other Ambulatory Visit: Payer: Self-pay

## 2018-11-27 ENCOUNTER — Ambulatory Visit: Payer: BC Managed Care – PPO | Admitting: Physical Therapy

## 2018-11-27 DIAGNOSIS — G8929 Other chronic pain: Secondary | ICD-10-CM

## 2018-11-27 DIAGNOSIS — M546 Pain in thoracic spine: Secondary | ICD-10-CM | POA: Diagnosis not present

## 2018-11-27 DIAGNOSIS — M545 Low back pain: Secondary | ICD-10-CM | POA: Diagnosis not present

## 2018-11-27 NOTE — Therapy (Signed)
[facility] [place], [place], [address] Phone: [phone]   Fax:  [phone]  Physical Therapy Treatment  Patient Details  Name: Randall Bradley MRN: [id number] Date of Birth: Nov 10, 1955 Referring Provider (PT): [name], MD   Encounter Date: 11/27/2018  PT End of Session - 11/27/18 1429    Visit Number  3    Number of Visits  13    Date for PT Re-Evaluation  01/02/19    Authorization Type  BCBS    PT Start Time  1430    PT Stop Time  1517    PT Time Calculation (min)  47 min    Activity Tolerance  Patient tolerated treatment well    Behavior During Therapy  [facility] for tasks assessed/performed       Past Medical History:  Diagnosis Date  . Alopecia   . Arthritis    "knees"  . Hematuria   . Hemochromatosis   . Hereditary hemochromatosis ([place]) monitored by PCP  dr [name] gates   history of phlebotomies -- last one 2014 (pt states body has normalized)  . History of bladder stone   . Hypertension   . Kidney stones   . Prostate cancer ([place])    UROLOGIST-- DR Diona Fanti; S/P PROSTATECTOMY 03-17-2008  . Right ureteral stone   . Rosacea     Past Surgical History:  Procedure Laterality Date  . APPENDECTOMY  1963  . COLONOSCOPY    . COMPLEX WOUND CLOSURE Left 03-26-2009   INDEX FINGER COMPLEX WOUND LACERATION REPAIR  . CYSTOLITHOLAPAXY AND URETHRAL DILATION  10-05-2009  . CYSTOSCOPY W/ RETROGRADES Right 10/11/2014   Procedure: CYSTOSCOPY WITH RETROGRADE PYELOGRAM;  Surgeon: Franchot Gallo, MD;  Location: [facility];  Service: Urology;  Laterality: Right;  . HOLMIUM LASER APPLICATION N/A 1/32/4401   Procedure: HOLMIUM LASER LITHOTRIPSY, ;  Surgeon: Franchot Gallo, MD;  Location: [facility];  Service: Urology;  Laterality: N/A;  . JOINT REPLACEMENT    . KNEE ARTHROSCOPY Left 1995  . LIPOMA EXCISION  03-04-2000   forehead  . ROBOT ASSISTED LAPAROSCOPIC RADICAL  PROSTATECTOMY  03-17-2008   w/  BILATERAL PELVIC LYMPHADENECTOMY (NON-NERVE SPARING)  . TOTAL KNEE ARTHROPLASTY Left 04/27/2015   Procedure: TOTAL KNEE ARTHROPLASTY;  Surgeon: [name], MD;  Location: [facility];  Service: Orthopedics;  Laterality: Left;  . TOTAL KNEE ARTHROPLASTY Right 06/08/2015  . TOTAL KNEE ARTHROPLASTY Right 06/08/2015   Procedure: TOTAL RIGHT KNEE ARTHROPLASTY;  Surgeon: [name], MD;  Location: [facility];  Service: Orthopedics;  Laterality: Right;    There were no vitals filed for this visit.  Subjective Assessment - 11/27/18 1427    Subjective  Lower back is feeling good, the lower thoracic is what hurts.     Patient Stated Goals  decrease pain, weight lifting, stay physically fit, bike riding    Currently in Pain?  Yes    Pain Score  3     Pain Location  Back    Pain Orientation  Mid    Aggravating Factors   laying supine 6-7/10    Pain Relieving Factors  sitting up straight/posture                       [facility] Adult PT Treatment/Exercise - 11/27/18 0001      Exercises   Exercises  Shoulder      Knee/Hip Exercises: Aerobic   Other Aerobic  UBE retro 4 min  L1      Knee/Hip Exercises: Supine   Bridges with Ball Squeeze  15 reps   on soft foam roll   Straight Leg Raises Limitations  on foam roll- bent knee raises paired with extension, progressed to dead bug    Other Supine Knee/Hip Exercises  post pelvic tilt on foam roller    Other Supine Knee/Hip Exercises  hooklying ball squeeze + pelvic tilt on soft foam roll      Shoulder Exercises: Prone   Flexion Limitations  qped alt UE flexion    Other Prone Exercises  bird dog      Shoulder Exercises: Stretch   Other Shoulder Stretches  supine pec stretch on foam roller    Other Shoulder Stretches  child pose      Manual Therapy   Joint Mobilization  lower thoracic PA, gross rib mobs    Soft tissue mobilization  thoracic STM & IASTM               PT Short Term Goals -  11/19/18 1455      PT SHORT TERM GOAL #1   Title  Pt will demo proper form with short term HEP as it has been established    Baseline  began at eval    Time  3    Period  Weeks    Status  New    Target Date  12/10/18      PT SHORT TERM GOAL #2   Title  Pt will demonstrate knowledge of proper postural alignment in seated and standing    Baseline  began educating at eval    Time  3    Period  Weeks    Status  New    Target Date  12/10/18        PT Long Term Goals - 11/19/18 1453      PT LONG TERM GOAL #1   Title  Pt will be able to squat with good form and without pain in Rt SIJ region    Baseline  avoiding at this time    Time  6    Period  Weeks    Status  New    Target Date  01/02/19      PT LONG TERM GOAL #2   Title  Pt will be able to complete yard work/farm care activities without increase in concordant pain    Baseline  pain with weed eating, owns cows    Time  6    Period  Weeks    Status  New    Target Date  01/02/19      PT LONG TERM GOAL #3   Title  gross lumbopelvic strength 5/5    Baseline  NT at eval due to noted pelvic rotation    Time  6    Period  Weeks    Status  New    Target Date  01/02/19      PT LONG TERM GOAL #4   Title  pt will tolerate sitting for at least 45 min without increase in mid back pain    Baseline  frequent shifting/postural changes while sitting for eval today    Time  6    Period  Weeks    Status  New    Target Date  01/02/19      PT LONG TERM GOAL #5   Title  pt will be able to tolerate bike riding for exercise    Baseline  increased concordant  LBP at eval    Time  6    Period  Weeks    Status  New    Target Date  01/02/19            Plan - 11/27/18 1521    Clinical Impression Statement  Shift of pain with noted increase in lower thoracic spine as expected as lower back pain improves. Tends to sit with excessive upright posture creating tension along paraspinals. Reported feeling awkward in correct posture.  T9-12 with most limitation in spinal & rib mobility. improved rib cage depression with core contraction.     PT Treatment/Interventions  ADLs/Self Care Home Management;Cryotherapy;Traction;Ultrasound;Moist Heat;Iontophoresis 4mg /ml Dexamethasone;Electrical Stimulation;Gait training;Stair training;Functional mobility training;Therapeutic activities;Patient/family education;Neuromuscular re-education;Balance training;Therapeutic exercise;Manual techniques;Dry needling;Passive range of motion;Taping;Spinal Manipulations;Joint Manipulations    PT Next Visit Plan  DN thoracic paraspainals, continue foam roller    PT Home Exercise Plan  tennis ball TPR, heel lift, marching+ppt, hip flexor stretch, figure4, liftoff at wall+core/glut set; qped single arm raise, child pose, dead bug;     Consulted and Agree with Plan of Care  Patient       Patient will benefit from skilled therapeutic intervention in order to improve the following deficits and impairments:  Improper body mechanics, Pain, Postural dysfunction, Increased muscle spasms, Decreased mobility, Decreased activity tolerance, Decreased strength, Impaired flexibility  Visit Diagnosis: Chronic right-sided low back pain without sciatica  Pain in thoracic spine     Problem List Patient Active Problem List   Diagnosis Date Noted  . Acute upper respiratory infection 05/29/2018  . Allergic rhinitis 05/29/2018  . Anorexia 05/29/2018  . Artificial knee joint present 05/29/2018  . Asthma without status asthmaticus 05/29/2018  . Cough 05/29/2018  . Disorder of iron metabolism 05/29/2018  . Encounter for general adult medical examination without abnormal findings 05/29/2018  . Fatigue 05/29/2018  . History of colon polyps 05/29/2018  . Inguinal hernia without obstruction or gangrene 05/29/2018  . Insomnia due to medical condition 05/29/2018  . Malignant neoplasm of prostate ([place]) 05/29/2018  . Osteoarthrosis involving more than one site but not  generalized 05/29/2018  . Other long term (current) drug therapy 05/29/2018  . Other specified extrapyramidal and movement disorders 05/29/2018  . Pain in right knee 05/29/2018  . Pure hypercholesterolemia 05/29/2018  . Dextroscoliosis 08/01/2015  . Essential hypertension 06/30/2015  . Family history of heart disease 06/30/2015  . Left ventricular hypertrophy due to hypertensive disease 06/30/2015  . Left ventricular diastolic dysfunction, NYHA class 1 06/30/2015  . Fast heart beat 05/11/2015  . DJD (degenerative joint disease) of knee 04/27/2015   Shelda Truby C. Salina Stanfield PT, DPT 11/27/18 3:26 PM   [facility] [address] [place], [place], [address] Phone: [phone]   Fax:  [phone]  Name: Randall Bradley MRN: [id number] Date of Birth: 1955/09/29

## 2018-12-01 ENCOUNTER — Other Ambulatory Visit (HOSPITAL_COMMUNITY): Payer: Self-pay | Admitting: *Deleted

## 2018-12-02 ENCOUNTER — Other Ambulatory Visit: Payer: Self-pay

## 2018-12-02 ENCOUNTER — Encounter (HOSPITAL_COMMUNITY)
Admission: RE | Admit: 2018-12-02 | Discharge: 2018-12-02 | Disposition: A | Discharge status: Home / Self Care | Payer: BC Managed Care – PPO | Source: Ambulatory Visit | Attending: Internal Medicine | Admitting: Internal Medicine

## 2018-12-02 ENCOUNTER — Ambulatory Visit: Payer: BC Managed Care – PPO | Admitting: Physical Therapy

## 2018-12-02 ENCOUNTER — Encounter: Payer: Self-pay | Admitting: Physical Therapy

## 2018-12-02 DIAGNOSIS — M546 Pain in thoracic spine: Secondary | ICD-10-CM | POA: Diagnosis not present

## 2018-12-02 DIAGNOSIS — G8929 Other chronic pain: Secondary | ICD-10-CM | POA: Diagnosis not present

## 2018-12-02 DIAGNOSIS — M545 Low back pain, unspecified: Secondary | ICD-10-CM

## 2018-12-02 LAB — POCT HEMOGLOBIN-HEMACUE: Hemoglobin: 13.9 g/dL (ref 13.0–17.0)

## 2018-12-02 MED ORDER — LIDOCAINE HCL 2 % IJ SOLN: 0.1000 mL | Freq: Once | INTRAMUSCULAR | Status: DC

## 2018-12-02 NOTE — Therapy (Signed)
[place], [place], [address] Phone: [phone]   Fax:  [phone]  Physical Therapy Treatment  Patient Details  Name: Randall Bradley MRN: [id number] Date of Birth: 1955/12/30 Referring Provider (PT): [name], MD   Encounter Date: 12/02/2018  PT End of Session - 12/02/18 1112    Visit Number  4    Number of Visits  13    Date for PT Re-Evaluation  01/02/19    Authorization Type  BCBS    PT Start Time  1113    PT Stop Time  1156    PT Time Calculation (min)  43 min    Activity Tolerance  Patient tolerated treatment well    Behavior During Therapy  [facility] for tasks assessed/performed       Past Medical History:  Diagnosis Date  . Alopecia   . Arthritis    "knees"  . Hematuria   . Hemochromatosis   . Hereditary hemochromatosis ([place]) monitored by PCP  dr [name] gates   history of phlebotomies -- last one 2014 (pt states body has normalized)  . History of bladder stone   . Hypertension   . Kidney stones   . Prostate cancer ([place])    UROLOGIST-- DR Diona Fanti; S/P PROSTATECTOMY 03-17-2008  . Right ureteral stone   . Rosacea     Past Surgical History:  Procedure Laterality Date  . APPENDECTOMY  1963  . COLONOSCOPY    . COMPLEX WOUND CLOSURE Left 03-26-2009   INDEX FINGER COMPLEX WOUND LACERATION REPAIR  . CYSTOLITHOLAPAXY AND URETHRAL DILATION  10-05-2009  . CYSTOSCOPY W/ RETROGRADES Right 10/11/2014   Procedure: CYSTOSCOPY WITH RETROGRADE PYELOGRAM;  Surgeon: Franchot Gallo, MD;  Location: [facility];  Service: Urology;  Laterality: Right;  . HOLMIUM LASER APPLICATION N/A 12/19/348   Procedure: HOLMIUM LASER LITHOTRIPSY, ;  Surgeon: Franchot Gallo, MD;  Location: [facility];  Service: Urology;  Laterality: N/A;  . JOINT REPLACEMENT    . KNEE ARTHROSCOPY Left 1995  . LIPOMA EXCISION  03-04-2000   forehead  . ROBOT ASSISTED LAPAROSCOPIC RADICAL  PROSTATECTOMY  03-17-2008   w/  BILATERAL PELVIC LYMPHADENECTOMY (NON-NERVE SPARING)  . TOTAL KNEE ARTHROPLASTY Left 04/27/2015   Procedure: TOTAL KNEE ARTHROPLASTY;  Surgeon: [name], MD;  Location: [place];  Service: Orthopedics;  Laterality: Left;  . TOTAL KNEE ARTHROPLASTY Right 06/08/2015  . TOTAL KNEE ARTHROPLASTY Right 06/08/2015   Procedure: TOTAL RIGHT KNEE ARTHROPLASTY;  Surgeon: [name], MD;  Location: [facility];  Service: Orthopedics;  Laterality: Right;    There were no vitals filed for this visit.  Subjective Assessment - 12/02/18 1113    Subjective  Still having pain in thoracic region.     Patient Stated Goals  decrease pain, weight lifting, stay physically fit, bike riding    Currently in Pain?  Yes    Pain Score  5     Pain Location  Thoracic    Pain Orientation  Medial    Pain Descriptors / Indicators  Aching    Aggravating Factors   deep breathing    Pain Relieving Factors  sit up straight                       [facility] Adult PT Treatment/Exercise - 12/02/18 0001      Knee/Hip Exercises: Supine   Other Supine Knee/Hip Exercises  foam roller: balance with arm angles, clams, marches with tband  Other Supine Knee/Hip Exercises  review proper form with crunches/core strengthening      Shoulder Exercises: ROM/Strengthening   UBE (Upper Arm Bike)  retro 3 min L3      Manual Therapy   Joint Mobilization  thoracic & rib mobilization    Soft tissue mobilization  thoracic paraspinals, Lt subscap       Trigger Point Dry Needling - 12/02/18 0001    Muscles Treated Upper Quadrant  Middle trapezius;Subscapularis   thoracic paraspinals bil   Subscapularis Response  Twitch response elicited;Palpable increased muscle length   Lt   Middle trapezius Response  Twitch response elicited;Palpable increased muscle length    Gluteus Medius Response  Twitch response elicited;Palpable increased muscle length             PT Short Term Goals -  11/19/18 1455      PT SHORT TERM GOAL #1   Title  Pt will demo proper form with short term HEP as it has been established    Baseline  began at eval    Time  3    Period  Weeks    Status  New    Target Date  12/10/18      PT SHORT TERM GOAL #2   Title  Pt will demonstrate knowledge of proper postural alignment in seated and standing    Baseline  began educating at eval    Time  3    Period  Weeks    Status  New    Target Date  12/10/18        PT Long Term Goals - 11/19/18 1453      PT LONG TERM GOAL #1   Title  Pt will be able to squat with good form and without pain in Rt SIJ region    Baseline  avoiding at this time    Time  6    Period  Weeks    Status  New    Target Date  01/02/19      PT LONG TERM GOAL #2   Title  Pt will be able to complete yard work/farm care activities without increase in concordant pain    Baseline  pain with weed eating, owns cows    Time  6    Period  Weeks    Status  New    Target Date  01/02/19      PT LONG TERM GOAL #3   Title  gross lumbopelvic strength 5/5    Baseline  NT at eval due to noted pelvic rotation    Time  6    Period  Weeks    Status  New    Target Date  01/02/19      PT LONG TERM GOAL #4   Title  pt will tolerate sitting for at least 45 min without increase in mid back pain    Baseline  frequent shifting/postural changes while sitting for eval today    Time  6    Period  Weeks    Status  New    Target Date  01/02/19      PT LONG TERM GOAL #5   Title  pt will be able to tolerate bike riding for exercise    Baseline  increased concordant LBP at eval    Time  6    Period  Weeks    Status  New    Target Date  01/02/19  Plan - 12/02/18 1210    Clinical Impression Statement  Pt reported resolution of pain following treatment today and was able to lay in supine position. Was curling significantly with crunches and was corrected with cues. Subscap DN imprved posture as well as Lt GHJ pain.    PT  Treatment/Interventions  ADLs/Self Care Home Management;Cryotherapy;Traction;Ultrasound;Moist Heat;Iontophoresis 4mg /ml Dexamethasone;Electrical Stimulation;Gait training;Stair training;Functional mobility training;Therapeutic activities;Patient/family education;Neuromuscular re-education;Balance training;Therapeutic exercise;Manual techniques;Dry needling;Passive range of motion;Taping;Spinal Manipulations;Joint Manipulations    PT Next Visit Plan  DN PRN, consider refomer    PT Home Exercise Plan  tennis ball TPR, heel lift, marching+ppt, hip flexor stretch, figure4, liftoff at wall+core/glut set; qped single arm raise, child pose, dead bug; open book    Consulted and Agree with Plan of Care  Patient       Patient will benefit from skilled therapeutic intervention in order to improve the following deficits and impairments:  Improper body mechanics, Pain, Postural dysfunction, Increased muscle spasms, Decreased mobility, Decreased activity tolerance, Decreased strength, Impaired flexibility  Visit Diagnosis: Chronic right-sided low back pain without sciatica  Pain in thoracic spine     Problem List Patient Active Problem List   Diagnosis Date Noted  . Acute upper respiratory infection 05/29/2018  . Allergic rhinitis 05/29/2018  . Anorexia 05/29/2018  . Artificial knee joint present 05/29/2018  . Asthma without status asthmaticus 05/29/2018  . Cough 05/29/2018  . Disorder of iron metabolism 05/29/2018  . Encounter for general adult medical examination without abnormal findings 05/29/2018  . Fatigue 05/29/2018  . History of colon polyps 05/29/2018  . Inguinal hernia without obstruction or gangrene 05/29/2018  . Insomnia due to medical condition 05/29/2018  . Malignant neoplasm of prostate ([place]) 05/29/2018  . Osteoarthrosis involving more than one site but not generalized 05/29/2018  . Other long term (current) drug therapy 05/29/2018  . Other specified extrapyramidal and movement  disorders 05/29/2018  . Pain in right knee 05/29/2018  . Pure hypercholesterolemia 05/29/2018  . Dextroscoliosis 08/01/2015  . Essential hypertension 06/30/2015  . Family history of heart disease 06/30/2015  . Left ventricular hypertrophy due to hypertensive disease 06/30/2015  . Left ventricular diastolic dysfunction, NYHA class 1 06/30/2015  . Fast heart beat 05/11/2015  . DJD (degenerative joint disease) of knee 04/27/2015    Jamaira Sherk C. Gracieann Stannard PT, DPT 12/02/18 12:13 PM   [place], [place], [address] Phone: [phone]   Fax:  [phone]  Name: Randall Bradley MRN: [id number] Date of Birth: [birth year]

## 2018-12-03 ENCOUNTER — Encounter (HOSPITAL_COMMUNITY): Payer: BLUE CROSS/BLUE SHIELD

## 2018-12-04 ENCOUNTER — Encounter: Payer: Self-pay | Admitting: Physical Therapy

## 2018-12-04 ENCOUNTER — Other Ambulatory Visit: Payer: Self-pay

## 2018-12-04 ENCOUNTER — Ambulatory Visit: Payer: BC Managed Care – PPO | Admitting: Physical Therapy

## 2018-12-04 DIAGNOSIS — M546 Pain in thoracic spine: Secondary | ICD-10-CM | POA: Diagnosis not present

## 2018-12-04 DIAGNOSIS — M545 Low back pain, unspecified: Secondary | ICD-10-CM

## 2018-12-04 DIAGNOSIS — G8929 Other chronic pain: Secondary | ICD-10-CM | POA: Diagnosis not present

## 2018-12-04 NOTE — Therapy (Signed)
[place], [place], [address] Phone: [phone]   Fax:  [phone]  Physical Therapy Treatment  Patient Details  Name: Randall Bradley MRN: [id number] Date of Birth: 21-Jan-1956 Referring Provider (PT): [name], MD   Encounter Date: 12/04/2018  PT End of Session - 12/04/18 1045    Visit Number  5    Number of Visits  13    Date for PT Re-Evaluation  01/02/19    Authorization Type  BCBS    PT Start Time  6962    PT Stop Time  1118   pt stated "that's enough"   PT Time Calculation (min)  33 min    Activity Tolerance  Patient tolerated treatment well    Behavior During Therapy  [facility] for tasks assessed/performed       Past Medical History:  Diagnosis Date  . Alopecia   . Arthritis    "knees"  . Hematuria   . Hemochromatosis   . Hereditary hemochromatosis ([place]) monitored by PCP  dr [name] gates   history of phlebotomies -- last one 2014 (pt states body has normalized)  . History of bladder stone   . Hypertension   . Kidney stones   . Prostate cancer ([place])    UROLOGIST-- DR Diona Fanti; S/P PROSTATECTOMY 03-17-2008  . Right ureteral stone   . Rosacea     Past Surgical History:  Procedure Laterality Date  . APPENDECTOMY  1963  . COLONOSCOPY    . COMPLEX WOUND CLOSURE Left 03-26-2009   INDEX FINGER COMPLEX WOUND LACERATION REPAIR  . CYSTOLITHOLAPAXY AND URETHRAL DILATION  10-05-2009  . CYSTOSCOPY W/ RETROGRADES Right 10/11/2014   Procedure: CYSTOSCOPY WITH RETROGRADE PYELOGRAM;  Surgeon: Franchot Gallo, MD;  Location: [facility];  Service: Urology;  Laterality: Right;  . HOLMIUM LASER APPLICATION N/A 9/52/8413   Procedure: HOLMIUM LASER LITHOTRIPSY, ;  Surgeon: Franchot Gallo, MD;  Location[facility];  Service: Urology;  Laterality: N/A;  . JOINT REPLACEMENT    . KNEE ARTHROSCOPY Left 1995  . LIPOMA EXCISION  03-04-2000   forehead  . ROBOT ASSISTED  LAPAROSCOPIC RADICAL PROSTATECTOMY  03-17-2008   w/  BILATERAL PELVIC LYMPHADENECTOMY (NON-NERVE SPARING)  . TOTAL KNEE ARTHROPLASTY Left 04/27/2015   Procedure: TOTAL KNEE ARTHROPLASTY;  Surgeon: [name], MD;  Location: [place];  Service: Orthopedics;  Laterality: Left;  . TOTAL KNEE ARTHROPLASTY Right 06/08/2015  . TOTAL KNEE ARTHROPLASTY Right 06/08/2015   Procedure: TOTAL RIGHT KNEE ARTHROPLASTY;  Surgeon: [name], MD;  Location: [place];  Service: Orthopedics;  Laterality: Right;    There were no vitals filed for this visit.  Subjective Assessment - 12/04/18 1046    Subjective  Not a problem after last visit. Still feel the area of my mid back when I lay on a hard floor.    Currently in Pain?  No/denies                       [facility] Adult PT Treatment/Exercise - 12/04/18 0001      Pilates   Pilates Reformer  see PT note       Pilates Reformer used for LE/core strength, postural strength, lumbopelvic disassociation and core control.  Exercises included: Footwork- 2 red 1 blue Bridging- 1 red 1 blue Feet in Straps-  1 red 1 blue 1 yellow; turnout table top press, straight leg press down parallel and turnout, circles Long box Prone- triceps  press, straight arm press neutral & abd Seated Arms Short box 2red 1 blue 1 yellow; qped turnout press    Figure 4 stretch Hamstring stretch with feet in straps      PT Short Term Goals - 11/19/18 1455      PT SHORT TERM GOAL #1   Title  Pt will demo proper form with short term HEP as it has been established    Baseline  began at eval    Time  3    Period  Weeks    Status  New    Target Date  12/10/18      PT SHORT TERM GOAL #2   Title  Pt will demonstrate knowledge of proper postural alignment in seated and standing    Baseline  began educating at eval    Time  3    Period  Weeks    Status  New    Target Date  12/10/18        PT Long Term Goals - 11/19/18 1453      PT LONG TERM GOAL #1    Title  Pt will be able to squat with good form and without pain in Rt SIJ region    Baseline  avoiding at this time    Time  6    Period  Weeks    Status  New    Target Date  01/02/19      PT LONG TERM GOAL #2   Title  Pt will be able to complete yard work/farm care activities without increase in concordant pain    Baseline  pain with weed eating, owns cows    Time  6    Period  Weeks    Status  New    Target Date  01/02/19      PT LONG TERM GOAL #3   Title  gross lumbopelvic strength 5/5    Baseline  NT at eval due to noted pelvic rotation    Time  6    Period  Weeks    Status  New    Target Date  01/02/19      PT LONG TERM GOAL #4   Title  pt will tolerate sitting for at least 45 min without increase in mid back pain    Baseline  frequent shifting/postural changes while sitting for eval today    Time  6    Period  Weeks    Status  New    Target Date  01/02/19      PT LONG TERM GOAL #5   Title  pt will be able to tolerate bike riding for exercise    Baseline  increased concordant LBP at eval    Time  6    Period  Weeks    Status  New    Target Date  01/02/19            Plan - 12/04/18 1118    Clinical Impression Statement  Utilized reformer today for increased challenge in strengthening. He will try stretches at home until next appointment and we will determine further treatment of thoracic spine to address the "lump" that he feels when laying supine.    PT Treatment/Interventions  ADLs/Self Care Home Management;Cryotherapy;Traction;Ultrasound;Moist Heat;Iontophoresis 4mg /ml Dexamethasone;Electrical Stimulation;Gait training;Stair training;Functional mobility training;Therapeutic activities;Patient/family education;Neuromuscular re-education;Balance training;Therapeutic exercise;Manual techniques;Dry needling;Passive range of motion;Taping;Spinal Manipulations;Joint Manipulations    PT Next Visit Plan  reformer exercises to mat, feeling of thoracic spine in supine  PT Home Exercise Plan  tennis ball TPR, heel lift, marching+ppt, hip flexor stretch, figure4, liftoff at wall+core/glut set; qped single arm raise, child pose, dead bug; open book    Consulted and Agree with Plan of Care  Patient       Patient will benefit from skilled therapeutic intervention in order to improve the following deficits and impairments:  Improper body mechanics, Pain, Postural dysfunction, Increased muscle spasms, Decreased mobility, Decreased activity tolerance, Decreased strength, Impaired flexibility  Visit Diagnosis: Chronic right-sided low back pain without sciatica   Pain in thoracic spine     Problem List Patient Active Problem List   Diagnosis Date Noted  . Acute upper respiratory infection 05/29/2018  . Allergic rhinitis 05/29/2018  . Anorexia 05/29/2018  . Artificial knee joint present 05/29/2018  . Asthma without status asthmaticus 05/29/2018  . Cough 05/29/2018  . Disorder of iron metabolism 05/29/2018  . Encounter for general adult medical examination without abnormal findings 05/29/2018  . Fatigue 05/29/2018  . History of colon polyps 05/29/2018  . Inguinal hernia without obstruction or gangrene 05/29/2018  . Insomnia due to medical condition 05/29/2018  . Malignant neoplasm of prostate ([place]) 05/29/2018  . Osteoarthrosis involving more than one site but not generalized 05/29/2018  . Other long term (current) drug therapy 05/29/2018  . Other specified extrapyramidal and movement disorders 05/29/2018  . Pain in right knee 05/29/2018  . Pure hypercholesterolemia 05/29/2018  . Dextroscoliosis 08/01/2015  . Essential hypertension 06/30/2015  . Family history of heart disease 06/30/2015  . Left ventricular hypertrophy due to hypertensive disease 06/30/2015  . Left ventricular diastolic dysfunction, NYHA class 1 06/30/2015  . Fast heart beat 05/11/2015  . DJD (degenerative joint disease) of knee 04/27/2015    Randall Bradley C. Aleza Pew PT,  DPT 12/04/18 11:35 AM   [place] [facility] [address] [place], [place], [address] Phone: [phone]   Fax:  [phone]  Name: Randall Bradley MRN: [id number] Date of Birth: Apr 05, 1956

## 2018-12-09 ENCOUNTER — Other Ambulatory Visit: Payer: Self-pay

## 2018-12-09 ENCOUNTER — Encounter: Payer: Self-pay | Admitting: Physical Therapy

## 2018-12-09 ENCOUNTER — Ambulatory Visit: Payer: BC Managed Care – PPO | Admitting: Physical Therapy

## 2018-12-09 DIAGNOSIS — M545 Low back pain, unspecified: Secondary | ICD-10-CM

## 2018-12-09 DIAGNOSIS — G8929 Other chronic pain: Secondary | ICD-10-CM

## 2018-12-09 DIAGNOSIS — M546 Pain in thoracic spine: Secondary | ICD-10-CM | POA: Diagnosis not present

## 2018-12-09 NOTE — Therapy (Signed)
[name], [place], [address] Phone: [phone]   Fax:  [phone]  Physical Therapy Treatment  Patient Details  Name: Randall Bradley MRN: [id number] Date of Birth: 08-15-1955 Referring Provider (PT): [name], MD   Encounter Date: 12/09/2018  PT End of Session - 12/09/18 1122    Visit Number  6    Number of Visits  13    Date for PT Re-Evaluation  01/02/19    Authorization Type  BCBS    PT Start Time  1104    PT Stop Time  1155    PT Time Calculation (min)  51 min    Activity Tolerance  Patient tolerated treatment well    Behavior During Therapy  [facility] for tasks assessed/performed       Past Medical History:  Diagnosis Date  . Alopecia   . Arthritis    "knees"  . Hematuria   . Hemochromatosis   . Hereditary hemochromatosis ([place]) monitored by PCP  dr [name] gates   history of phlebotomies -- last one 2014 (pt states body has normalized)  . History of bladder stone   . Hypertension   . Kidney stones   . Prostate cancer ([place])    UROLOGIST-- DR Diona Fanti; S/P PROSTATECTOMY 03-17-2008  . Right ureteral stone   . Rosacea     Past Surgical History:  Procedure Laterality Date  . APPENDECTOMY  1963  . COLONOSCOPY    . COMPLEX WOUND CLOSURE Left 03-26-2009   INDEX FINGER COMPLEX WOUND LACERATION REPAIR  . CYSTOLITHOLAPAXY AND URETHRAL DILATION  10-05-2009  . CYSTOSCOPY W/ RETROGRADES Right 10/11/2014   Procedure: CYSTOSCOPY WITH RETROGRADE PYELOGRAM;  Surgeon: Franchot Gallo, MD;  [facility];  Service: Urology;  Laterality: Right;  . HOLMIUM LASER APPLICATION N/A 6/81/1572   Procedure: HOLMIUM LASER LITHOTRIPSY, ;  Surgeon: Franchot Gallo, MD;  Location: [facility];  Service: Urology;  Laterality: N/A;  . JOINT REPLACEMENT    . KNEE ARTHROSCOPY Left 1995  . LIPOMA EXCISION  03-04-2000   forehead  . ROBOT ASSISTED LAPAROSCOPIC RADICAL  PROSTATECTOMY  03-17-2008   w/  BILATERAL PELVIC LYMPHADENECTOMY (NON-NERVE SPARING)  . TOTAL KNEE ARTHROPLASTY Left 04/27/2015   Procedure: TOTAL KNEE ARTHROPLASTY;  Surgeon: [name], MD;  Location: [place];  Service: Orthopedics;  Laterality: Left;  . TOTAL KNEE ARTHROPLASTY Right 06/08/2015  . TOTAL KNEE ARTHROPLASTY Right 06/08/2015   Procedure: TOTAL RIGHT KNEE ARTHROPLASTY;  Surgeon: [name], MD;  Location: [place];  Service: Orthopedics;  Laterality: Right;    There were no vitals filed for this visit.  Subjective Assessment - 12/09/18 1105    Subjective  I have a pain in Rt lumbar, residual pain , pretty constant.    Currently in Pain?  Yes    Pain Location  Back    Pain Orientation  Lower;Right    Pain Descriptors / Indicators  Dull    Pain Type  Chronic pain    Pain Onset  More than a month ago    Pain Frequency  Intermittent              [facility] Adult PT Treatment/Exercise - 12/09/18 0001      Self-Care   Self-Care  Posture;Other Self-Care Comments    Posture  PIlates, neutral, core     Other Self-Care Comments   shoulder exercises      Pilates   Pilates Reformer  see PT note  Knee/Hip Exercises: Aerobic   Nustep  5 min L5 UE and LE         Pilates Reformer used for LE/core strength, postural strength, lumbopelvic disassociation and core control.  Exercises included:   Footwork 2 Red 1 blue parallel on heels and forefoot, turnout.  For stabilization of pelvis.    Bridging 2 Red 1 blue x 10, excellent awareness and articulation.   Feet in Straps 1 Red 1 yellow  Arcs ion parallel and turn out, squats x 10   Long box Prone overhead 1 Red bilateral and unilateral  Mod cues for L shoulder   Standing press out with and without rotation 1 Red using long box x 3    ER blue band at wall x 15    horiz pull blue band at wall x 10         PT Short Term Goals - 11/19/18 1455      PT SHORT TERM GOAL #1   Title  Pt will demo proper  form with short term HEP as it has been established    Baseline  began at eval    Time  3    Period  Weeks    Status  New    Target Date  12/10/18      PT SHORT TERM GOAL #2   Title  Pt will demonstrate knowledge of proper postural alignment in seated and standing    Baseline  began educating at eval    Time  3    Period  Weeks    Status  New    Target Date  12/10/18        PT Long Term Goals - 11/19/18 1453      PT LONG TERM GOAL #1   Title  Pt will be able to squat with good form and without pain in Rt SIJ region    Baseline  avoiding at this time    Time  6    Period  Weeks    Status  New    Target Date  01/02/19      PT LONG TERM GOAL #2   Title  Pt will be able to complete yard work/farm care activities without increase in concordant pain    Baseline  pain with weed eating, owns cows    Time  6    Period  Weeks    Status  New    Target Date  01/02/19      PT LONG TERM GOAL #3   Title  gross lumbopelvic strength 5/5    Baseline  NT at eval due to noted pelvic rotation    Time  6    Period  Weeks    Status  New    Target Date  01/02/19      PT LONG TERM GOAL #4   Title  pt will tolerate sitting for at least 45 min without increase in mid back pain    Baseline  frequent shifting/postural changes while sitting for eval today    Time  6    Period  Weeks    Status  New    Target Date  01/02/19      PT LONG TERM GOAL #5   Title  pt will be able to tolerate bike riding for exercise    Baseline  increased concordant LBP at eval    Time  6    Period  Weeks    Status  New  Target Date  01/02/19            Plan - 12/09/18 1226    Clinical Impression Statement  PT no longer feels a "lump" in middle back.  L shoulder blade protracted, weaker than Rt. Gave band exercises to address this and improve posterior cuff strength.   He did exceptionally well on the Reformer and reported no increased pain post session.    PT Treatment/Interventions  ADLs/Self Care  Home Management;Cryotherapy;Traction;Ultrasound;Moist Heat;Iontophoresis 4mg /ml Dexamethasone;Electrical Stimulation;Gait training;Stair training;Functional mobility training;Therapeutic activities;Patient/family education;Neuromuscular re-education;Balance training;Therapeutic exercise;Manual techniques;Dry needling;Passive range of motion;Taping;Spinal Manipulations;Joint Manipulations    PT Next Visit Plan  cont reformer exercises to mat, intregrate UE into core    PT Home Exercise Plan  tennis ball TPR, heel lift, marching+ppt, hip flexor stretch, figure4, liftoff at wall+core/glut set; qped single arm raise, child pose, dead bug; open book    Consulted and Agree with Plan of Care  Patient       Patient will benefit from skilled therapeutic intervention in order to improve the following deficits and impairments:  Improper body mechanics, Pain, Postural dysfunction, Increased muscle spasms, Decreased mobility, Decreased activity tolerance, Decreased strength, Impaired flexibility  Visit Diagnosis: 1. Chronic right-sided low back pain without sciatica   2. Pain in thoracic spine        Problem List Patient Active Problem List   Diagnosis Date Noted  . Acute upper respiratory infection 05/29/2018  . Allergic rhinitis 05/29/2018  . Anorexia 05/29/2018  . Artificial knee joint present 05/29/2018  . Asthma without status asthmaticus 05/29/2018  . Cough 05/29/2018  . Disorder of iron metabolism 05/29/2018  . Encounter for general adult medical examination without abnormal findings 05/29/2018  . Fatigue 05/29/2018  . History of colon polyps 05/29/2018  . Inguinal hernia without obstruction or gangrene 05/29/2018  . Insomnia due to medical condition 05/29/2018  . Malignant neoplasm of prostate ([place]) 05/29/2018  . Osteoarthrosis involving more than one site but not generalized 05/29/2018  . Other long term (current) drug therapy 05/29/2018  . Other specified extrapyramidal and movement  disorders 05/29/2018  . Pain in right knee 05/29/2018  . Pure hypercholesterolemia 05/29/2018  . Dextroscoliosis 08/01/2015  . Essential hypertension 06/30/2015  . Family history of heart disease 06/30/2015  . Left ventricular hypertrophy due to hypertensive disease 06/30/2015  . Left ventricular diastolic dysfunction, NYHA class 1 06/30/2015  . Fast heart beat 05/11/2015  . DJD (degenerative joint disease) of knee 04/27/2015    Maikol Grassia 12/09/2018, 1:01 PM  [facility] [address] [place], [place], [address] Phone: [phone]   Fax:  [phone]  Name: STEPHONE GUM MRN: [id number] Date of Birth: Apr 28, 1956  Raeford Razor, PT 12/09/18 1:31 PM Phone: [phone] Fax: [phone]

## 2018-12-11 ENCOUNTER — Ambulatory Visit: Payer: BC Managed Care – PPO | Admitting: Physical Therapy

## 2018-12-11 ENCOUNTER — Encounter: Payer: Self-pay | Admitting: Physical Therapy

## 2018-12-11 ENCOUNTER — Other Ambulatory Visit: Payer: Self-pay

## 2018-12-11 DIAGNOSIS — M546 Pain in thoracic spine: Secondary | ICD-10-CM | POA: Diagnosis not present

## 2018-12-11 DIAGNOSIS — G8929 Other chronic pain: Secondary | ICD-10-CM

## 2018-12-11 DIAGNOSIS — M545 Low back pain: Secondary | ICD-10-CM | POA: Diagnosis not present

## 2018-12-11 NOTE — Therapy (Signed)
[place], [place], [address] Phone: [phone]   Fax:  [phone]  Physical Therapy Treatment  Patient Details  Name: Randall Bradley MRN: [id number] Date of Birth: Feb 20, 1956 Referring Provider (PT): [name], MD   Encounter Date: 12/11/2018  PT End of Session - 12/11/18 1100    Visit Number  7    Number of Visits  13    Date for PT Re-Evaluation  01/02/19    Authorization Type  BC[employer]    PT Start Time  1100    PT Stop Time  1140    PT Time Calculation (min)  40 min    Activity Tolerance  Patient tolerated treatment well    Behavior During Therapy  [facility] for tasks assessed/performed       Past Medical History:  Diagnosis Date  . Alopecia   . Arthritis    "knees"  . Hematuria   . Hemochromatosis   . Hereditary hemochromatosis ([place]) monitored by PCP  dr [name] gates   history of phlebotomies -- last one 2014 (pt states body has normalized)  . History of bladder stone   . Hypertension   . Kidney stones   . Prostate cancer ([place])    UROLOGIST-- DR Diona Fanti; S/P PROSTATECTOMY 03-17-2008  . Right ureteral stone   . Rosacea     Past Surgical History:  Procedure Laterality Date  . APPENDECTOMY  1963  . COLONOSCOPY    . COMPLEX WOUND CLOSURE Left 03-26-2009   INDEX FINGER COMPLEX WOUND LACERATION REPAIR  . CYSTOLITHOLAPAXY AND URETHRAL DILATION  10-05-2009  . CYSTOSCOPY W/ RETROGRADES Right 10/11/2014   Procedure: CYSTOSCOPY WITH RETROGRADE PYELOGRAM;  Surgeon: Franchot Gallo, MD;  Location: [facility];  Service: Urology;  Laterality: Right;  . HOLMIUM LASER APPLICATION N/A 6/72/0947   Procedure: HOLMIUM LASER LITHOTRIPSY, ;  Surgeon: Franchot Gallo, MD;  Location: [facility];  Service: Urology;  Laterality: N/A;  . JOINT REPLACEMENT    . KNEE ARTHROSCOPY Left 1995  . LIPOMA EXCISION  03-04-2000   forehead  . ROBOT ASSISTED LAPAROSCOPIC RADICAL  PROSTATECTOMY  03-17-2008   w/  BILATERAL PELVIC LYMPHADENECTOMY (NON-NERVE SPARING)  . TOTAL KNEE ARTHROPLASTY Left 04/27/2015   Procedure: TOTAL KNEE ARTHROPLASTY;  Surgeon: [name], MD;  Location: [place];  Service: Orthopedics;  Laterality: Left;  . TOTAL KNEE ARTHROPLASTY Right 06/08/2015  . TOTAL KNEE ARTHROPLASTY Right 06/08/2015   Procedure: TOTAL RIGHT KNEE ARTHROPLASTY;  Surgeon: [name], MD;  Location: [place];  Service: Orthopedics;  Laterality: Right;    There were no vitals filed for this visit.  Subjective Assessment - 12/11/18 1100    Subjective  Beginning to feel more of the low back pain on the bone and some pain across mid back is more noticable today    Currently in Pain?  Yes    Pain Score  --   noticable   Pain Location  Back    Pain Orientation  Mid;Lower                       [facility] Adult PT Treatment/Exercise - 12/11/18 0001      Knee/Hip Exercises: Stretches   Passive Hamstring Stretch Limitations  seated EOB & supine with strap    Other Knee/Hip Stretches  hip IR stretch in supine- figure 4 & with trunk rotation      Knee/Hip Exercises: Supine   Other Supine Knee/Hip Exercises  dead bug ext green tband    Other Supine Knee/Hip Exercises  TT to press green tbands feet to hands      Manual Therapy   Manual therapy comments  manual IR stretch Rt hip in prone with pressure to piriformis & sacrum    Soft tissue mobilization  Rt piriformis               PT Short Term Goals - 11/19/18 1455      PT SHORT TERM GOAL #1   Title  Pt will demo proper form with short term HEP as it has been established    Baseline  began at eval    Time  3    Period  Weeks    Status  New    Target Date  12/10/18      PT SHORT TERM GOAL #2   Title  Pt will demonstrate knowledge of proper postural alignment in seated and standing    Baseline  began educating at eval    Time  3    Period  Weeks    Status  New    Target Date  12/10/18         PT Long Term Goals - 11/19/18 1453      PT LONG TERM GOAL #1   Title  Pt will be able to squat with good form and without pain in Rt SIJ region    Baseline  avoiding at this time    Time  6    Period  Weeks    Status  New    Target Date  01/02/19      PT LONG TERM GOAL #2   Title  Pt will be able to complete yard work/farm care activities without increase in concordant pain    Baseline  pain with weed eating, owns cows    Time  6    Period  Weeks    Status  New    Target Date  01/02/19      PT LONG TERM GOAL #3   Title  gross lumbopelvic strength 5/5    Baseline  NT at eval due to noted pelvic rotation    Time  6    Period  Weeks    Status  New    Target Date  01/02/19      PT LONG TERM GOAL #4   Title  pt will tolerate sitting for at least 45 min without increase in mid back pain    Baseline  frequent shifting/postural changes while sitting for eval today    Time  6    Period  Weeks    Status  New    Target Date  01/02/19      PT LONG TERM GOAL #5   Title  pt will be able to tolerate bike riding for exercise    Baseline  increased concordant LBP at eval    Time  6    Period  Weeks    Status  New    Target Date  01/02/19            Plan - 12/11/18 1154    Clinical Impression Statement  trigger point in medial half of piriformis recreated concordant pain and was decreased with STM & IR stretching. Used bands for core + UE work in supine, similar to  reformer exercises.    PT Treatment/Interventions  ADLs/Self Care Home Management;Cryotherapy;Traction;Ultrasound;Moist Heat;Iontophoresis 4mg /ml Dexamethasone;Electrical Stimulation;Gait training;Stair training;Functional mobility training;Therapeutic activities;Patient/family education;Neuromuscular re-education;Balance training;Therapeutic  exercise;Manual techniques;Dry needling;Passive range of motion;Taping;Spinal Manipulations;Joint Manipulations    PT Home Exercise Plan  tennis ball TPR, heel lift,  marching+ppt, hip flexor stretch, figure4, liftoff at wall+core/glut set; qped single arm raise, child pose, dead bug; open book; supine HSS; dead bug & TT press with green bands    Consulted and Agree with Plan of Care  Patient       Patient will benefit from skilled therapeutic intervention in order to improve the following deficits and impairments:  Improper body mechanics, Pain, Postural dysfunction, Increased muscle spasms, Decreased mobility, Decreased activity tolerance, Decreased strength, Impaired flexibility  Visit Diagnosis: 1. Chronic right-sided low back pain without sciatica   2. Pain in thoracic spine        Problem List Patient Active Problem List   Diagnosis Date Noted  . Acute upper respiratory infection 05/29/2018  . Allergic rhinitis 05/29/2018  . Anorexia 05/29/2018  . Artificial knee joint present 05/29/2018  . Asthma without status asthmaticus 05/29/2018  . Cough 05/29/2018  . Disorder of iron metabolism 05/29/2018  . Encounter for general adult medical examination without abnormal findings 05/29/2018  . Fatigue 05/29/2018  . History of colon polyps 05/29/2018  . Inguinal hernia without obstruction or gangrene 05/29/2018  . Insomnia due to medical condition 05/29/2018  . Malignant neoplasm of prostate ([place]) 05/29/2018  . Osteoarthrosis involving more than one site but not generalized 05/29/2018  . Other long term (current) drug therapy 05/29/2018  . Other specified extrapyramidal and movement disorders 05/29/2018  . Pain in right knee 05/29/2018  . Pure hypercholesterolemia 05/29/2018  . Dextroscoliosis 08/01/2015  . Essential hypertension 06/30/2015  . Family history of heart disease 06/30/2015  . Left ventricular hypertrophy due to hypertensive disease 06/30/2015  . Left ventricular diastolic dysfunction, NYHA class 1 06/30/2015  . Fast heart beat 05/11/2015  . DJD (degenerative joint disease) of knee 04/27/2015    Paula Zietz C. Jaeden Messer PT,  DPT 12/11/18 12:01 PM   [facility] [address] [place], [place], [address] Phone: [phone]   Fax:  [phone]  Name: Randall Bradley MRN: [id number] Date of Birth: [birth year]

## 2018-12-15 DIAGNOSIS — E785 Hyperlipidemia, unspecified: Secondary | ICD-10-CM | POA: Diagnosis not present

## 2018-12-15 DIAGNOSIS — R7989 Other specified abnormal findings of blood chemistry: Secondary | ICD-10-CM | POA: Diagnosis not present

## 2018-12-15 DIAGNOSIS — Z8619 Personal history of other infectious and parasitic diseases: Secondary | ICD-10-CM | POA: Diagnosis not present

## 2018-12-15 DIAGNOSIS — Z23 Encounter for immunization: Secondary | ICD-10-CM | POA: Diagnosis not present

## 2018-12-15 DIAGNOSIS — Z8546 Personal history of malignant neoplasm of prostate: Secondary | ICD-10-CM | POA: Diagnosis not present

## 2018-12-15 DIAGNOSIS — Z0000 Encounter for general adult medical examination without abnormal findings: Secondary | ICD-10-CM | POA: Diagnosis not present

## 2018-12-15 DIAGNOSIS — Z79899 Other long term (current) drug therapy: Secondary | ICD-10-CM | POA: Diagnosis not present

## 2018-12-15 DIAGNOSIS — I10 Essential (primary) hypertension: Secondary | ICD-10-CM | POA: Diagnosis not present

## 2018-12-16 ENCOUNTER — Ambulatory Visit: Payer: BC Managed Care – PPO | Admitting: Physical Therapy

## 2018-12-17 ENCOUNTER — Encounter (HOSPITAL_COMMUNITY): Payer: BLUE CROSS/BLUE SHIELD

## 2018-12-18 ENCOUNTER — Ambulatory Visit: Payer: BC Managed Care – PPO | Admitting: Physical Therapy

## 2018-12-18 ENCOUNTER — Other Ambulatory Visit: Payer: Self-pay

## 2018-12-18 ENCOUNTER — Encounter: Payer: Self-pay | Admitting: Physical Therapy

## 2018-12-18 DIAGNOSIS — M545 Low back pain: Secondary | ICD-10-CM | POA: Diagnosis not present

## 2018-12-18 DIAGNOSIS — M546 Pain in thoracic spine: Secondary | ICD-10-CM

## 2018-12-18 DIAGNOSIS — G8929 Other chronic pain: Secondary | ICD-10-CM | POA: Diagnosis not present

## 2018-12-18 NOTE — Therapy (Addendum)
[place], [place], [address] Phone: [phone]   Fax:  [phone]  Physical Therapy Treatment/Discharge  Patient Details  Name: Randall Bradley MRN: [id number] Date of Birth: 05/26/1956 Referring Provider (PT): [name], MD   Encounter Date: 12/18/2018  PT End of Session - 12/18/18 1143    Visit Number  8    Number of Visits  13    Date for PT Re-Evaluation  01/02/19    Authorization Type  BCBS    PT Start Time  1049    PT Stop Time  1144    PT Time Calculation (min)  55 min    Activity Tolerance  Patient tolerated treatment well    Behavior During Therapy  [facility] for tasks assessed/performed       Past Medical History:  Diagnosis Date  . Alopecia   . Arthritis    "knees"  . Hematuria   . Hemochromatosis   . Hereditary hemochromatosis ([place]) monitored by PCP  dr [name] gates   history of phlebotomies -- last one 2014 (pt states body has normalized)  . History of bladder stone   . Hypertension   . Kidney stones   . Prostate cancer ([place])    UROLOGIST-- DR Diona Fanti; S/P PROSTATECTOMY 03-17-2008  . Right ureteral stone   . Rosacea     Past Surgical History:  Procedure Laterality Date  . APPENDECTOMY  1963  . COLONOSCOPY    . COMPLEX WOUND CLOSURE Left 03-26-2009   INDEX FINGER COMPLEX WOUND LACERATION REPAIR  . CYSTOLITHOLAPAXY AND URETHRAL DILATION  10-05-2009  . CYSTOSCOPY W/ RETROGRADES Right 10/11/2014   Procedure: CYSTOSCOPY WITH RETROGRADE PYELOGRAM;  Surgeon: Franchot Gallo, MD;  Location: [facility];  Service: Urology;  Laterality: Right;  . HOLMIUM LASER APPLICATION N/A 5/83/0940   Procedure: HOLMIUM LASER LITHOTRIPSY, ;  Surgeon: Franchot Gallo, MD;  Location: [facility];  Service: Urology;  Laterality: N/A;  . JOINT REPLACEMENT    . KNEE ARTHROSCOPY Left 1995  . LIPOMA EXCISION  03-04-2000   forehead  . ROBOT ASSISTED LAPAROSCOPIC  RADICAL PROSTATECTOMY  03-17-2008   w/  BILATERAL PELVIC LYMPHADENECTOMY (NON-NERVE SPARING)  . TOTAL KNEE ARTHROPLASTY Left 04/27/2015   Procedure: TOTAL KNEE ARTHROPLASTY;  Surgeon: [name], MD;  Location: [place];  Service: Orthopedics;  Laterality: Left;  . TOTAL KNEE ARTHROPLASTY Right 06/08/2015  . TOTAL KNEE ARTHROPLASTY Right 06/08/2015   Procedure: TOTAL RIGHT KNEE ARTHROPLASTY;  Surgeon: [name], MD;  Location: [place];  Service: Orthopedics;  Laterality: Right;    There were no vitals filed for this visit.  Subjective Assessment - 12/18/18 1051    Subjective  having more upper back pain than usual.    Patient Stated Goals  decrease pain, weight lifting, stay physically fit, bike riding    Currently in Pain?  Yes    Pain Score  4     Pain Location  Back    Pain Orientation  Mid         [facility] PT Assessment - 12/18/18 0001      Posture/Postural Control   Posture Comments  Lt shoulder rounded forward      Strength   Strength Assessment Site  Shoulder    Right/Left Shoulder  Left    Left Shoulder External Rotation  4/5    Left Shoulder Horizontal ABduction  5/5   impingment with testing  [place] Adult PT Treatment/Exercise - 12/18/18 0001      Knee/Hip Exercises: Supine   Other Supine Knee/Hip Exercises  roller- deadbug extension, opp arm/leg reach      Knee/Hip Exercises: Prone   Other Prone Exercises  primal push up      Shoulder Exercises: Supine   Diagonals Limitations  red tband while on foam roller      Shoulder Exercises: Stretch   Other Shoulder Stretches  supine chest stretch on roller      Manual Therapy   Manual therapy comments  skilled palpation and monitoring during TPDN    Joint Mobilization  T10 lateral & rotation , scapular mobs       Trigger Point Dry Needling - 12/18/18 0001    Muscles Treated Head and Neck  Upper trapezius    Other Dry Needling  thoracic paraspinals T10    Upper Trapezius Response   Twitch reponse elicited;Palpable increased muscle length   left   Subscapularis Response  Twitch response elicited;Palpable increased muscle length   Left          PT Education - 12/18/18 1203    Education Details  shoulder measurements & involvement in biomechanical chain    Person(s) Educated  Patient    Methods  Explanation    Comprehension  Verbalized understanding       PT Short Term Goals - 11/19/18 1455      PT SHORT TERM GOAL #1   Title  Pt will demo proper form with short term HEP as it has been established    Baseline  began at eval    Time  3    Period  Weeks    Status  New    Target Date  12/10/18      PT SHORT TERM GOAL #2   Title  Pt will demonstrate knowledge of proper postural alignment in seated and standing    Baseline  began educating at eval    Time  3    Period  Weeks    Status  New    Target Date  12/10/18        PT Long Term Goals - 11/19/18 1453      PT LONG TERM GOAL #1   Title  Pt will be able to squat with good form and without pain in Rt SIJ region    Baseline  avoiding at this time    Time  6    Period  Weeks    Status  New    Target Date  01/02/19      PT LONG TERM GOAL #2   Title  Pt will be able to complete yard work/farm care activities without increase in concordant pain    Baseline  pain with weed eating, owns cows    Time  6    Period  Weeks    Status  New    Target Date  01/02/19      PT LONG TERM GOAL #3   Title  gross lumbopelvic strength 5/5    Baseline  NT at eval due to noted pelvic rotation    Time  6    Period  Weeks    Status  New    Target Date  01/02/19      PT LONG TERM GOAL #4   Title  pt will tolerate sitting for at least 45 min without increase in mid back pain    Baseline  frequent shifting/postural changes while sitting for  eval today    Time  6    Period  Weeks    Status  New    Target Date  01/02/19      PT LONG TERM GOAL #5   Title  pt will be able to tolerate bike riding for exercise     Baseline  increased concordant LBP at eval    Time  6    Period  Weeks    Status  New    Target Date  01/02/19            Plan - 12/18/18 1127    Clinical Impression Statement  postural habits have been created from lower back to Left shoulder resulting in impingment at posture becomes more upright through lumbar and thoracic spine. DN utilized today to improve scapular mobility and shoulder motion to continue along biomechanical chain. returned to foam roller exercises to encourage full chain extension.    PT Treatment/Interventions  ADLs/Self Care Home Management;Cryotherapy;Traction;Ultrasound;Moist Heat;Iontophoresis 39m/ml Dexamethasone;Electrical Stimulation;Gait training;Stair training;Functional mobility training;Therapeutic activities;Patient/family education;Neuromuscular re-education;Balance training;Therapeutic exercise;Manual techniques;Dry needling;Passive range of motion;Taping;Spinal Manipulations;Joint Manipulations       Patient will benefit from skilled therapeutic intervention in order to improve the following deficits and impairments:  Improper body mechanics, Pain, Postural dysfunction, Increased muscle spasms, Decreased mobility, Decreased activity tolerance, Decreased strength, Impaired flexibility  Visit Diagnosis: 1. Chronic right-sided low back pain without sciatica   2. Pain in thoracic spine        Problem List Patient Active Problem List   Diagnosis Date Noted  . Acute upper respiratory infection 05/29/2018  . Allergic rhinitis 05/29/2018  . Anorexia 05/29/2018  . Artificial knee joint present 05/29/2018  . Asthma without status asthmaticus 05/29/2018  . Cough 05/29/2018  . Disorder of iron metabolism 05/29/2018  . Encounter for general adult medical examination without abnormal findings 05/29/2018  . Fatigue 05/29/2018  . History of colon polyps 05/29/2018  . Inguinal hernia without obstruction or gangrene 05/29/2018  . Insomnia due to  medical condition 05/29/2018  . Malignant [place] of [place] 05/29/2018  . Osteoarthrosis involving more than one site but not generalized 05/29/2018  . Other long term (current) drug therapy 05/29/2018  . Other specified extrapyramidal and movement disorders 05/29/2018  . Pain in right knee 05/29/2018  . Pure hypercholesterolemia 05/29/2018  . Dextroscoliosis 08/01/2015  . Essential hypertension 06/30/2015  . Family history of heart disease 06/30/2015  . Left ventricular hypertrophy due to hypertensive disease 06/30/2015  . Left ventricular diastolic dysfunction, NYHA class 1 06/30/2015  . Fast heart beat 05/11/2015  . DJD (degenerative joint disease) of knee 04/27/2015   Rinaldo Macqueen C. Ariele Vidrio PT, DPT 12/18/18 12:04 PM   [facility]GPostville [place] [address]Phone: [phone]  Fax:  [phone] Name: Randall PETRICHMRN: [id number] of Birth: 303/14/1957 PHYSICAL THERAPY DISCHARGE SUMMARY  Visits from Start of Care: 8  Current functional level related to goals / functional outcomes: See above   Remaining deficits: See above   Education / Equipment: Anatomy of condition, POC, HEP, exercise form/rationale  Plan: Patient agrees to discharge.  Patient goals were not met. Patient is being discharged due to not returning since the last visit.  ?????     Estelita Iten C. Jacqlyn Marolf PT, DPT 02/19/19 4:21 PM

## 2018-12-19 ENCOUNTER — Encounter (HOSPITAL_COMMUNITY): Payer: PRIVATE HEALTH INSURANCE

## 2018-12-24 ENCOUNTER — Encounter

## 2018-12-24 ENCOUNTER — Ambulatory Visit: Payer: BLUE CROSS/BLUE SHIELD | Admitting: Cardiovascular Disease

## 2018-12-29 DIAGNOSIS — C61 Malignant neoplasm of prostate: Secondary | ICD-10-CM | POA: Diagnosis not present

## 2018-12-29 DIAGNOSIS — N5231 Erectile dysfunction following radical prostatectomy: Secondary | ICD-10-CM | POA: Diagnosis not present

## 2018-12-29 DIAGNOSIS — N3943 Post-void dribbling: Secondary | ICD-10-CM | POA: Diagnosis not present

## 2019-02-05 DIAGNOSIS — H04123 Dry eye syndrome of bilateral lacrimal glands: Secondary | ICD-10-CM | POA: Diagnosis not present

## 2019-02-05 DIAGNOSIS — H2513 Age-related nuclear cataract, bilateral: Secondary | ICD-10-CM | POA: Diagnosis not present

## 2019-02-05 DIAGNOSIS — H524 Presbyopia: Secondary | ICD-10-CM | POA: Diagnosis not present

## 2019-02-27 ENCOUNTER — Other Ambulatory Visit (HOSPITAL_COMMUNITY): Payer: Self-pay

## 2019-03-03 ENCOUNTER — Inpatient Hospital Stay (HOSPITAL_COMMUNITY)
Admission: RE | Admit: 2019-03-03 | Discharge: 2019-03-03 | Disposition: A | Discharge status: Home / Self Care | Payer: PRIVATE HEALTH INSURANCE | Source: Ambulatory Visit | Attending: Internal Medicine | Admitting: Internal Medicine

## 2019-03-16 ENCOUNTER — Encounter (HOSPITAL_COMMUNITY): Payer: PRIVATE HEALTH INSURANCE

## 2019-03-18 DIAGNOSIS — Z23 Encounter for immunization: Secondary | ICD-10-CM | POA: Diagnosis not present

## 2019-04-28 DIAGNOSIS — L578 Other skin changes due to chronic exposure to nonionizing radiation: Secondary | ICD-10-CM | POA: Diagnosis not present

## 2019-05-26 ENCOUNTER — Other Ambulatory Visit: Payer: Self-pay

## 2019-05-26 ENCOUNTER — Encounter: Payer: Self-pay | Admitting: Physical Therapy

## 2019-05-26 ENCOUNTER — Ambulatory Visit: Payer: BC Managed Care – PPO | Attending: Internal Medicine | Admitting: Physical Therapy

## 2019-05-26 DIAGNOSIS — M25511 Pain in right shoulder: Secondary | ICD-10-CM | POA: Diagnosis not present

## 2019-05-26 DIAGNOSIS — M25512 Pain in left shoulder: Secondary | ICD-10-CM | POA: Diagnosis present

## 2019-05-26 DIAGNOSIS — R293 Abnormal posture: Secondary | ICD-10-CM

## 2019-05-26 DIAGNOSIS — M545 Low back pain: Secondary | ICD-10-CM | POA: Diagnosis present

## 2019-05-26 DIAGNOSIS — G8929 Other chronic pain: Secondary | ICD-10-CM | POA: Insufficient documentation

## 2019-05-26 NOTE — Therapy (Addendum)
[place], [place], [address] Phone: [phone]   Fax:  [phone]  Physical Therapy Evaluation  Patient Details  Name: Randall Bradley MRN: [id number] Date of Birth: 1955/12/17 Referring Provider (PT): [name], MD   Encounter Date: 05/26/2019  PT End of Session - 05/26/19 0850    Visit Number  1    Number of Visits  5    Date for PT Re-Evaluation  06/26/19    Authorization Type  BCBS    PT Start Time  0845    PT Stop Time  0917    PT Time Calculation (min)  32 min    Activity Tolerance  Patient tolerated treatment well    Behavior During Therapy  [facility] for tasks assessed/performed       Past Medical History:  Diagnosis Date  . Alopecia   . Arthritis    "knees"  . Hematuria   . Hemochromatosis   . Hereditary hemochromatosis ([place]) monitored by PCP  dr [name] gates   history of phlebotomies -- last one 2014 (pt states body has normalized)  . History of bladder stone   . Hypertension   . Kidney stones   . Prostate cancer ([place])    UROLOGIST-- DR Diona Fanti; S/P PROSTATECTOMY 03-17-2008  . Right ureteral stone   . Rosacea     Past Surgical History:  Procedure Laterality Date  . APPENDECTOMY  1963  . COLONOSCOPY    . COMPLEX WOUND CLOSURE Left 03-26-2009   INDEX FINGER COMPLEX WOUND LACERATION REPAIR  . CYSTOLITHOLAPAXY AND URETHRAL DILATION  10-05-2009  . CYSTOSCOPY W/ RETROGRADES Right 10/11/2014   Procedure: CYSTOSCOPY WITH RETROGRADE PYELOGRAM;  Surgeon: Franchot Gallo, MD;  Location[facility];  Service: Urology;  Laterality: Right;  . HOLMIUM LASER APPLICATION N/A 0000000   Procedure: HOLMIUM LASER LITHOTRIPSY, ;  Surgeon: Franchot Gallo, MD;  Location: [facility];  Service: Urology;  Laterality: N/A;  . JOINT REPLACEMENT    . KNEE ARTHROSCOPY Left 1995  . LIPOMA EXCISION  03-04-2000   forehead  . ROBOT ASSISTED LAPAROSCOPIC RADICAL  PROSTATECTOMY  03-17-2008   w/  BILATERAL PELVIC LYMPHADENECTOMY (NON-NERVE SPARING)  . TOTAL KNEE ARTHROPLASTY Left 04/27/2015   Procedure: TOTAL KNEE ARTHROPLASTY;  Surgeon: [name], MD;  Location: [place];  Service: Orthopedics;  Laterality: Left;  . TOTAL KNEE ARTHROPLASTY Right 06/08/2015  . TOTAL KNEE ARTHROPLASTY Right 06/08/2015   Procedure: TOTAL RIGHT KNEE ARTHROPLASTY;  Surgeon: [name], MD;  Location: [place];  Service: Orthopedics;  Laterality: Right;    There were no vitals filed for this visit.   Subjective Assessment - 05/26/19 0846    Subjective  Just certain things I do, I can feel it's odd. Points to pec and anterior deltoid when raising arm. I find that doing push ups makes it feel better after but hurts when I do them. I hear a grinding sound in shoulder. 6-8 weeks ago I was lifting and pulling heavy planters.    Patient Stated Goals  sleep on Rt side.    Currently in Pain?  No/denies    Aggravating Factors   certain motions    Pain Relieving Factors  after push ups, fine at rest         [facility] PT Assessment - 05/26/19 0001      Assessment   Medical Diagnosis  bil shoulder pain    Referring Provider (PT)  [name], MD  Onset Date/Surgical Date  --   Rt 6-8 weeks ago   Hand Dominance  Right      Precautions   Precautions  None      Restrictions   Weight Bearing Restrictions  No      Balance Screen   Has the patient fallen in the past 6 months  No      [place] residence      Prior Function   Level of [place]  Retired      Associate Professor   Overall Cognitive Status  Within Functional Limits for tasks assessed      Sensation   Additional Comments  [facility]      Posture/Postural Control   Posture Comments  rounded shoulders      ROM / Strength   AROM / PROM / Strength  AROM;Strength      AROM   Overall AROM Comments  GHJ bil WFL with impingement      Strength    Overall Strength Comments  GHJ gross 4/5                Objective measurements completed on examination: See above findings.      [place] Adult PT Treatment/Exercise - 05/26/19 0001      Exercises   Exercises  Shoulder      Shoulder Exercises: Supine   Other Supine Exercises  hooklying scap retraction      Shoulder Exercises: Standing   Row  15 reps    Theraband Level (Shoulder Row)  Other (comment)   black tband   Other Standing Exercises  lawn mower- upright, red tband      Shoulder Exercises: Stretch   Other Shoulder Stretches  pec: door, behind back, hooklying             PT Education - 05/26/19 0926    Education Details  anatomy of condition POC, HEP, exercise form/rationale    Person(s) Educated  Patient    Methods  Explanation;Demonstration;Tactile cues;Verbal cues;Handout    Comprehension  Verbalized understanding;Returned demonstration;Verbal cues required;Tactile cues required;Need further instruction          PT Long Term Goals - 05/26/19 0923      PT LONG TERM GOAL #1   Title  GHJ ER & flx strength to 5/5    Baseline  4/5 at eval    Time  4    Period  Weeks    Status  New    Target Date  06/26/19      PT LONG TERM GOAL #2   Title  Pt will perform full GHJ AROM without impingement    Baseline  bil pain at eval    Time  4    Period  Weeks    Status  New    Target Date  06/26/19      PT LONG TERM GOAL #3   Title  Pt will demo proper resting posture    Baseline  rounded shoulders at eval    Time  4    Period  Weeks    Status  New    Target Date  06/26/19      PT LONG TERM GOAL #4   Title  pt will be able to lift, reach and pull without limitation by shoulder pain    Baseline  limited at eval    Time  4    Period  Weeks  Status  New    Target Date  06/26/19      PT LONG TERM GOAL #5   Title  .             Plan - 05/26/19 0919    Clinical Impression Statement  Pt presents to PT with complaints of subacute, bil  shoulder pain. S/s consistent with impingement due to tight anterior musculature promoting forward rounded shoulders. Postural deviations creating poor activation of periscapular/thoracic musculature. Pt will benefit from skilled PT in order to improve postural alignment and muscular activation to decrease impingement.    Examination-Activity Limitations  Bathing;Bed Mobility;Reach Overhead;Carry;Sleep;Dressing;Lift    Examination-Participation Restrictions  Cleaning;Meal Prep;Yard Work    Stability/Clinical Decision Making  Stable/Uncomplicated    Clinical Decision Making  Low    Rehab Potential  Good    PT Frequency  1x / week    PT Duration  4 weeks    PT Treatment/Interventions  ADLs/Self Care Home Management;Cryotherapy;Electrical Stimulation;Iontophoresis 4mg /ml Dexamethasone;Moist Heat;Traction;Ultrasound;Therapeutic exercise;Therapeutic activities;Functional mobility training;Neuromuscular re-education;Patient/family education;Dry needling;Passive range of motion;Manual techniques;Taping;Spinal Manipulations;Joint Manipulations    PT Next Visit Plan  DN pecs & upper traps, open book    PT Home Exercise Plan  RLHHDCPZ  pec stretch- door, hooklying, behind back; row, lawn mower    Consulted and Agree with Plan of Care  Patient       Patient will benefit from skilled therapeutic intervention in order to improve the following deficits and impairments:  Increased muscle spasms, Decreased activity tolerance, Pain, Improper body mechanics, Impaired flexibility, Decreased strength, Postural dysfunction  Visit Diagnosis: Right shoulder pain, unspecified chronicity - Plan: PT plan of care cert/re-cert  Left shoulder pain, unspecified chronicity - Plan: PT plan of care cert/re-cert  Abnormal posture - Plan: PT plan of care cert/re-cert     Problem List Patient Active Problem List   Diagnosis Date Noted  . Acute upper respiratory infection 05/29/2018  . Allergic rhinitis 05/29/2018  .  Anorexia 05/29/2018  . Artificial knee joint present 05/29/2018  . Asthma without status asthmaticus 05/29/2018  . Cough 05/29/2018  . Disorder of iron metabolism 05/29/2018  . Encounter for general adult medical examination without abnormal findings 05/29/2018  . Fatigue 05/29/2018  . History of colon polyps 05/29/2018  . Inguinal hernia without obstruction or gangrene 05/29/2018  . Insomnia due to medical condition 05/29/2018  . Malignant neoplasm of prostate ([place]) 05/29/2018  . Osteoarthrosis involving more than one site but not generalized 05/29/2018  . Other long term (current) drug therapy 05/29/2018  . Other specified extrapyramidal and movement disorders 05/29/2018  . Pain in right knee 05/29/2018  . Pure hypercholesterolemia 05/29/2018  . Dextroscoliosis 08/01/2015  . Essential hypertension 06/30/2015  . Family history of heart disease 06/30/2015  . Left ventricular hypertrophy due to hypertensive disease 06/30/2015  . Left ventricular diastolic dysfunction, NYHA class 1 06/30/2015  . Fast heart beat 05/11/2015  . DJD (degenerative joint disease) of knee 04/27/2015    Rebel Willcutt C. Eldridge Marcott PT, DPT 05/26/19 9:33 AM   [facility] [address] [place], [place], [address] Phone: [phone]   Fax:  [phone]  Name: Randall Bradley MRN: [id number] Date of Birth: July 14, 1955

## 2019-06-02 ENCOUNTER — Encounter: Payer: Self-pay | Admitting: Physical Therapy

## 2019-06-02 ENCOUNTER — Encounter (HOSPITAL_COMMUNITY): Payer: BC Managed Care – PPO

## 2019-06-02 ENCOUNTER — Other Ambulatory Visit: Payer: Self-pay

## 2019-06-02 ENCOUNTER — Ambulatory Visit: Payer: BC Managed Care – PPO | Admitting: Physical Therapy

## 2019-06-02 DIAGNOSIS — M25512 Pain in left shoulder: Secondary | ICD-10-CM

## 2019-06-02 DIAGNOSIS — M25511 Pain in right shoulder: Secondary | ICD-10-CM | POA: Diagnosis not present

## 2019-06-02 DIAGNOSIS — R293 Abnormal posture: Secondary | ICD-10-CM

## 2019-06-02 NOTE — Therapy (Signed)
[place], [place], [address] Phone: [phone]   Fax:  [phone]  Physical Therapy Treatment  Patient Details  Name: Randall Bradley MRN: [id number] Date of Birth: Jun 23, 1956 Referring Provider (PT): [name], MD   Encounter Date: 06/02/2019  PT End of Session - 06/02/19 0849    Visit Number  2    Number of Visits  5    Date for PT Re-Evaluation  06/26/19    Authorization Type  BCBS    PT Start Time  [phone]    PT Stop Time  0926    PT Time Calculation (min)  40 min    Activity Tolerance  Patient tolerated treatment well    Behavior During Therapy  [facility] for tasks assessed/performed       Past Medical History:  Diagnosis Date  . Alopecia   . Arthritis    "knees"  . Hematuria   . Hemochromatosis   . Hereditary hemochromatosis ([place]) monitored by PCP  dr [name] gates   history of phlebotomies -- last one 2014 (pt states body has normalized)  . History of bladder stone   . Hypertension   . Kidney stones   . Prostate cancer ([place])    UROLOGIST-- DR Diona Fanti; S/P PROSTATECTOMY 03-17-2008  . Right ureteral stone   . Rosacea     Past Surgical History:  Procedure Laterality Date  . APPENDECTOMY  1963  . COLONOSCOPY    . COMPLEX WOUND CLOSURE Left 03-26-2009   INDEX FINGER COMPLEX WOUND LACERATION REPAIR  . CYSTOLITHOLAPAXY AND URETHRAL DILATION  10-05-2009  . CYSTOSCOPY W/ RETROGRADES Right 10/11/2014   Procedure: CYSTOSCOPY WITH RETROGRADE PYELOGRAM;  Surgeon: Franchot Gallo, MD;  Location: [facility];  Service: Urology;  Laterality: Right;  . HOLMIUM LASER APPLICATION N/A 0000000   Procedure: HOLMIUM LASER LITHOTRIPSY, ;  Surgeon: Franchot Gallo, MD;  Location: [facility];  Service: Urology;  Laterality: N/A;  . JOINT REPLACEMENT    . KNEE ARTHROSCOPY Left 1995  . LIPOMA EXCISION  03-04-2000   forehead  . ROBOT ASSISTED LAPAROSCOPIC RADICAL  PROSTATECTOMY  03-17-2008   w/  BILATERAL PELVIC LYMPHADENECTOMY (NON-NERVE SPARING)  . TOTAL KNEE ARTHROPLASTY Left 04/27/2015   Procedure: TOTAL KNEE ARTHROPLASTY;  Surgeon: [name], MD;  Location: [place];  Service: Orthopedics;  Laterality: Left;  . TOTAL KNEE ARTHROPLASTY Right 06/08/2015  . TOTAL KNEE ARTHROPLASTY Right 06/08/2015   Procedure: TOTAL RIGHT KNEE ARTHROPLASTY;  Surgeon: [name], MD;  Location: [place];  Service: Orthopedics;  Laterality: Right;    There were no vitals filed for this visit.  Subjective Assessment - 06/02/19 0849    Subjective  Not any pain until I do something. Aware of posture    Currently in Pain?  No/denies                       [facility] Adult PT Treatment/Exercise - 06/02/19 0001      Shoulder Exercises: Stretch   Other Shoulder Stretches  open book    Other Shoulder Stretches  supine GHJ ER, upper trap bilateral      Manual Therapy   Manual Therapy  Soft tissue mobilization;Joint mobilization    Manual therapy comments  skilled palpation and monitoring during TPDN    Joint Mobilization  GHJ AP    Soft tissue mobilization  bil pec major, minor, deltoid STM & stretching       Trigger  Point Dry Needling - 06/02/19 0001    Consent Given?  Yes    Education Handout Provided  Previously provided    Muscles Treated Upper Quadrant  Pectoralis major;Deltoid    Pectoralis Major Response  Twitch response elicited;Palpable increased muscle length   bilateral   Deltoid Response  Twitch response elicited;Palpable increased muscle length   bilateral anterior          PT Education - 06/02/19 0926    Education Details  posture & utilization of new flexibility, TPDN & expected outcomes    Person(s) Educated  Patient    Methods  Explanation    Comprehension  Verbalized understanding;Need further instruction          PT Long Term Goals - 05/26/19 0923      PT LONG TERM GOAL #1   Title  GHJ ER & flx strength to 5/5     Baseline  4/5 at eval    Time  4    Period  Weeks    Status  New    Target Date  06/26/19      PT LONG TERM GOAL #2   Title  Pt will perform full GHJ AROM without impingement    Baseline  bil pain at eval    Time  4    Period  Weeks    Status  New    Target Date  06/26/19      PT LONG TERM GOAL #3   Title  Pt will demo proper resting posture    Baseline  rounded shoulders at eval    Time  4    Period  Weeks    Status  New    Target Date  06/26/19      PT LONG TERM GOAL #4   Title  pt will be able to lift, reach and pull without limitation by shoulder pain    Baseline  limited at eval    Time  4    Period  Weeks    Status  New    Target Date  06/26/19      PT LONG TERM GOAL #5   Title  .            Plan - 06/02/19 0931    Clinical Impression Statement  Able to perform GHJ IR and horiz abd without impingement symptoms following DN & stretching pecs and anterior deltoid. Increased scap winging noted with upright posture which we discussed will need to be addressed. Is currently renovating a house and we discussed importance of balancing muscle work and equal/opposite motions.    PT Treatment/Interventions  ADLs/Self Care Home Management;Cryotherapy;Electrical Stimulation;Iontophoresis 4mg /ml Dexamethasone;Moist Heat;Traction;Ultrasound;Therapeutic exercise;Therapeutic activities;Functional mobility training;Neuromuscular re-education;Patient/family education;Dry needling;Passive range of motion;Manual techniques;Taping;Spinal Manipulations;Joint Manipulations    PT Next Visit Plan  DN PRN, periscap strengthening    PT Home Exercise Plan  RLHHDCPZ  pec stretch- door, hooklying, behind back; row, lawn mower, open book    Consulted and Agree with Plan of Care  Patient       Patient will benefit from skilled therapeutic intervention in order to improve the following deficits and impairments:  Increased muscle spasms, Decreased activity tolerance, Pain, Improper body  mechanics, Impaired flexibility, Decreased strength, Postural dysfunction  Visit Diagnosis: Right shoulder pain, unspecified chronicity  Left shoulder pain, unspecified chronicity  Abnormal posture     Problem List Patient Active Problem List   Diagnosis Date Noted  . Acute upper respiratory infection 05/29/2018  . Allergic rhinitis 05/29/2018  .  Anorexia 05/29/2018  . Artificial knee joint present 05/29/2018  . Asthma without status asthmaticus 05/29/2018  . Cough 05/29/2018  . Disorder of iron metabolism 05/29/2018  . Encounter for general adult medical examination without abnormal findings 05/29/2018  . Fatigue 05/29/2018  . History of colon polyps 05/29/2018  . Inguinal hernia without obstruction or gangrene 05/29/2018  . Insomnia due to medical condition 05/29/2018  . Malignant neoplasm of prostate ([place]) 05/29/2018  . Osteoarthrosis involving more than one site but not generalized 05/29/2018  . Other long term (current) drug therapy 05/29/2018  . Other specified extrapyramidal and movement disorders 05/29/2018  . Pain in right knee 05/29/2018  . Pure hypercholesterolemia 05/29/2018  . Dextroscoliosis 08/01/2015  . Essential hypertension 06/30/2015  . Family history of heart disease 06/30/2015  . Left ventricular hypertrophy due to hypertensive disease 06/30/2015  . Left ventricular diastolic dysfunction, NYHA class 1 06/30/2015  . Fast heart beat 05/11/2015  . DJD (degenerative joint disease) of knee 04/27/2015    Myana Schlup C. Madasyn Heath PT, DPT 06/02/19 9:33 AM   [facility] [address] [place], [place], [address] Phone: [phone]   Fax:  [phone]  Name: Randall Bradley MRN: [id number] Date of Birth: [birth year]

## 2019-06-09 ENCOUNTER — Encounter: Payer: Self-pay | Admitting: Physical Therapy

## 2019-06-09 ENCOUNTER — Ambulatory Visit: Payer: BC Managed Care – PPO | Admitting: Physical Therapy

## 2019-06-09 ENCOUNTER — Other Ambulatory Visit: Payer: Self-pay

## 2019-06-09 DIAGNOSIS — R293 Abnormal posture: Secondary | ICD-10-CM

## 2019-06-09 DIAGNOSIS — M25511 Pain in right shoulder: Secondary | ICD-10-CM

## 2019-06-09 DIAGNOSIS — M25512 Pain in left shoulder: Secondary | ICD-10-CM

## 2019-06-09 NOTE — Therapy (Signed)
[place], [place], [address] Phone: [phone]   Fax:  [phone]  Physical Therapy Treatment  Patient Details  Name: Randall Bradley MRN: [id number] Date of Birth: 01-Nov-1955 Referring Provider (PT): [name], MD   Encounter Date: 06/09/2019  PT End of Session - 06/09/19 0856    Visit Number  3    Number of Visits  5    Date for PT Re-Evaluation  06/26/19    Authorization Type  BCBS    PT Start Time  0845    PT Stop Time  0928    PT Time Calculation (min)  43 min    Activity Tolerance  Patient tolerated treatment well    Behavior During Therapy  [facility] for tasks assessed/performed       Past Medical History:  Diagnosis Date  . Alopecia   . Arthritis    "knees"  . Hematuria   . Hemochromatosis   . Hereditary hemochromatosis ([place]) monitored by PCP  Randall Bradley   history of phlebotomies -- last one 2014 (pt states body has normalized)  . History of bladder stone   . Hypertension   . Kidney stones   . Prostate cancer ([place])    UROLOGIST-- Randall Diona Fanti; S/P PROSTATECTOMY 03-17-2008  . Right ureteral stone   . Rosacea     Past Surgical History:  Procedure Laterality Date  . APPENDECTOMY  1963  . COLONOSCOPY    . COMPLEX WOUND CLOSURE Left 03-26-2009   INDEX FINGER COMPLEX WOUND LACERATION REPAIR  . CYSTOLITHOLAPAXY AND URETHRAL DILATION  10-05-2009  . CYSTOSCOPY W/ RETROGRADES Right 10/11/2014   Procedure: CYSTOSCOPY WITH RETROGRADE PYELOGRAM;  Surgeon: Randall Gallo, MD;  Location: [facility];  Service: Urology;  Laterality: Right;  . HOLMIUM LASER APPLICATION N/A 0000000   Procedure: HOLMIUM LASER LITHOTRIPSY, ;  Surgeon: Randall Gallo, MD;  Location: [facility];  Service: Urology;  Laterality: N/A;  . JOINT REPLACEMENT    . KNEE ARTHROSCOPY Left 1995  . LIPOMA EXCISION  03-04-2000   forehead  . ROBOT ASSISTED LAPAROSCOPIC RADICAL  PROSTATECTOMY  03-17-2008   w/  BILATERAL PELVIC LYMPHADENECTOMY (NON-NERVE SPARING)  . TOTAL KNEE ARTHROPLASTY Left 04/27/2015   Procedure: TOTAL KNEE ARTHROPLASTY;  Surgeon: Randall Lights, MD;  Location: [place];  Service: Orthopedics;  Laterality: Left;  . TOTAL KNEE ARTHROPLASTY Right 06/08/2015  . TOTAL KNEE ARTHROPLASTY Right 06/08/2015   Procedure: TOTAL RIGHT KNEE ARTHROPLASTY;  Surgeon: Randall Lights, MD;  Location: [place];  Service: Orthopedics;  Laterality: Right;    There were no vitals filed for this visit.  Subjective Assessment - 06/09/19 0849    Subjective  Very sore after needling but it did wear off. Still get some pain into arm, been working on house and have to use my arms to get up with TKAs. Left arm is much better, still feel Rt arm when I abduct.    Patient Stated Goals  sleep on Rt side.    Currently in Pain?  No/denies         [facility] PT Assessment - 06/09/19 0001      Special Tests   Other special tests  - RC impingement in passive motions                   [facility] Adult PT Treatment/Exercise - 06/09/19 0001      Shoulder Exercises: Prone   Other Prone Exercises  scap retraction,  IYT & ER 2lb      Shoulder Exercises: Stretch   Other Shoulder Stretches  IR stretch with strap      Manual Therapy   Manual Therapy  Manual Traction    Joint Mobilization  GHJ AP & inf at end range abd    Soft tissue mobilization  Lt upper trap    Manual Traction  cervical                  PT Long Term Goals - 05/26/19 0923      PT LONG TERM GOAL #1   Title  GHJ ER & flx strength to 5/5    Baseline  4/5 at eval    Time  4    Period  Weeks    Status  New    Target Date  06/26/19      PT LONG TERM GOAL #2   Title  Pt will perform full GHJ AROM without impingement    Baseline  bil pain at eval    Time  4    Period  Weeks    Status  New    Target Date  06/26/19      PT LONG TERM GOAL #3   Title  Pt will demo proper resting posture     Baseline  rounded shoulders at eval    Time  4    Period  Weeks    Status  New    Target Date  06/26/19      PT LONG TERM GOAL #4   Title  pt will be able to lift, reach and pull without limitation by shoulder pain    Baseline  limited at eval    Time  4    Period  Weeks    Status  New    Target Date  06/26/19      PT LONG TERM GOAL #5   Title  .            Plan - 06/09/19 0913    Clinical Impression Statement  Challenged strength through prone exercises. Cues for proper scap retraction and avoiding impingement with motion.    PT Treatment/Interventions  ADLs/Self Care Home Management;Cryotherapy;Electrical Stimulation;Iontophoresis 4mg /ml Dexamethasone;Moist Heat;Traction;Ultrasound;Therapeutic exercise;Therapeutic activities;Functional mobility training;Neuromuscular re-education;Patient/family education;Dry needling;Passive range of motion;Manual techniques;Taping;Spinal Manipulations;Joint Manipulations    PT Next Visit Plan  cont periscap strengthening- standing position    PT Home Exercise Plan  RLHHDCPZ  pec stretch- door, hooklying, behind back; row, lawn mower, open book, prone IYT & ER    Consulted and Agree with Plan of Care  Patient       Patient will benefit from skilled therapeutic intervention in order to improve the following deficits and impairments:  Increased muscle spasms, Decreased activity tolerance, Pain, Improper body mechanics, Impaired flexibility, Decreased strength, Postural dysfunction  Visit Diagnosis: Right shoulder pain, unspecified chronicity  Left shoulder pain, unspecified chronicity  Abnormal posture     Problem List Patient Active Problem List   Diagnosis Date Noted  . Acute upper respiratory infection 05/29/2018  . Allergic rhinitis 05/29/2018  . Anorexia 05/29/2018  . Artificial knee joint present 05/29/2018  . Asthma without status asthmaticus 05/29/2018  . Cough 05/29/2018  . Disorder of iron metabolism 05/29/2018  .  Encounter for general adult medical examination without abnormal findings 05/29/2018  . Fatigue 05/29/2018  . History of colon polyps 05/29/2018  . Inguinal hernia without obstruction or gangrene 05/29/2018  . Insomnia due to medical condition 05/29/2018  .  Malignant neoplasm of prostate ([place]) 05/29/2018  . Osteoarthrosis involving more than one site but not generalized 05/29/2018  . Other long term (current) drug therapy 05/29/2018  . Other specified extrapyramidal and movement disorders 05/29/2018  . Pain in right knee 05/29/2018  . Pure hypercholesterolemia 05/29/2018  . Dextroscoliosis 08/01/2015  . Essential hypertension 06/30/2015  . Family history of heart disease 06/30/2015  . Left ventricular hypertrophy due to hypertensive disease 06/30/2015  . Left ventricular diastolic dysfunction, NYHA class 1 06/30/2015  . Fast heart beat 05/11/2015  . DJD (degenerative joint disease) of knee 04/27/2015    Alvita Fana C. Varnell Orvis PT, DPT 06/09/19 3:11 PM   [facility] [address] [place], [place], [address] Phone: [phone]   Fax:  [phone]  Name: CORRIN HUBBART MRN: [id number] Date of Birth: 1956/05/19

## 2019-06-16 ENCOUNTER — Encounter: Payer: Self-pay | Admitting: Physical Therapy

## 2019-06-16 ENCOUNTER — Other Ambulatory Visit: Payer: Self-pay

## 2019-06-16 ENCOUNTER — Ambulatory Visit: Payer: BC Managed Care – PPO | Admitting: Physical Therapy

## 2019-06-16 DIAGNOSIS — R293 Abnormal posture: Secondary | ICD-10-CM

## 2019-06-16 DIAGNOSIS — M25512 Pain in left shoulder: Secondary | ICD-10-CM

## 2019-06-16 DIAGNOSIS — M25511 Pain in right shoulder: Secondary | ICD-10-CM

## 2019-06-16 NOTE — Therapy (Signed)
[place], [place], [address] Phone: [phone]   Fax:  [phone]  Physical Therapy Treatment  Patient Details  Name: Randall Bradley MRN: [id number] Date of Birth: 24-Aug-1955 Referring Provider (PT): [name], MD   Encounter Date: 06/16/2019  PT End of Session - 06/16/19 0847    Visit Number  4    Number of Visits  5    Date for PT Re-Evaluation  06/26/19    Authorization Type  BCBS    PT Start Time  [phone]    PT Stop Time  0926    PT Time Calculation (min)  40 min    Activity Tolerance  Patient tolerated treatment well    Behavior During Therapy  [facility] for tasks assessed/performed       Past Medical History:  Diagnosis Date  . Alopecia   . Arthritis    "knees"  . Hematuria   . Hemochromatosis   . Hereditary hemochromatosis ([place]) monitored by PCP  dr [name] gates   history of phlebotomies -- last one 2014 (pt states body has normalized)  . History of bladder stone   . Hypertension   . Kidney stones   . Prostate cancer ([place])    UROLOGIST-- DR Diona Fanti; S/P PROSTATECTOMY 03-17-2008  . Right ureteral stone   . Rosacea     Past Surgical History:  Procedure Laterality Date  . APPENDECTOMY  1963  . COLONOSCOPY    . COMPLEX WOUND CLOSURE Left 03-26-2009   INDEX FINGER COMPLEX WOUND LACERATION REPAIR  . CYSTOLITHOLAPAXY AND URETHRAL DILATION  10-05-2009  . CYSTOSCOPY W/ RETROGRADES Right 10/11/2014   Procedure: CYSTOSCOPY WITH RETROGRADE PYELOGRAM;  Surgeon: Franchot Gallo, MD;  Location[facility];  Service: Urology;  Laterality: Right;  . HOLMIUM LASER APPLICATION N/A 0000000   Procedure: HOLMIUM LASER LITHOTRIPSY, ;  Surgeon: Franchot Gallo, MD;  Location: [facility];  Service: Urology;  Laterality: N/A;  . JOINT REPLACEMENT    . KNEE ARTHROSCOPY Left 1995  . LIPOMA EXCISION  03-04-2000   forehead  . ROBOT ASSISTED LAPAROSCOPIC RADICAL  PROSTATECTOMY  03-17-2008   w/  BILATERAL PELVIC LYMPHADENECTOMY (NON-NERVE SPARING)  . TOTAL KNEE ARTHROPLASTY Left 04/27/2015   Procedure: TOTAL KNEE ARTHROPLASTY;  Surgeon: [name], MD;  Location: [place];  Service: Orthopedics;  Laterality: Left;  . TOTAL KNEE ARTHROPLASTY Right 06/08/2015  . TOTAL KNEE ARTHROPLASTY Right 06/08/2015   Procedure: TOTAL RIGHT KNEE ARTHROPLASTY;  Surgeon: [name], MD;  Location: [place];  Service: Orthopedics;  Laterality: Right;    There were no vitals filed for this visit.  Subjective Assessment - 06/16/19 0848    Subjective  Rotation exercises make Right shoulder hurt.    Patient Stated Goals  sleep on Rt side.    Currently in Pain?  Yes    Pain Location  Shoulder    Pain Orientation  Right                       [facility] Adult PT Treatment/Exercise - 06/16/19 0001      Shoulder Exercises: Standing   External Rotation Limitations  ER in abduction    Extension Limitations  bent over triceps kick    Other Standing Exercises  bent over ER with 3lb    Other Standing Exercises  wall sit with Y      Manual Therapy   Manual therapy comments  skilled palpation and monitoring  during TPDN    Soft tissue mobilization  Rt ant & med deltoid, biceps, pec stretching, IR stretching       Trigger Point Dry Needling - 06/16/19 0001    Muscles Treated Upper Quadrant  Biceps    Deltoid Response  Twitch response elicited;Palpable increased muscle length   anterior and medial bellies, Right   Biceps Response  Twitch response elicited;Palpable increased muscle length   Right               PT Long Term Goals - 05/26/19 0923      PT LONG TERM GOAL #1   Title  GHJ ER & flx strength to 5/5    Baseline  4/5 at eval    Time  4    Period  Weeks    Status  New    Target Date  06/26/19      PT LONG TERM GOAL #2   Title  Pt will perform full GHJ AROM without impingement    Baseline  bil pain at eval    Time  4    Period   Weeks    Status  New    Target Date  06/26/19      PT LONG TERM GOAL #3   Title  Pt will demo proper resting posture    Baseline  rounded shoulders at eval    Time  4    Period  Weeks    Status  New    Target Date  06/26/19      PT LONG TERM GOAL #4   Title  pt will be able to lift, reach and pull without limitation by shoulder pain    Baseline  limited at eval    Time  4    Period  Weeks    Status  New    Target Date  06/26/19      PT LONG TERM GOAL #5   Title  .            Plan - 06/16/19 0957    Clinical Impression Statement  Pt reported complete resolution of pain following treatment today. Adapted prone exercises to standing positions since he was having pain in HEP. Able to retract after releasing anterior deltoid and biceps and no impingement in full passive scaption and IR. Next visit is his last one and I asked him to think about if he feels he will need to extend POC at 1/week or if he feels ready to d/c to independent program.    PT Treatment/Interventions  ADLs/Self Care Home Management;Cryotherapy;Electrical Stimulation;Iontophoresis 4mg /ml Dexamethasone;Moist Heat;Traction;Ultrasound;Therapeutic exercise;Therapeutic activities;Functional mobility training;Neuromuscular re-education;Patient/family education;Dry needling;Passive range of motion;Manual techniques;Taping;Spinal Manipulations;Joint Manipulations    PT Next Visit Plan  extend V. D/C?   review prone position exercises    PT Home Exercise Plan  RLHHDCPZ  pec stretch- door, hooklying, behind back; row, lawn mower, open book, prone IYT & ER    Consulted and Agree with Plan of Care  Patient       Patient will benefit from skilled therapeutic intervention in order to improve the following deficits and impairments:  Increased muscle spasms, Decreased activity tolerance, Pain, Improper body mechanics, Impaired flexibility, Decreased strength, Postural dysfunction  Visit Diagnosis: Right shoulder pain,  unspecified chronicity  Left shoulder pain, unspecified chronicity  Abnormal posture     Problem List Patient Active Problem List   Diagnosis Date Noted  . Acute upper respiratory infection 05/29/2018  . Allergic rhinitis 05/29/2018  . Anorexia  05/29/2018  . Artificial knee joint present 05/29/2018  . Asthma without status asthmaticus 05/29/2018  . Cough 05/29/2018  . Disorder of iron metabolism 05/29/2018  . Encounter for general adult medical examination without abnormal findings 05/29/2018  . Fatigue 05/29/2018  . History of colon polyps 05/29/2018  . Inguinal hernia without obstruction or gangrene 05/29/2018  . Insomnia due to medical condition 05/29/2018  . Malignant neoplasm of prostate ([place]) 05/29/2018  . Osteoarthrosis involving more than one site but not generalized 05/29/2018  . Other long term (current) drug therapy 05/29/2018  . Other specified extrapyramidal and movement disorders 05/29/2018  . Pain in right knee 05/29/2018  . Pure hypercholesterolemia 05/29/2018  . Dextroscoliosis 08/01/2015  . Essential hypertension 06/30/2015  . Family history of heart disease 06/30/2015  . Left ventricular hypertrophy due to hypertensive disease 06/30/2015  . Left ventricular diastolic dysfunction, NYHA class 1 06/30/2015  . Fast heart beat 05/11/2015  . DJD (degenerative joint disease) of knee 04/27/2015    Keaundra Stehle C. Jleigh Striplin PT, DPT 06/16/19 10:00 AM   [place], [place], [address] Phone: [phone]   Fax:  [phone]  Name: Randall Bradley MRN: [id number] Date of Birth: 05/08/1956

## 2019-06-23 ENCOUNTER — Ambulatory Visit: Payer: BC Managed Care – PPO | Admitting: Physical Therapy

## 2019-06-23 ENCOUNTER — Encounter: Payer: Self-pay | Admitting: Physical Therapy

## 2019-06-23 ENCOUNTER — Other Ambulatory Visit: Payer: Self-pay

## 2019-06-23 DIAGNOSIS — G8929 Other chronic pain: Secondary | ICD-10-CM

## 2019-06-23 DIAGNOSIS — M25511 Pain in right shoulder: Secondary | ICD-10-CM | POA: Diagnosis not present

## 2019-06-23 DIAGNOSIS — R293 Abnormal posture: Secondary | ICD-10-CM

## 2019-06-23 DIAGNOSIS — M25512 Pain in left shoulder: Secondary | ICD-10-CM

## 2019-06-23 NOTE — Therapy (Signed)
[place], [place], [address] Phone: [phone]   Fax:  [phone]  Physical Therapy Treatment  Patient Details  Name: Randall Bradley MRN: [id number] Date of Birth: [birth year] Referring Provider (PT): [name], MD   Encounter Date: 06/23/2019  PT End of Session - 06/23/19 0915    Visit Number  5    Number of Visits  --    Date for PT Re-Evaluation  08/18/19    Authorization Type  BCBS    PT Start Time  0830    PT Stop Time  0912    PT Time Calculation (min)  42 min    Activity Tolerance  Patient tolerated treatment well    Behavior During Therapy  [facility] for tasks assessed/performed       Past Medical History:  Diagnosis Date  . Alopecia   . Arthritis    "knees"  . Hematuria   . Hemochromatosis   . Hereditary hemochromatosis ([place]) monitored by PCP  dr [name] gates   history of phlebotomies -- last one 2014 (pt states body has normalized)  . History of bladder stone   . Hypertension   . Kidney stones   . Prostate cancer ([place])    UROLOGIST-- DR Diona Fanti; S/P PROSTATECTOMY 03-17-2008  . Right ureteral stone   . Rosacea     Past Surgical History:  Procedure Laterality Date  . APPENDECTOMY  1963  . COLONOSCOPY    . COMPLEX WOUND CLOSURE Left 03-26-2009   INDEX FINGER COMPLEX WOUND LACERATION REPAIR  . CYSTOLITHOLAPAXY AND URETHRAL DILATION  10-05-2009  . CYSTOSCOPY W/ RETROGRADES Right 10/11/2014   Procedure: CYSTOSCOPY WITH RETROGRADE PYELOGRAM;  Surgeon: Franchot Gallo, MD;  Location: [facility];  Service: Urology;  Laterality: Right;  . HOLMIUM LASER APPLICATION N/A 4/62/7035   Procedure: HOLMIUM LASER LITHOTRIPSY, ;  Surgeon: Franchot Gallo, MD;  Location: [facility];  Service: Urology;  Laterality: N/A;  . JOINT REPLACEMENT    . KNEE ARTHROSCOPY Left 1995  . LIPOMA EXCISION  03-04-2000   forehead  . ROBOT ASSISTED LAPAROSCOPIC RADICAL  PROSTATECTOMY  03-17-2008   w/  BILATERAL PELVIC LYMPHADENECTOMY (NON-NERVE SPARING)  . TOTAL KNEE ARTHROPLASTY Left 04/27/2015   Procedure: TOTAL KNEE ARTHROPLASTY;  Surgeon: [name], MD;  Location: [place];  Service: Orthopedics;  Laterality: Left;  . TOTAL KNEE ARTHROPLASTY Right 06/08/2015  . TOTAL KNEE ARTHROPLASTY Right 06/08/2015   Procedure: TOTAL RIGHT KNEE ARTHROPLASTY;  Surgeon: [name], MD;  Location: [place];  Service: Orthopedics;  Laterality: Right;    There were no vitals filed for this visit.  Subjective Assessment - 06/23/19 0840    Subjective  I am getting ready to finish a renovation, I would like to be able to check in with you in about 3-4 weeks.    Currently in Pain?  Yes    Pain Location  Shoulder    Pain Orientation  Right;Left;Anterior    Pain Descriptors / Indicators  Discomfort;Tightness    Pain Type  Chronic pain    Pain Onset  More than a month ago    Pain Frequency  Intermittent    Aggravating Factors   reching back , turning over on his side.  Wakes at night.    Effect of Pain on Daily Activities  limits comfort with activities    Multiple Pain Sites  No         [facility] PT Assessment - 06/23/19 0001  AROM   Overall AROM Comments  WFL all planes       Strength   Overall Strength Comments  Shoulder flexion Rt 5/5, Lt  4+/5 , abduction Rt 5/5, Lt. 4+/5 and biceps WNL         [facility] Adult PT Treatment/Exercise - 06/23/19 0001      Self-Care   Self-Care  Lifting;Posture;Other Self-Care Comments    Other Self-Care Comments   see education      Shoulder Exercises: Standing   Horizontal ABduction  Strengthening;Both;20 reps;Theraband    Theraband Level (Shoulder Horizontal ABduction)  Level 3 (Green)    External Rotation  Strengthening    Theraband Level (Shoulder External Rotation)  Level 3 (Green)    Flexion  Strengthening;Both;20 reps;Theraband    Shoulder Flexion Weight (lbs)  narrow grip on wall on roller       Shoulder  Exercises: ROM/Strengthening   UBE (Upper Arm Bike)  reverse L 3 for 5 min       Shoulder Exercises: Stretch   Corner Stretch  3 reps;30 seconds    Cross Chest Stretch  3 reps;30 seconds      Manual Therapy   Manual therapy comments  all planes bilateral UEs              PT Education - 06/23/19 0914    Education Details  reissued HEP, prognosis and  POC going forward    Person(s) Educated  Patient    Methods  Explanation;Demonstration;Handout    Comprehension  Verbalized understanding;Returned demonstration          PT Long Term Goals - 06/23/19 0909      PT LONG TERM GOAL #1   Title  GHJ ER & flx strength to 5/5    Status  Achieved      PT LONG TERM GOAL #2   Title  Pt will perform full GHJ AROM without impingement    Status  Achieved      PT LONG TERM GOAL #3   Title  Pt will demo proper resting posture    Status  Achieved      PT LONG TERM GOAL #4   Title  pt will be able to lift, reach and pull without limitation by shoulder pain    Baseline  varies, can be painful at times    Status  Partially Met            Plan - 06/23/19 0916    Clinical Impression Statement  Patient has met many of his goals.  He is concerned about recurrence but is pleased with his outcome thus far.  He would like to check and get updates for HEP and treatment for pain as needed.  Renewed today for anohter 8 weeks but will take 3-4 weeks off initially as he completes a renovation on his home.    Examination-Activity Limitations  Bathing;Bed Mobility;Reach Overhead;Carry;Sleep;Dressing;Lift    Examination-Participation Restrictions  Cleaning;Meal Prep;Yard Work    Stability/Clinical Decision Making  Stable/Uncomplicated    Rehab Potential  Excellent    PT Frequency  Biweekly    PT Duration  8 weeks    PT Treatment/Interventions  ADLs/Self Care Home Management;Cryotherapy;Electrical Stimulation;Iontophoresis 82m/ml Dexamethasone;Moist Heat;Traction;Ultrasound;Therapeutic  exercise;Therapeutic activities;Functional mobility training;Neuromuscular re-education;Patient/family education;Dry needling;Passive range of motion;Manual techniques;Taping;Spinal Manipulations;Joint Manipulations    PT Next Visit Plan  update status, pain control as needed, upper back strength as needed    PT Home Exercise Plan  RLHHDCPZ  pec stretch- door, hooklying, behind back;  row, lawn mower, open book, prone IYT & ER    Consulted and Agree with Plan of Care  Patient       Patient will benefit from skilled therapeutic intervention in order to improve the following deficits and impairments:  Increased muscle spasms, Decreased activity tolerance, Pain, Improper body mechanics, Impaired flexibility, Decreased strength, Postural dysfunction  Visit Diagnosis: Right shoulder pain, unspecified chronicity  Left shoulder pain, unspecified chronicity  Abnormal posture  Chronic right-sided low back pain without sciatica     Problem List Patient Active Problem List   Diagnosis Date Noted  . Acute upper respiratory infection 05/29/2018  . Allergic rhinitis 05/29/2018  . Anorexia 05/29/2018  . Artificial knee joint present 05/29/2018  . Asthma without status asthmaticus 05/29/2018  . Cough 05/29/2018  . Disorder of iron metabolism 05/29/2018  . Encounter for general adult medical examination without abnormal findings 05/29/2018  . Fatigue 05/29/2018  . History of colon polyps 05/29/2018  . Inguinal hernia without obstruction or gangrene 05/29/2018  . Insomnia due to medical condition 05/29/2018  . Malignant neoplasm of prostate ([place]) 05/29/2018  . Osteoarthrosis involving more than one site but not generalized 05/29/2018  . Other long term (current) drug therapy 05/29/2018  . Other specified extrapyramidal and movement disorders 05/29/2018  . Pain in right knee 05/29/2018  . Pure hypercholesterolemia 05/29/2018  . Dextroscoliosis 08/01/2015  . Essential hypertension 06/30/2015   . Family history of heart disease 06/30/2015  . Left ventricular hypertrophy due to hypertensive disease 06/30/2015  . Left ventricular diastolic dysfunction, NYHA class 1 06/30/2015  . Fast heart beat 05/11/2015  . DJD (degenerative joint disease) of knee 04/27/2015    PAA,JENNIFER 06/23/2019, 12:26 PM  [facility] [address] [place], [place], [address] Phone: [phone]   Fax:  [phone]  Name: Randall Bradley MRN: [id number] Date of Birth: 11-02-1955  Raeford Razor, PT 06/23/19 12:26 PM Phone: [phone] Fax: [phone]

## 2019-07-21 ENCOUNTER — Encounter: Payer: Self-pay | Admitting: Physical Therapy

## 2019-07-21 ENCOUNTER — Other Ambulatory Visit: Payer: Self-pay

## 2019-07-21 ENCOUNTER — Ambulatory Visit: Payer: BC Managed Care – PPO | Attending: Internal Medicine | Admitting: Physical Therapy

## 2019-07-21 DIAGNOSIS — M25511 Pain in right shoulder: Secondary | ICD-10-CM

## 2019-07-21 DIAGNOSIS — M25512 Pain in left shoulder: Secondary | ICD-10-CM | POA: Diagnosis present

## 2019-07-21 DIAGNOSIS — G8929 Other chronic pain: Secondary | ICD-10-CM | POA: Diagnosis present

## 2019-07-21 DIAGNOSIS — R293 Abnormal posture: Secondary | ICD-10-CM | POA: Diagnosis present

## 2019-07-21 DIAGNOSIS — M546 Pain in thoracic spine: Secondary | ICD-10-CM

## 2019-07-21 DIAGNOSIS — M545 Low back pain: Secondary | ICD-10-CM | POA: Insufficient documentation

## 2019-07-21 NOTE — Therapy (Signed)
[place], [place], [address] Phone: [phone]   Fax:  [phone]  Physical Therapy Treatment  Patient Details  Name: Randall Bradley MRN: [id number] Date of Birth: 1955/07/19 Referring Provider (PT): [name], MD   Encounter Date: 07/21/2019  PT End of Session - 07/21/19 1009    Visit Number  6    Date for PT Re-Evaluation  08/18/19    Authorization Type  BCBS    PT Start Time  0920    PT Stop Time  1005    PT Time Calculation (min)  45 min    Activity Tolerance  Patient tolerated treatment well    Behavior During Therapy  [facility] for tasks assessed/performed       Past Medical History:  Diagnosis Date  . Alopecia   . Arthritis    "knees"  . Hematuria   . Hemochromatosis   . Hereditary hemochromatosis ([place]) monitored by PCP  dr [name] gates   history of phlebotomies -- last one 2014 (pt states body has normalized)  . History of bladder stone   . Hypertension   . Kidney stones   . Prostate cancer ([place])    UROLOGIST-- DR Diona Fanti; S/P PROSTATECTOMY 03-17-2008  . Right ureteral stone   . Rosacea     Past Surgical History:  Procedure Laterality Date  . APPENDECTOMY  1963  . COLONOSCOPY    . COMPLEX WOUND CLOSURE Left 03-26-2009   INDEX FINGER COMPLEX WOUND LACERATION REPAIR  . CYSTOLITHOLAPAXY AND URETHRAL DILATION  10-05-2009  . CYSTOSCOPY W/ RETROGRADES Right 10/11/2014   Procedure: CYSTOSCOPY WITH RETROGRADE PYELOGRAM;  Surgeon: Franchot Gallo, MD;  Location: [facility];  Service: Urology;  Laterality: Right;  . HOLMIUM LASER APPLICATION N/A 6/33/3545   Procedure: HOLMIUM LASER LITHOTRIPSY, ;  Surgeon: Franchot Gallo, MD;  Location[facility];  Service: Urology;  Laterality: N/A;  . JOINT REPLACEMENT    . KNEE ARTHROSCOPY Left 1995  . LIPOMA EXCISION  03-04-2000   forehead  . ROBOT ASSISTED LAPAROSCOPIC RADICAL PROSTATECTOMY  03-17-2008   w/   BILATERAL PELVIC LYMPHADENECTOMY (NON-NERVE SPARING)  . TOTAL KNEE ARTHROPLASTY Left 04/27/2015   Procedure: TOTAL KNEE ARTHROPLASTY;  Surgeon: [name], MD;  Location: [employer];  Service: Orthopedics;  Laterality: Left;  . TOTAL KNEE ARTHROPLASTY Right 06/08/2015  . TOTAL KNEE ARTHROPLASTY Right 06/08/2015   Procedure: TOTAL RIGHT KNEE ARTHROPLASTY;  Surgeon: [name], MD;  Location: [place];  Service: Orthopedics;  Laterality: Right;    There were no vitals filed for this visit.  Subjective Assessment - 07/21/19 0923    Subjective  I can manage my back pain and my shoulders are more mobile.  It still hurts mostly at night.  I think I might need to see a specialist.    Currently in Pain?  No/denies   not at rest.   Aggravating Factors   pushing up from the floor, keeps up at night         [facility] Adult PT Treatment/Exercise - 07/21/19 0001      Self-Care   Self-Care  Posture;Other Self-Care Comments    Posture  seated, capsule position     Other Self-Care Comments   Ortho referral, options, special testing , tendinopathy      Shoulder Exercises: Standing   Other Standing Exercises  standing cane mobility, overhead flexion, abduction, extension and internal rotation x 10 each side  Shoulder Exercises: ROM/Strengthening   Other ROM/Strengthening Exercises  stretch out strap 3 different levels each UE for about 40 sec hold: flexion , cross body post capsule and extension      Modalities   Modalities  Iontophoresis      Iontophoresis   Type of Iontophoresis  Dexamethasone    Location  bilateral ant shoulder     Dose  1 cc x 2    Time  6 hour patchx 2           PT Long Term Goals - 07/21/19 0934      PT LONG TERM GOAL #1   Title  GHJ ER & flx strength to 5/5    Baseline  5/5 in flexion , 4+/5 in L abduction , pain abduction    Status  Partially Met      PT LONG TERM GOAL #2   Title  Pt will perform full GHJ AROM without impingement    Baseline  pain end  range and with pos empty can    Status  Partially Met      PT LONG TERM GOAL #3   Title  Pt will demo proper resting posture    Status  Achieved      PT LONG TERM GOAL #4   Title  pt will be able to lift, reach and pull without limitation by shoulder pain    Baseline  varies, can be painful at times    Status  On-going            Plan - 07/21/19 9381    Clinical Impression Statement  Pt cont with pain in bilateral anterior shoulder , pos empty can bilaterally. He would like to come in for a couple of visits for improved shoulder mobility and possibly dry needling as it has helped him in the past. He plans to see Ortho in the meantime for more imaging or treatment options.    PT Treatment/Interventions  ADLs/Self Care Home Management;Cryotherapy;Electrical Stimulation;Iontophoresis '4mg'$ /ml Dexamethasone;Moist Heat;Traction;Ultrasound;Therapeutic exercise;Therapeutic activities;Functional mobility training;Neuromuscular re-education;Patient/family education;Dry needling;Passive range of motion;Manual techniques;Taping;Spinal Manipulations;Joint Manipulations    PT Next Visit Plan  how was ionto? strethc out strap, prone ex and mobility    PT Home Exercise Plan  RLHHDCPZ  pec stretch- door, hooklying, behind back; row, lawn mower, open book, prone IYT & ER    Consulted and Agree with Plan of Care  Patient       Patient will benefit from skilled therapeutic intervention in order to improve the following deficits and impairments:  Increased muscle spasms, Decreased activity tolerance, Pain, Improper body mechanics, Impaired flexibility, Decreased strength, Postural dysfunction  Visit Diagnosis: Right shoulder pain, unspecified chronicity  Left shoulder pain, unspecified chronicity  Abnormal posture  Chronic right-sided low back pain without sciatica  Pain in thoracic spine     Problem List Patient Active Problem List   Diagnosis Date Noted  . Acute upper respiratory infection  05/29/2018  . Allergic rhinitis 05/29/2018  . Anorexia 05/29/2018  . Artificial knee joint present 05/29/2018  . Asthma without status asthmaticus 05/29/2018  . Cough 05/29/2018  . Disorder of iron metabolism 05/29/2018  . Encounter for general adult medical examination without abnormal findings 05/29/2018  . Fatigue 05/29/2018  . History of colon polyps 05/29/2018  . Inguinal hernia without obstruction or gangrene 05/29/2018  . Insomnia due to medical condition 05/29/2018  . Malignant neoplasm of prostate ([place]) 05/29/2018  . Osteoarthrosis involving more than one site but not generalized 05/29/2018  .  Other long term (current) drug therapy 05/29/2018  . Other specified extrapyramidal and movement disorders 05/29/2018  . Pain in right knee 05/29/2018  . Pure hypercholesterolemia 05/29/2018  . Dextroscoliosis 08/01/2015  . Essential hypertension 06/30/2015  . Family history of heart disease 06/30/2015  . Left ventricular hypertrophy due to hypertensive disease 06/30/2015  . Left ventricular diastolic dysfunction, NYHA class 1 06/30/2015  . Fast heart beat 05/11/2015  . DJD (degenerative joint disease) of knee 04/27/2015    Oron Westrup 07/21/2019, 12:44 PM  [place], [place], [address] Phone: [phone]   Fax:  [phone]  Name: ABEL HAGEMAN MRN: [id number] Date of Birth: 30-Jan-1956  Raeford Razor, PT 07/21/19 12:44 PM Phone: [phone] Fax: [phone]

## 2019-08-05 ENCOUNTER — Other Ambulatory Visit: Payer: Self-pay

## 2019-08-05 ENCOUNTER — Ambulatory Visit: Payer: BC Managed Care – PPO | Attending: Internal Medicine | Admitting: Physical Therapy

## 2019-08-05 ENCOUNTER — Encounter: Payer: Self-pay | Admitting: Physical Therapy

## 2019-08-05 DIAGNOSIS — R293 Abnormal posture: Secondary | ICD-10-CM

## 2019-08-05 DIAGNOSIS — M25512 Pain in left shoulder: Secondary | ICD-10-CM | POA: Insufficient documentation

## 2019-08-05 DIAGNOSIS — M25511 Pain in right shoulder: Secondary | ICD-10-CM | POA: Insufficient documentation

## 2019-08-05 NOTE — Therapy (Signed)
[place], [place], [address] Phone: [phone]   Fax:  [phone]  Physical Therapy Treatment  Patient Details  Name: Randall Bradley MRN: [id number] Date of Birth: April 21, 1956 Referring Provider (PT): [name], MD   Encounter Date: 08/05/2019  PT End of Session - 08/05/19 1031    Visit Number  7    Date for PT Re-Evaluation  08/18/19    Authorization Type  BCBS    PT Start Time  1026   pt arrived late   PT Stop Time  1115    PT Time Calculation (min)  49 min    Activity Tolerance  Patient tolerated treatment well    Behavior During Therapy  [facility] for tasks assessed/performed       Past Medical History:  Diagnosis Date  . Alopecia   . Arthritis    "knees"  . Hematuria   . Hemochromatosis   . Hereditary hemochromatosis ([place]) monitored by PCP  dr [name] gates   history of phlebotomies -- last one 2014 (pt states body has normalized)  . History of bladder stone   . Hypertension   . Kidney stones   . Prostate cancer ([place])    UROLOGIST-- DR Diona Fanti; S/P PROSTATECTOMY 03-17-2008  . Right ureteral stone   . Rosacea     Past Surgical History:  Procedure Laterality Date  . APPENDECTOMY  1963  . COLONOSCOPY    . COMPLEX WOUND CLOSURE Left 03-26-2009   INDEX FINGER COMPLEX WOUND LACERATION REPAIR  . CYSTOLITHOLAPAXY AND URETHRAL DILATION  10-05-2009  . CYSTOSCOPY W/ RETROGRADES Right 10/11/2014   Procedure: CYSTOSCOPY WITH RETROGRADE PYELOGRAM;  Surgeon: Franchot Gallo, MD;  Location: [facility];  Service: Urology;  Laterality: Right;  . HOLMIUM LASER APPLICATION N/A 03/10/3845   Procedure: HOLMIUM LASER LITHOTRIPSY, ;  Surgeon: Franchot Gallo, MD;  Location: [facility];  Service: Urology;  Laterality: N/A;  . JOINT REPLACEMENT    . KNEE ARTHROSCOPY Left 1995  . LIPOMA EXCISION  03-04-2000   forehead  . ROBOT ASSISTED LAPAROSCOPIC RADICAL PROSTATECTOMY   03-17-2008   w/  BILATERAL PELVIC LYMPHADENECTOMY (NON-NERVE SPARING)  . TOTAL KNEE ARTHROPLASTY Left 04/27/2015   Procedure: TOTAL KNEE ARTHROPLASTY;  Surgeon: [name], MD;  Location: [place];  Service: Orthopedics;  Laterality: Left;  . TOTAL KNEE ARTHROPLASTY Right 06/08/2015  . TOTAL KNEE ARTHROPLASTY Right 06/08/2015   Procedure: TOTAL RIGHT KNEE ARTHROPLASTY;  Surgeon: [name], MD;  Location: [place];  Service: Orthopedics;  Laterality: Right;    There were no vitals filed for this visit.  Subjective Assessment - 08/05/19 1027    Subjective  moved into house. no kitchen right now. Lt shoulder is fixed, Right shoulder is still hurting. When I lay on my Right side I feel shooting pain. Pulling my arm behind my back makes a difference in how far I can move my arm.    Patient Stated Goals  sleep on Rt side.                       [place] Adult PT Treatment/Exercise - 08/05/19 0001      Shoulder Exercises: Standing   External Rotation  Strengthening    External Rotation Weight (lbs)  3    External Rotation Limitations  at 90 abd      Shoulder Exercises: Stretch   Internal Rotation Stretch Limitations  standing with strap towel under arm  Other Shoulder Stretches  sleeper stretch      Modalities   Modalities  Cryotherapy      Cryotherapy   Number Minutes Cryotherapy  10 Minutes    Cryotherapy Location  Shoulder    Type of Cryotherapy  Ice pack      Iontophoresis   Type of Iontophoresis  Dexamethasone    Location  Rt AC joint    Dose  1cc    Time  6 hr patch      Manual Therapy   Manual therapy comments  skilled palpation and monitoring during TPDN    Joint Mobilization  IR stretch with strap+distraction & traction    Soft tissue mobilization  ant deltoid, subscap, GHJ IR stretching       Trigger Point Dry Needling - 08/05/19 0001    Muscles Treated Upper Quadrant  Subscapularis    Subscapularis Response  Twitch response elicited;Palpable  increased muscle length   right   Deltoid Response  Twitch response elicited;Palpable increased muscle length   Right, anterior          PT Education - 08/05/19 1103    Education Details  anatomy of condition, HEP,    Person(s) Educated  Patient    Methods  Explanation    Comprehension  Verbalized understanding;Need further instruction          PT Long Term Goals - 07/21/19 0934      PT LONG TERM GOAL #1   Title  GHJ ER & flx strength to 5/5    Baseline  5/5 in flexion , 4+/5 in L abduction , pain abduction    Status  Partially Met      PT LONG TERM GOAL #2   Title  Pt will perform full GHJ AROM without impingement    Baseline  pain end range and with pos empty can    Status  Partially Met      PT LONG TERM GOAL #3   Title  Pt will demo proper resting posture    Status  Achieved      PT LONG TERM GOAL #4   Title  pt will be able to lift, reach and pull without limitation by shoulder pain    Baseline  varies, can be painful at times    Status  On-going            Plan - 08/05/19 1144    Clinical Impression Statement  Impingment in abd and IR at Rt [facility] joint with pain while laying on Rt side. Able to perform sleeper stretch without concordant pain. Twitch response from deltoid and subscap to decrease anterior pull on GHJ. Ionto placed again today at [facility] joint.    PT Treatment/Interventions  ADLs/Self Care Home Management;Cryotherapy;Electrical Stimulation;Iontophoresis '4mg'$ /ml Dexamethasone;Moist Heat;Traction;Ultrasound;Therapeutic exercise;Therapeutic activities;Functional mobility training;Neuromuscular re-education;Patient/family education;Dry needling;Passive range of motion;Manual techniques;Taping;Spinal Manipulations;Joint Manipulations    PT Next Visit Plan  ER strengthening, IR stretching    PT Home Exercise Plan  RLHHDCPZ  pec stretch- door, hooklying, behind back; row, lawn mower, open book, prone IYT & ER    Consulted and Agree with Plan of Care  Patient        Patient will benefit from skilled therapeutic intervention in order to improve the following deficits and impairments:  Increased muscle spasms, Decreased activity tolerance, Pain, Improper body mechanics, Impaired flexibility, Decreased strength, Postural dysfunction  Visit Diagnosis: Right shoulder pain, unspecified chronicity  Left shoulder pain, unspecified chronicity  Abnormal posture  Problem List Patient Active Problem List   Diagnosis Date Noted  . Acute upper respiratory infection 05/29/2018  . Allergic rhinitis 05/29/2018  . Anorexia 05/29/2018  . Artificial knee joint present 05/29/2018  . Asthma without status asthmaticus 05/29/2018  . Cough 05/29/2018  . Disorder of iron metabolism 05/29/2018  . Encounter for general adult medical examination without abnormal findings 05/29/2018  . Fatigue 05/29/2018  . History of colon polyps 05/29/2018  . Inguinal hernia without obstruction or gangrene 05/29/2018  . Insomnia due to medical condition 05/29/2018  . Malignant neoplasm of prostate ([place]) 05/29/2018  . Osteoarthrosis involving more than one site but not generalized 05/29/2018  . Other long term (current) drug therapy 05/29/2018  . Other specified extrapyramidal and movement disorders 05/29/2018  . Pain in right knee 05/29/2018  . Pure hypercholesterolemia 05/29/2018  . Dextroscoliosis 08/01/2015  . Essential hypertension 06/30/2015  . Family history of heart disease 06/30/2015  . Left ventricular hypertrophy due to hypertensive disease 06/30/2015  . Left ventricular diastolic dysfunction, NYHA class 1 06/30/2015  . Fast heart beat 05/11/2015  . DJD (degenerative joint disease) of knee 04/27/2015    Akari Defelice C. Vivaan Helseth PT, DPT 08/05/19 1:02 PM   [place] [facility] [address] [place], [place], [address] Phone: [phone]   Fax:  [phone]  Name: Randall Bradley MRN: [id number] Date of Birth:  01/24/1956

## 2019-08-12 ENCOUNTER — Ambulatory Visit: Payer: BC Managed Care – PPO | Admitting: Physical Therapy

## 2019-08-12 ENCOUNTER — Encounter: Payer: Self-pay | Admitting: Physical Therapy

## 2019-08-12 ENCOUNTER — Other Ambulatory Visit: Payer: Self-pay

## 2019-08-12 DIAGNOSIS — R293 Abnormal posture: Secondary | ICD-10-CM

## 2019-08-12 DIAGNOSIS — M25512 Pain in left shoulder: Secondary | ICD-10-CM

## 2019-08-12 DIAGNOSIS — M25511 Pain in right shoulder: Secondary | ICD-10-CM

## 2019-08-12 NOTE — Therapy (Signed)
[place], [place], [address] Phone: [phone]   Fax:  [phone]  Physical Therapy Treatment  Patient Details  Name: Randall Bradley MRN: [id number] Date of Birth: [birth year] Referring Provider (PT): [name], MD   Encounter Date: 08/12/2019  PT End of Session - 08/12/19 1022    Visit Number  8    Date for PT Re-Evaluation  08/18/19    Authorization Type  BCBS    PT Start Time  1015    PT Stop Time  1058    PT Time Calculation (min)  43 min    Activity Tolerance  Patient tolerated treatment well    Behavior During Therapy  [facility] for tasks assessed/performed       Past Medical History:  Diagnosis Date  . Alopecia   . Arthritis    "knees"  . Hematuria   . Hemochromatosis   . Hereditary hemochromatosis ([place]) monitored by PCP  dr [name] gates   history of phlebotomies -- last one 2014 (pt states body has normalized)  . History of bladder stone   . Hypertension   . Kidney stones   . Prostate cancer ([place])    UROLOGIST-- DR Diona Fanti; S/P PROSTATECTOMY 03-17-2008  . Right ureteral stone   . Rosacea     Past Surgical History:  Procedure Laterality Date  . APPENDECTOMY  1963  . COLONOSCOPY    . COMPLEX WOUND CLOSURE Left 03-26-2009   INDEX FINGER COMPLEX WOUND LACERATION REPAIR  . CYSTOLITHOLAPAXY AND URETHRAL DILATION  10-05-2009  . CYSTOSCOPY W/ RETROGRADES Right 10/11/2014   Procedure: CYSTOSCOPY WITH RETROGRADE PYELOGRAM;  Surgeon: Franchot Gallo, MD;  Location[facility];  Service: Urology;  Laterality: Right;  . HOLMIUM LASER APPLICATION N/A 3/89/3734   Procedure: HOLMIUM LASER LITHOTRIPSY, ;  Surgeon: Franchot Gallo, MD;  Location: [facility];  Service: Urology;  Laterality: N/A;  . JOINT REPLACEMENT    . KNEE ARTHROSCOPY Left 1995  . LIPOMA EXCISION  03-04-2000   forehead  . ROBOT ASSISTED LAPAROSCOPIC RADICAL PROSTATECTOMY  03-17-2008   w/   BILATERAL PELVIC LYMPHADENECTOMY (NON-NERVE SPARING)  . TOTAL KNEE ARTHROPLASTY Left 04/27/2015   Procedure: TOTAL KNEE ARTHROPLASTY;  Surgeon: [name], MD;  Location: [place];  Service: Orthopedics;  Laterality: Left;  . TOTAL KNEE ARTHROPLASTY Right 06/08/2015  . TOTAL KNEE ARTHROPLASTY Right 06/08/2015   Procedure: TOTAL RIGHT KNEE ARTHROPLASTY;  Surgeon: [name], MD;  Location: [place];  Service: Orthopedics;  Laterality: Right;    There were no vitals filed for this visit.  Subjective Assessment - 08/12/19 1018    Subjective  Good days and bad. Last night I could not lay on my Rt shoulder but the night before it was fine. I can reach behind my back easily now. Sleep stretch increases pain. No problems with Lt shoulder.    Patient Stated Goals  sleep on Rt side.    Currently in Pain?  No/denies    Aggravating Factors   horiz adduction                       [facility] Adult PT Treatment/Exercise - 08/12/19 0001      Shoulder Exercises: Standing   External Rotation  Strengthening    Theraband Level (Shoulder External Rotation)  Level 1 (Yellow)    External Rotation Limitations  90/90    Extension  Strengthening;Both    Theraband Level (Shoulder Extension)  Level 3 (Green)    Extension Limitations  tactile cues for scapular mobility    Other Standing Exercises  wall push ups      Iontophoresis   Type of Iontophoresis  Dexamethasone    Location  Rt AC joint    Dose  1cc    Time  6 hr patch      Manual Therapy   Manual therapy comments  skilled palpation and monitoring during TPDN    Joint Mobilization  AP GHJ at 90 flx & IR, distraction in standing active IR       Trigger Point Dry Needling - 08/12/19 0001    Muscles Treated Upper Quadrant  Latissimus dorsi    Subscapularis Response  Twitch response elicited;Palpable increased muscle length   Rt   Latissimus dorsi Response  Twitch response elicited;Palpable increased muscle length   Right           PT Education - 08/12/19 1059    Education Details  see plan    Person(s) Educated  Patient    Methods  Explanation;Demonstration;Tactile cues;Verbal cues    Comprehension  Verbalized understanding;Returned demonstration;Tactile cues required;Verbal cues required;Need further instruction          PT Long Term Goals - 07/21/19 0934      PT LONG TERM GOAL #1   Title  GHJ ER & flx strength to 5/5    Baseline  5/5 in flexion , 4+/5 in L abduction , pain abduction    Status  Partially Met      PT LONG TERM GOAL #2   Title  Pt will perform full GHJ AROM without impingement    Baseline  pain end range and with pos empty can    Status  Partially Met      PT LONG TERM GOAL #3   Title  Pt will demo proper resting posture    Status  Achieved      PT LONG TERM GOAL #4   Title  pt will be able to lift, reach and pull without limitation by shoulder pain    Baseline  varies, can be painful at times    Status  On-going            Plan - 08/12/19 1058    Clinical Impression Statement  Retrained thought process on mobility of GHJ by gliding scapula- demo using joint model. Pt reported the thought change significantly impacted his motion and decreased pain. Ionta patched placed once again to continue decrease of inflammation at [facility] joint. Will f/u in 2 weeks.    PT Treatment/Interventions  ADLs/Self Care Home Management;Cryotherapy;Electrical Stimulation;Iontophoresis '4mg'$ /ml Dexamethasone;Moist Heat;Traction;Ultrasound;Therapeutic exercise;Therapeutic activities;Functional mobility training;Neuromuscular re-education;Patient/family education;Dry needling;Passive range of motion;Manual techniques;Taping;Spinal Manipulations;Joint Manipulations    PT Home Exercise Plan  RLHHDCPZ  pec stretch- door, hooklying, behind back; row, lawn mower, open book, prone IYT & ER    Consulted and Agree with Plan of Care  Patient       Patient will benefit from skilled therapeutic intervention  in order to improve the following deficits and impairments:  Increased muscle spasms, Decreased activity tolerance, Pain, Improper body mechanics, Impaired flexibility, Decreased strength, Postural dysfunction  Visit Diagnosis: Right shoulder pain, unspecified chronicity  Left shoulder pain, unspecified chronicity  Abnormal posture     Problem List Patient Active Problem List   Diagnosis Date Noted  . Acute upper respiratory infection 05/29/2018  . Allergic rhinitis 05/29/2018  . Anorexia 05/29/2018  . Artificial knee joint present 05/29/2018  .  Asthma without status asthmaticus 05/29/2018  . Cough 05/29/2018  . Disorder of iron metabolism 05/29/2018  . Encounter for general adult medical examination without abnormal findings 05/29/2018  . Fatigue 05/29/2018  . History of colon polyps 05/29/2018  . Inguinal hernia without obstruction or gangrene 05/29/2018  . Insomnia due to medical condition 05/29/2018  . Malignant neoplasm of prostate ([place]) 05/29/2018  . Osteoarthrosis involving more than one site but not generalized 05/29/2018  . Other long term (current) drug therapy 05/29/2018  . Other specified extrapyramidal and movement disorders 05/29/2018  . Pain in right knee 05/29/2018  . Pure hypercholesterolemia 05/29/2018  . Dextroscoliosis 08/01/2015  . Essential hypertension 06/30/2015  . Family history of heart disease 06/30/2015  . Left ventricular hypertrophy due to hypertensive disease 06/30/2015  . Left ventricular diastolic dysfunction, NYHA class 1 06/30/2015  . Fast heart beat 05/11/2015  . DJD (degenerative joint disease) of knee 04/27/2015    Rosalinda Seaman C. Jaylene Schrom PT, DPT 08/12/19 11:50 AM   [facility] [address] [place], [place], [address] Phone: [phone]   Fax:  [phone]  Name: Randall Bradley MRN: [id number] Date of Birth: Dec 29, 1955

## 2019-08-20 ENCOUNTER — Other Ambulatory Visit: Payer: Self-pay

## 2019-08-24 ENCOUNTER — Other Ambulatory Visit: Payer: Self-pay | Admitting: Cardiovascular Disease

## 2019-08-28 ENCOUNTER — Inpatient Hospital Stay (HOSPITAL_BASED_OUTPATIENT_CLINIC_OR_DEPARTMENT_OTHER): Payer: BC Managed Care – PPO | Admitting: Hematology & Oncology

## 2019-08-28 ENCOUNTER — Inpatient Hospital Stay: Payer: BC Managed Care – PPO | Attending: Hematology & Oncology

## 2019-08-28 ENCOUNTER — Encounter: Payer: Self-pay | Admitting: Physical Therapy

## 2019-08-28 ENCOUNTER — Other Ambulatory Visit: Payer: Self-pay

## 2019-08-28 ENCOUNTER — Encounter: Payer: Self-pay | Admitting: Hematology & Oncology

## 2019-08-28 ENCOUNTER — Ambulatory Visit: Payer: BC Managed Care – PPO | Attending: Internal Medicine | Admitting: Physical Therapy

## 2019-08-28 DIAGNOSIS — M25512 Pain in left shoulder: Secondary | ICD-10-CM

## 2019-08-28 DIAGNOSIS — Z9079 Acquired absence of other genital organ(s): Secondary | ICD-10-CM | POA: Diagnosis not present

## 2019-08-28 DIAGNOSIS — R293 Abnormal posture: Secondary | ICD-10-CM

## 2019-08-28 DIAGNOSIS — M25511 Pain in right shoulder: Secondary | ICD-10-CM

## 2019-08-28 DIAGNOSIS — Z79899 Other long term (current) drug therapy: Secondary | ICD-10-CM

## 2019-08-28 DIAGNOSIS — M1990 Unspecified osteoarthritis, unspecified site: Secondary | ICD-10-CM | POA: Diagnosis not present

## 2019-08-28 DIAGNOSIS — Z8546 Personal history of malignant neoplasm of prostate: Secondary | ICD-10-CM | POA: Insufficient documentation

## 2019-08-28 DIAGNOSIS — Z87891 Personal history of nicotine dependence: Secondary | ICD-10-CM | POA: Insufficient documentation

## 2019-08-28 LAB — CMP (CANCER CENTER ONLY)
ALT: 12 U/L (ref 0–44)
AST: 16 U/L (ref 15–41)
Albumin: 4.2 g/dL (ref 3.5–5.0)
Alkaline Phosphatase: 51 U/L (ref 38–126)
Anion gap: 7 (ref 5–15)
BUN: 26 mg/dL — ABNORMAL HIGH (ref 8–23)
CO2: 30 mmol/L (ref 22–32)
Calcium: 9.4 mg/dL (ref 8.9–10.3)
Chloride: 105 mmol/L (ref 98–111)
Creatinine: 1.17 mg/dL (ref 0.61–1.24)
GFR, Est AFR Am: >60 mL/min (ref >60)
GFR, Estimated: >60 mL/min (ref >60)
Glucose, Bld: 105 mg/dL — ABNORMAL HIGH (ref 70–99)
Potassium: 4.6 mmol/L (ref 3.5–5.1)
Sodium: 142 mmol/L (ref 135–145)
Total Bilirubin: 0.5 mg/dL (ref 0.3–1.2)
Total Protein: 6.8 g/dL (ref 6.5–8.1)

## 2019-08-28 LAB — CBC WITH DIFFERENTIAL (CANCER CENTER ONLY)
Abs Immature Granulocytes: 0.01 10*3/uL (ref 0.00–0.07)
Basophils Absolute: 0 10*3/uL (ref 0.0–0.1)
Basophils Relative: 1 %
Eosinophils Absolute: 0.2 10*3/uL (ref 0.0–0.5)
Eosinophils Relative: 4 %
HCT: 37.9 % — ABNORMAL LOW (ref 39.0–52.0)
Hemoglobin: 12.6 g/dL — ABNORMAL LOW (ref 13.0–17.0)
Immature Granulocytes: 0 %
Lymphocytes Relative: 35 %
Lymphs Abs: 1.8 10*3/uL (ref 0.7–4.0)
MCH: 28.8 pg (ref 26.0–34.0)
MCHC: 33.2 g/dL (ref 30.0–36.0)
MCV: 86.7 fL (ref 80.0–100.0)
Monocytes Absolute: 0.5 10*3/uL (ref 0.1–1.0)
Monocytes Relative: 11 %
Neutro Abs: 2.6 10*3/uL (ref 1.7–7.7)
Neutrophils Relative %: 49 %
Platelet Count: 220 10*3/uL (ref 150–400)
RBC: 4.37 MIL/uL (ref 4.22–5.81)
RDW: 12.5 % (ref 11.5–15.5)
WBC Count: 5.1 10*3/uL (ref 4.0–10.5)
nRBC: 0 % (ref 0.0–0.2)

## 2019-08-28 NOTE — Therapy (Signed)
[place], [place], [address] Phone: [phone]   Fax:  [phone]  Physical Therapy Treatment/Discharge  Patient Details  Name: Randall Bradley MRN: [id number] Date of Birth: Nov 23, 1955 Referring Provider (PT): [name], MD   Encounter Date: 08/28/2019  PT End of Session - 08/28/19 1002    Visit Number  9    Authorization Type  BCBS    PT Start Time  [phone]   pt arrived late   PT Stop Time  1002    PT Time Calculation (min)  25 min    Activity Tolerance  Patient tolerated treatment well    Behavior During Therapy  [facility] for tasks assessed/performed       Past Medical History:  Diagnosis Date  . Alopecia   . Arthritis    "knees"  . Hematuria   . Hemochromatosis   . Hereditary hemochromatosis ([place]) monitored by PCP  dr [name] gates   history of phlebotomies -- last one 2014 (pt states body has normalized)  . History of bladder stone   . Hypertension   . Kidney stones   . Prostate cancer ([place])    UROLOGIST-- DR Diona Fanti; S/P PROSTATECTOMY 03-17-2008  . Right ureteral stone   . Rosacea     Past Surgical History:  Procedure Laterality Date  . APPENDECTOMY  1963  . COLONOSCOPY    . COMPLEX WOUND CLOSURE Left 03-26-2009   INDEX FINGER COMPLEX WOUND LACERATION REPAIR  . CYSTOLITHOLAPAXY AND URETHRAL DILATION  10-05-2009  . CYSTOSCOPY W/ RETROGRADES Right 10/11/2014   Procedure: CYSTOSCOPY WITH RETROGRADE PYELOGRAM;  Surgeon: Franchot Gallo, MD;  Location: [facility];  Service: Urology;  Laterality: Right;  . HOLMIUM LASER APPLICATION N/A 9/48/5462   Procedure: HOLMIUM LASER LITHOTRIPSY, ;  Surgeon: Franchot Gallo, MD;  Location: [facility];  Service: Urology;  Laterality: N/A;  . JOINT REPLACEMENT    . KNEE ARTHROSCOPY Left 1995  . LIPOMA EXCISION  03-04-2000   forehead  . ROBOT ASSISTED LAPAROSCOPIC RADICAL PROSTATECTOMY  03-17-2008   w/  BILATERAL  PELVIC LYMPHADENECTOMY (NON-NERVE SPARING)  . TOTAL KNEE ARTHROPLASTY Left 04/27/2015   Procedure: TOTAL KNEE ARTHROPLASTY;  Surgeon: [name], MD;  Location: [place];  Service: Orthopedics;  Laterality: Left;  . TOTAL KNEE ARTHROPLASTY Right 06/08/2015  . TOTAL KNEE ARTHROPLASTY Right 06/08/2015   Procedure: TOTAL RIGHT KNEE ARTHROPLASTY;  Surgeon: [name], MD;  Location: [place];  Service: Orthopedics;  Laterality: Right;    There were no vitals filed for this visit.  Subjective Assessment - 08/28/19 0939    Subjective  My shoulder and arms feel amost normal. Moved to doing yard work now that the house is finished. Back hurts a little more with yard work. Bw 4-5AM Right arm feels dead.    Currently in Pain?  No/denies         [facility] PT Assessment - 08/28/19 0001      Assessment   Medical Diagnosis  bil shoulder pain    Referring Provider (PT)  [name], MD      AROM   Overall AROM Comments  [facility] all planes       Strength   Overall Strength Comments  gross 5/5                           PT Education - 08/28/19 1003    Education Details  goals, long term  care, strengthening & aging, theracane, trigger points    Person(s) Educated  Patient    Methods  Explanation    Comprehension  Verbalized understanding          PT Long Term Goals - 08/28/19 0944      PT LONG TERM GOAL #1   Title  GHJ ER & flx strength to 5/5    Baseline  gross 5/5    Status  Achieved      PT LONG TERM GOAL #2   Title  Pt will perform full GHJ AROM without impingement    Status  Achieved      PT LONG TERM GOAL #3   Title  Pt will demo proper resting posture    Status  Achieved      PT LONG TERM GOAL #4   Title  pt will be able to lift, reach and pull without limitation by shoulder pain    Status  Achieved            Plan - 08/28/19 1003    Clinical Impression Statement  Pt has met all of his goals at this time and is prepared for d/c to independent  program. he has done very well becoming more aware of when limitaitons arise and how to correct them early. We discussed how correcting early he will avoid reaching point of damage so he can be less fearful.    PT Treatment/Interventions  ADLs/Self Care Home Management;Cryotherapy;Electrical Stimulation;Iontophoresis 52m/ml Dexamethasone;Moist Heat;Traction;Ultrasound;Therapeutic exercise;Therapeutic activities;Functional mobility training;Neuromuscular re-education;Patient/family education;Dry needling;Passive range of motion;Manual techniques;Taping;Spinal Manipulations;Joint Manipulations    PT Home Exercise Plan  RLHHDCPZ  pec stretch- door, hooklying, behind back; row, lawn mower, open book, prone IYT & ER    Consulted and Agree with Plan of Care  Patient       Patient will benefit from skilled therapeutic intervention in order to improve the following deficits and impairments:  Increased muscle spasms, Decreased activity tolerance, Pain, Improper body mechanics, Impaired flexibility, Decreased strength, Postural dysfunction  Visit Diagnosis: Right shoulder pain, unspecified chronicity  Left shoulder pain, unspecified chronicity  Abnormal posture     Problem List Patient Active Problem List   Diagnosis Date Noted  . Acute upper respiratory infection 05/29/2018  . Allergic rhinitis 05/29/2018  . Anorexia 05/29/2018  . Artificial knee joint present 05/29/2018  . Asthma without status asthmaticus 05/29/2018  . Cough 05/29/2018  . Disorder of iron metabolism 05/29/2018  . Encounter for general adult medical examination without abnormal findings 05/29/2018  . Fatigue 05/29/2018  . History of colon polyps 05/29/2018  . Inguinal hernia without obstruction or gangrene 05/29/2018  . Insomnia due to medical condition 05/29/2018  . Malignant neoplasm of prostate ([place] 05/29/2018  . Osteoarthrosis involving more than one site but not generalized 05/29/2018  . Other long term (current)  drug therapy 05/29/2018  . Other specified extrapyramidal and movement disorders 05/29/2018  . Pain in right knee 05/29/2018  . Pure hypercholesterolemia 05/29/2018  . Dextroscoliosis 08/01/2015  . Essential hypertension 06/30/2015  . Family history of heart disease 06/30/2015  . Left ventricular hypertrophy due to hypertensive disease 06/30/2015  . Left ventricular diastolic dysfunction, NYHA class 1 06/30/2015  . Fast heart beat 05/11/2015  . DJD (degenerative joint disease) of knee 04/27/2015   PHYSICAL THERAPY DISCHARGE SUMMARY  Visits from Start of Care: 9   Current functional level related to goals / functional outcomes: See above   Remaining deficits: See above   Education / Equipment:a Anatomy of  condition, POC, HEP, exercise form/rationale  Plan: Patient agrees to discharge.  Patient goals were met. Patient is being discharged due to meeting the stated rehab goals.  ?????     Jessica C. Hightower PT, DPT 08/28/19 11:03 AM   [place] [facility] [address] [place], [place], [address] Phone: [phone]   Fax:  [phone]  Name: Randall Bradley MRN: [id number] Date of Birth: [birth year]

## 2019-08-28 NOTE — Progress Notes (Signed)
Referral MD  Reason for Referral: Hemochromatosis  Chief Complaint  Patient presents with  . New Patient (Initial Visit)  : My doctor wants a hematologist to manage by hemochromatosis.  HPI: Mr. Fennell is a very nice 64 year old white male.  He is originally from [place], [place].  As such we had a lot to talk about.  He has been very interesting to talk to.  He has done a lot of different jobs.  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  He has known about hemochromatosis for many years.  He says that it is in his I think father side of the family.  I think a couple brothers had hemochromatosis.  Amazingly, he said that his PSA was 1500.  He has had all took was a prostatectomy with lymph node removal to get his PSA down to 0.  He is followed yearly by urology.  Apparently, he said that he was found to be "orange" and he has some neighbors who were doctors, try to figure out what was wrong.  Apparently, this is how the hemochromatosis was identified.  He is followed by Dr. Inda Merlin.  As always, Dr. Inda Merlin does a very thorough job.  Dr. Inda Merlin has managed the hemochromatosis.  Mr. Delatte has been giving blood donations to the [employer].  He said his last donation was I think a week or so ago.  He has had no problems with smoking.  He did smoke a long time ago.  He rarely has an alcoholic beverage.  He is active.  He likes to exercise.  He has 2 standard poodle that he walks.  He has had no problems with his thyroid.  He has had no problems with skin rashes.  He is fair skinned.  He has had no issues with skin cancer.  There is no headache.  He has had no visual changes.  Overall, his performance status is ECOG 0.   Past Medical History:  Diagnosis Date  . Alopecia   . Arthritis    "knees"  . Hematuria   . Hemochromatosis   . Hereditary hemochromatosis ([place]) monitored by PCP  dr [name] gates   history of phlebotomies -- last one 2014  (pt states body has normalized)  . History of bladder stone   . Hypertension   . Kidney stones   . Prostate cancer ([place])    UROLOGIST-- DR Diona Fanti; S/P PROSTATECTOMY 03-17-2008  . Right ureteral stone   . Rosacea   :  Past Surgical History:  Procedure Laterality Date  . APPENDECTOMY  1963  . COLONOSCOPY    . COMPLEX WOUND CLOSURE Left 03-26-2009   INDEX FINGER COMPLEX WOUND LACERATION REPAIR  . CYSTOLITHOLAPAXY AND URETHRAL DILATION  10-05-2009  . CYSTOSCOPY W/ RETROGRADES Right 10/11/2014   Procedure: CYSTOSCOPY WITH RETROGRADE PYELOGRAM;  Surgeon: Franchot Gallo, MD;  Location: [facility];  Service: Urology;  Laterality: Right;  . HOLMIUM LASER APPLICATION N/A 0000000   Procedure: HOLMIUM LASER LITHOTRIPSY, ;  Surgeon: Franchot Gallo, MD;  Location: [facility];  Service: Urology;  Laterality: N/A;  . JOINT REPLACEMENT    . KNEE ARTHROSCOPY Left 1995  . LIPOMA EXCISION  03-04-2000   forehead  . ROBOT ASSISTED LAPAROSCOPIC RADICAL PROSTATECTOMY  03-17-2008   w/  BILATERAL PELVIC LYMPHADENECTOMY (NON-NERVE SPARING)  . TOTAL KNEE ARTHROPLASTY Left 04/27/2015   Procedure: TOTAL KNEE ARTHROPLASTY;  Surgeon: [name], MD;  Location: [facility]  OR;  Service: Orthopedics;  Laterality: Left;  . TOTAL KNEE ARTHROPLASTY Right 06/08/2015  . TOTAL KNEE ARTHROPLASTY Right 06/08/2015   Procedure: TOTAL RIGHT KNEE ARTHROPLASTY;  Surgeon: [name], MD;  Location: [place];  Service: Orthopedics;  Laterality: Right;  :   Current Outpatient Medications:  .  aspirin EC 81 MG tablet, Take 81 mg by mouth daily., Disp: , Rfl:  .  ampicillin (PRINCIPEN) 500 MG capsule, Take 500 mg by mouth 2 (two) times daily., Disp: , Rfl:  .  cyproheptadine (PERIACTIN) 4 MG tablet, Take 4 mg by mouth 2 (two) times daily as needed for allergies., Disp: , Rfl:  .  finasteride (PROPECIA) 1 MG tablet, Take 1 mg by mouth daily., Disp: , Rfl:  .  losartan (COZAAR) 50 MG tablet,  Take 1.5 tablets (75 mg total) by mouth daily. Please make annual appt with Dr. Claiborne Billings for refills. [phone]. 1st attempt., Disp: 45 tablet, Rfl: 0 .  Multiple Vitamin (MULTIVITAMIN WITH MINERALS) TABS tablet, Take 1 tablet by mouth daily. Reported on 06/28/2015, Disp: , Rfl:  .  Naproxen Sodium (ALEVE PO), Take 1 tablet by mouth daily., Disp: , Rfl: :  :  Allergies  Allergen Reactions  . Ropinirole Hcl Other (See Comments)    diffuse pruritus  :  History reviewed. No pertinent family history.:  Social History   Socioeconomic History  . Marital status: Married    Spouse name: Not on file  . Number of children: Not on file  . Years of education: Not on file  . Highest education level: Not on file  Occupational History  . Not on file  Tobacco Use  . Smoking status: Former Smoker    Packs/day: 0.50    Years: 15.00    Pack years: 7.50    Types: Cigarettes    Quit date: 10/07/1993    Years since quitting: 25.9  . Smokeless tobacco: Never Used  Substance and Sexual Activity  . Alcohol use: Yes    Comment: "I quit drinking in ~ 2004"  . Drug use: No  . Sexual activity: Never  Other Topics Concern  . Not on file  Social History Narrative  . Not on file   Social Determinants of Health   Financial Resource Strain:   . Difficulty of Paying Living Expenses: Not on file  Food Insecurity:   . Worried About Charity fundraiser in the Last Year: Not on file  . Ran Out of Food in the Last Year: Not on file  Transportation Needs:   . Lack of Transportation (Medical): Not on file  . Lack of Transportation (Non-Medical): Not on file  Physical Activity:   . Days of Exercise per Week: Not on file  . Minutes of Exercise per Session: Not on file  Stress:   . Feeling of Stress : Not on file  Social Connections:   . Frequency of Communication with Friends and Family: Not on file  . Frequency of Social Gatherings with Friends and Family: Not on file  . Attends Religious Services:  Not on file  . Active Member of Clubs or Organizations: Not on file  . Attends Archivist Meetings: Not on file  . Marital Status: Not on file  Intimate Partner Violence:   . Fear of Current or Ex-Partner: Not on file  . Emotionally Abused: Not on file  . Physically Abused: Not on file  . Sexually Abused: Not on file  :  Review of Systems  Constitutional: Negative.   HENT: Negative.   Eyes: Negative.   Respiratory: Negative.   Cardiovascular: Negative.   Gastrointestinal: Negative.   Genitourinary: Negative.   Musculoskeletal: Negative.   Skin: Negative.   Neurological: Negative.   Endo/Heme/Allergies: Negative.   Psychiatric/Behavioral: Negative.      Exam: Well-developed and well-nourished white male in no obvious distress.  Vital signs show temperature of 97.1.  Pulse 94.  Blood pressure 112/84.  Weight is 196 pounds.  Head neck exam shows no scleral icterus.  He has no ocular or oral lesions.  He has no adenopathy in the neck.  Thyroid is nonpalpable.  Lungs are clear bilaterally.  Cardiac exam regular rate and rhythm with no murmurs, rubs or bruits.  Abdomen is soft.  He has good bowel sounds.  There is no fluid wave.  There is no palpable liver or spleen tip.  Back exam shows no tenderness over the spine, ribs or hips.  Extremities shows no clubbing, cyanosis or edema.  He has good range of motion of his joints.  Skin exam shows no rashes, ecchymoses or petechia.  Neurological exam shows no focal neurological deficits.    @IPVITALS @   Recent Labs    08/28/19 1515  WBC 5.1  HGB 12.6*  HCT 37.9*  PLT 220   Recent Labs    08/28/19 1515  NA 142  K 4.6  CL 105  CO2 30  GLUCOSE 105*  BUN 26*  CREATININE 1.17  CALCIUM 9.4    Blood smear review: None  Pathology: None    Assessment and Plan: Mr. Sardo is a very nice 64 year old Caucasian male.  He has hemochromatosis.  I am sending off a genetic assays so we can see what his mutations are.  I  would think that by his labs, that his iron studies should not be all that bad.  Hopefully, we can have a fairly straightforward management plan for him.  I probably would make sure that his ferritin is below 50 and his iron saturation is below 30%.  He says he does well with his hemoglobin between 12-14.  I am absolutely amazed about the whole prostate cancer.  Again I am amazed that he had a PSA incredibly high but yet only required a prostatectomy to normalize it.  He has never been on antiandrogen therapy.  We will see what his iron studies show.  This will dictate when we get him back here.  I spent about an hour with him.  Again he was really nice to talk to.  He is done so many different things in his life.  He is a very Renaissance kind of man.  I answered all of his questions.

## 2019-08-29 LAB — AFP TUMOR MARKER: AFP, Serum, Tumor Marker: 1.7 ng/mL (ref 0.0–8.3)

## 2019-08-31 ENCOUNTER — Telehealth: Payer: Self-pay | Admitting: *Deleted

## 2019-08-31 LAB — IRON AND TIBC
Iron: 106 ug/dL (ref 42–163)
Saturation Ratios: 42 % (ref 20–55)
TIBC: 254 ug/dL (ref 202–409)
UIBC: 147 ug/dL (ref 117–376)

## 2019-08-31 LAB — FERRITIN: Ferritin: 32 ng/mL (ref 24–336)

## 2019-08-31 NOTE — Telephone Encounter (Signed)
As noted below by Dr. Marin Olp, I informed the patient of his iron levels. The Ferritin in 32 and iron saturation is 42%. Another blood donation will help with this. He verbalized understanding.

## 2019-08-31 NOTE — Telephone Encounter (Signed)
-----   Message from [name], MD sent at 08/31/2019 11:43 AM EST ----- Call - the iron levels are fantastic!!  The ferritin is only 32!!  Iron saturation is 42%.  Another blood donation will help with this!!  Laurey Arrow

## 2019-09-01 LAB — HEMOCHROMATOSIS DNA-PCR(C282Y,H63D)

## 2019-09-04 ENCOUNTER — Other Ambulatory Visit: Payer: Self-pay | Admitting: Internal Medicine

## 2019-09-04 ENCOUNTER — Ambulatory Visit
Admission: RE | Admit: 2019-09-04 | Discharge: 2019-09-04 | Disposition: A | Discharge status: Home / Self Care | Payer: BC Managed Care – PPO | Source: Ambulatory Visit | Attending: Internal Medicine | Admitting: Internal Medicine

## 2019-09-04 DIAGNOSIS — M5412 Radiculopathy, cervical region: Secondary | ICD-10-CM

## 2019-09-08 ENCOUNTER — Other Ambulatory Visit: Payer: Self-pay | Admitting: Internal Medicine

## 2019-09-08 DIAGNOSIS — M5412 Radiculopathy, cervical region: Secondary | ICD-10-CM

## 2019-09-21 ENCOUNTER — Ambulatory Visit
Admission: RE | Admit: 2019-09-21 | Discharge: 2019-09-21 | Disposition: A | Discharge status: Home / Self Care | Payer: BC Managed Care – PPO | Source: Ambulatory Visit | Attending: Internal Medicine | Admitting: Internal Medicine

## 2019-09-21 ENCOUNTER — Other Ambulatory Visit: Payer: Self-pay

## 2019-09-21 DIAGNOSIS — M5412 Radiculopathy, cervical region: Secondary | ICD-10-CM

## 2019-09-26 ENCOUNTER — Other Ambulatory Visit: Payer: Self-pay | Admitting: Cardiovascular Disease

## 2019-10-23 ENCOUNTER — Other Ambulatory Visit: Payer: Self-pay | Admitting: Cardiovascular Disease

## 2019-10-30 ENCOUNTER — Encounter: Payer: Self-pay | Admitting: Physical Therapy

## 2019-10-30 ENCOUNTER — Other Ambulatory Visit: Payer: Self-pay

## 2019-10-30 ENCOUNTER — Ambulatory Visit: Payer: BLUE CROSS/BLUE SHIELD | Attending: Internal Medicine | Admitting: Physical Therapy

## 2019-10-30 DIAGNOSIS — G8929 Other chronic pain: Secondary | ICD-10-CM | POA: Insufficient documentation

## 2019-10-30 DIAGNOSIS — M545 Low back pain, unspecified: Secondary | ICD-10-CM

## 2019-10-30 DIAGNOSIS — M25551 Pain in right hip: Secondary | ICD-10-CM

## 2019-10-30 NOTE — Therapy (Signed)
[place], [place], [address] Phone: [phone]   Fax:  [phone]  Physical Therapy Evaluation  Patient Details  Name: Randall Bradley MRN: [id number] Date of Birth: December 16, 1955 Referring Provider (PT): [name], MD   Encounter Date: 10/30/2019  PT End of Session - 10/30/19 0845    Visit Number  1    Number of Visits  13    Date for PT Re-Evaluation  01/22/20    Authorization Type  BCBS    PT Start Time  0845    PT Stop Time  0930    PT Time Calculation (min)  45 min    Activity Tolerance  Patient tolerated treatment well    Behavior During Therapy  [facility] for tasks assessed/performed       Past Medical History:  Diagnosis Date  . Alopecia   . Arthritis    "knees"  . Hematuria   . Hemochromatosis   . Hereditary hemochromatosis ([place]) monitored by PCP  dr [name] gates   history of phlebotomies -- last one 2014 (pt states body has normalized)  . History of bladder stone   . Hypertension   . Kidney stones   . Prostate cancer ([place])    UROLOGIST-- DR Diona Fanti; S/P PROSTATECTOMY 03-17-2008  . Right ureteral stone   . Rosacea     Past Surgical History:  Procedure Laterality Date  . APPENDECTOMY  1963  . COLONOSCOPY    . COMPLEX WOUND CLOSURE Left 03-26-2009   INDEX FINGER COMPLEX WOUND LACERATION REPAIR  . CYSTOLITHOLAPAXY AND URETHRAL DILATION  10-05-2009  . CYSTOSCOPY W/ RETROGRADES Right 10/11/2014   Procedure: CYSTOSCOPY WITH RETROGRADE PYELOGRAM;  Surgeon: Franchot Gallo, MD;  Location: [facility];  Service: Urology;  Laterality: Right;  . HOLMIUM LASER APPLICATION N/A 0000000   Procedure: HOLMIUM LASER LITHOTRIPSY, ;  Surgeon: Franchot Gallo, MD;  [facility];  Service: Urology;  Laterality: N/A;  . JOINT REPLACEMENT    . KNEE ARTHROSCOPY Left 1995  . LIPOMA EXCISION  03-04-2000   forehead  . ROBOT ASSISTED LAPAROSCOPIC RADICAL  PROSTATECTOMY  03-17-2008   w/  BILATERAL PELVIC LYMPHADENECTOMY (NON-NERVE SPARING)  . TOTAL KNEE ARTHROPLASTY Left 04/27/2015   Procedure: TOTAL KNEE ARTHROPLASTY;  Surgeon: [name], MD;  Location: [place];  Service: Orthopedics;  Laterality: Left;  . TOTAL KNEE ARTHROPLASTY Right 06/08/2015  . TOTAL KNEE ARTHROPLASTY Right 06/08/2015   Procedure: TOTAL RIGHT KNEE ARTHROPLASTY;  Surgeon: [name], MD;  Location: [place];  Service: Orthopedics;  Laterality: Right;    There were no vitals filed for this visit.   Subjective Assessment - 10/30/19 0849    Subjective  "I came in the first time for a chronic lower back issue. I think the house renovations have aggravated the problem. I have been doing my exercises to keep my symptoms less intense. I am now having pain in my right hip. I have been in a lot of pain, and have sleep disturbances. He denies N/T. He reports that pain doesn't stop him, but slows him down."    Limitations  Sitting;Standing;Walking;House hold activities    Patient Stated Goals  Decrease pain    Currently in Pain?  Yes    Pain Score  1     Pain Location  Back    Pain Orientation  Right;Left;Anterior    Pain Descriptors / Indicators  Discomfort;Tightness    Pain Type  Chronic pain  Pain Onset  More than a month ago    Pain Frequency  Intermittent    Aggravating Factors   Bending, lifting    Pain Relieving Factors  Rest    Effect of Pain on Daily Activities  House hold chores/activities         [facility] PT Assessment - 10/30/19 0001      Assessment   Medical Diagnosis  Lower back pain     Referring Provider (PT)  [name], MD    Hand Dominance  Right    Prior Therapy  Yes       Precautions   Precautions  None      Restrictions   Weight Bearing Restrictions  No      Balance Screen   Has the patient fallen in the past 6 months  No    Has the patient had a decrease in activity level because of a fear of falling?   No    Is the patient  reluctant to leave their home because of a fear of falling?   No      Home Film/video editor residence    Living Arrangements  Alone      Prior Function   Level of [place]  Retired    Leisure  walking       Cognition   Overall Cognitive Status  Within Functional Limits for tasks assessed    Attention  Focused    Focused Attention  Appears intact    Memory  Appears intact    Awareness  Appears intact    Problem Solving  Appears intact      Observation/Other Assessments   Focus on Therapeutic Outcomes (FOTO)   33% limited    Predicted 21% limitation      Posture/Postural Control   Posture/Postural Control  Postural limitations    Postural Limitations  Rounded Shoulders;Forward head;Decreased lumbar lordosis      AROM   Overall AROM Comments  Lt. SB limited   Patient reports tightness on the rt. side      Strength   Strength Assessment Site  Hip    Right/Left Hip  Right;Left    Right Hip Flexion  5/5    Right Hip Extension  4/5    Right Hip ABduction  5/5    Left Hip Flexion  5/5    Left Hip Extension  3-/5    Left Hip ABduction  5/5                Objective measurements completed on examination: See above findings.      [facility] Adult PT Treatment/Exercise - 10/30/19 0001      Exercises   Exercises  Knee/Hip;Lumbar      Lumbar Exercises: Stretches   Passive Hamstring Stretch  2 reps;Right;Left;30 seconds    Figure 4 Stretch  2 reps;30 seconds;Supine      Lumbar Exercises: Quadruped   Other Quadruped Lumbar Exercises  Bird dog, 1 set of 10     Other Quadruped Lumbar Exercises  Childs Pose       Manual Therapy   Manual Therapy  Soft tissue mobilization    Manual therapy comments  skilled palpation and monitoring during TPDN    Soft tissue mobilization  Lumbar region, paraspinals, and glutes        Trigger Point Dry Needling - 10/30/19 0001    Muscles Treated Back/Hip  Gluteus medius;Gluteus  maximus  Gluteus Medius Response  Twitch response elicited;Palpable increased muscle length    Gluteus Maximus Response  Twitch response elicited;Palpable increased muscle length           PT Education - 10/30/19 1032    Education Details  Patient educated on new HEP, muscle tightness, and importance of stretching    Person(s) Educated  Patient    Methods  Explanation;Handout;Demonstration    Comprehension  Verbalized understanding       PT Short Term Goals - 10/30/19 1116      PT SHORT TERM GOAL #1   Title  Patient will be independent with initial HEP    Baseline  HEP provided 10/30/2019    Time  6    Period  Weeks    Status  New    Target Date  12/11/19      PT SHORT TERM GOAL #2   Title  Pt will demonstrate and verbalize knowledge of proper postural alignment    Baseline  rounded shoulders, forward head, can correct with cueing    Time  6    Period  Weeks    Status  New    Target Date  12/11/19        PT Long Term Goals - 10/30/19 1118      PT LONG TERM GOAL #1   Title  Patient will be able to achieve  5/5 gross LE strength    Baseline  --    Time  12    Period  Weeks    Status  New    Target Date  01/22/20      PT LONG TERM GOAL #2   Title  Patient will demonstrate bilateral symmetry with lumbar side bending in order to demonstrate a decrease in myofascial restrictions on the right side.    Baseline  Patient limited with left side bending    Time  12    Period  Weeks    Status  New    Target Date  01/22/20      PT LONG TERM GOAL #3   Title  Patient will report a >/= 25% decrease in sleep disturbances in order to improve quality of life    Baseline  Patient reports frequent sleep disturbances    Time  12    Period  Weeks    Status  New    Target Date  01/22/20      PT LONG TERM GOAL #4   Title  Patient will report a max 3/10 pain for >/= 1 week so that he is able to complete home renovations    Baseline  Patient reports that pain slows him down     Time  12    Period  Weeks    Status  New    Target Date  01/22/20             Plan - 10/30/19 0933    Clinical Impression Statement  Randall Bradley presents to the clinic with lower back and Rt. hip pain. He reports that he has been doing his exercises, but house renovations have been aggravating his back. Upon further assessment his iliac crests are even. Patient did report relief with LAD. His hamstrings and Rt. paraspinals were very tight. We performed some stretches and functional squats to lengthen both back and hip muscles. TPDN was performed over the glute region. He has a good amount of hip strength but was limited with left hip extension, due to reported rt. hip pain.  He was educated on new HEP and POC. Patient would benefit from PT to address pain and strength deficits.    Personal Factors and Comorbidities  Comorbidity 2    Comorbidities  Arthritis, Hx of HTN    Examination-Activity Limitations  Bed Mobility;Reach Overhead;Carry;Sleep;Dressing;Lift    Examination-Participation Restrictions  Cleaning;Meal Prep;Yard Work    Stability/Clinical Decision Making  Stable/Uncomplicated    Designer, jewellery  Low    Rehab Potential  Excellent    PT Frequency  1x / week    PT Duration  12 weeks    PT Treatment/Interventions  ADLs/Self Care Home Management;Cryotherapy;Electrical Stimulation;Iontophoresis 4mg /ml Dexamethasone;Moist Heat;Traction;Ultrasound;Therapeutic exercise;Therapeutic activities;Functional mobility training;Neuromuscular re-education;Patient/family education;Dry needling;Passive range of motion;Manual techniques;Taping;Spinal Manipulations;Joint Manipulations    PT Next Visit Plan  Hamstring/glute stretches, Core strengthening, TPDN PRN    PT Home Exercise Plan  [id number]    Consulted and Agree with Plan of Care  Patient       Patient will benefit from skilled therapeutic intervention in order to improve the following deficits and impairments:  Increased muscle  spasms, Decreased activity tolerance, Pain, Improper body mechanics, Impaired flexibility, Decreased strength, Postural dysfunction, Decreased endurance, Decreased mobility, Decreased range of motion, Increased fascial restricitons  Visit Diagnosis: Chronic bilateral low back pain without sciatica  Pain in right hip     Problem List Patient Active Problem List   Diagnosis Date Noted  . Acute upper respiratory infection 05/29/2018  . Allergic rhinitis 05/29/2018  . Anorexia 05/29/2018  . Artificial knee joint present 05/29/2018  . Asthma without status asthmaticus 05/29/2018  . Cough 05/29/2018  . Disorder of iron metabolism 05/29/2018  . Encounter for general adult medical examination without abnormal findings 05/29/2018  . Fatigue 05/29/2018  . History of colon polyps 05/29/2018  . Inguinal hernia without obstruction or gangrene 05/29/2018  . Insomnia due to medical condition 05/29/2018  . Malignant neoplasm of prostate ([place]) 05/29/2018  . Osteoarthrosis involving more than one site but not generalized 05/29/2018  . Other long term (current) drug therapy 05/29/2018  . Other specified extrapyramidal and movement disorders 05/29/2018  . Pain in right knee 05/29/2018  . Pure hypercholesterolemia 05/29/2018  . Dextroscoliosis 08/01/2015  . Essential hypertension 06/30/2015  . Family history of heart disease 06/30/2015  . Left ventricular hypertrophy due to hypertensive disease 06/30/2015  . Left ventricular diastolic dysfunction, NYHA class 1 06/30/2015  . Fast heart beat 05/11/2015  . DJD (degenerative joint disease) of knee 04/27/2015    Laveda Norman, SPT 10/30/2019, 12:50 PM  [facility] [address] [place], [place], [address] Phone: [phone]   Fax:  [phone]  Name: Randall Bradley MRN: [id number] Date of Birth: 10-16-1955

## 2019-11-08 ENCOUNTER — Other Ambulatory Visit: Payer: Self-pay | Admitting: Cardiovascular Disease

## 2019-11-10 ENCOUNTER — Ambulatory Visit: Payer: BLUE CROSS/BLUE SHIELD | Admitting: Physical Therapy

## 2019-11-11 ENCOUNTER — Ambulatory Visit: Payer: BLUE CROSS/BLUE SHIELD | Admitting: Physical Therapy

## 2019-11-11 ENCOUNTER — Other Ambulatory Visit: Payer: Self-pay

## 2019-11-11 DIAGNOSIS — M545 Low back pain, unspecified: Secondary | ICD-10-CM

## 2019-11-11 DIAGNOSIS — G8929 Other chronic pain: Secondary | ICD-10-CM

## 2019-11-11 NOTE — Therapy (Signed)
[place], [place], [address] Phone: [phone]   Fax:  [phone]  Physical Therapy Treatment  Patient Details  Name: Randall Bradley MRN: [id number] Date of Birth: 1955-09-09 Referring Provider (PT): [name], MD   Encounter Date: 11/11/2019  PT End of Session - 11/11/19 1536    Visit Number  2    Number of Visits  13    Date for PT Re-Evaluation  01/22/20    Authorization Type  BCBS    PT Start Time  1500    PT Stop Time  1532    PT Time Calculation (min)  32 min    Activity Tolerance  Patient tolerated treatment well    Behavior During Therapy  [facility] for tasks assessed/performed       Past Medical History:  Diagnosis Date  . Alopecia   . Arthritis    "knees"  . Hematuria   . Hemochromatosis   . Hereditary hemochromatosis ([place]) monitored by PCP  dr [name] gates   history of phlebotomies -- last one 2014 (pt states body has normalized)  . History of bladder stone   . Hypertension   . Kidney stones   . Prostate cancer ([place])    UROLOGIST-- DR Diona Fanti; S/P PROSTATECTOMY 03-17-2008  . Right ureteral stone   . Rosacea     Past Surgical History:  Procedure Laterality Date  . APPENDECTOMY  1963  . COLONOSCOPY    . COMPLEX WOUND CLOSURE Left 03-26-2009   INDEX FINGER COMPLEX WOUND LACERATION REPAIR  . CYSTOLITHOLAPAXY AND URETHRAL DILATION  10-05-2009  . CYSTOSCOPY W/ RETROGRADES Right 10/11/2014   Procedure: CYSTOSCOPY WITH RETROGRADE PYELOGRAM;  Surgeon: Franchot Gallo, MD;  Location: [facility];  Service: Urology;  Laterality: Right;  . HOLMIUM LASER APPLICATION N/A 0000000   Procedure: HOLMIUM LASER LITHOTRIPSY, ;  Surgeon: Franchot Gallo, MD;  Location: [facility];  Service: Urology;  Laterality: N/A;  . JOINT REPLACEMENT    . KNEE ARTHROSCOPY Left 1995  . LIPOMA EXCISION  03-04-2000   forehead  . ROBOT ASSISTED LAPAROSCOPIC RADICAL  PROSTATECTOMY  03-17-2008   w/  BILATERAL PELVIC LYMPHADENECTOMY (NON-NERVE SPARING)  . TOTAL KNEE ARTHROPLASTY Left 04/27/2015   Procedure: TOTAL KNEE ARTHROPLASTY;  Surgeon: [name], MD;  Location: [place];  Service: Orthopedics;  Laterality: Left;  . TOTAL KNEE ARTHROPLASTY Right 06/08/2015  . TOTAL KNEE ARTHROPLASTY Right 06/08/2015   Procedure: TOTAL RIGHT KNEE ARTHROPLASTY;  Surgeon: [name], MD;  Location: [place];  Service: Orthopedics;  Laterality: Right;    There were no vitals filed for this visit.  Subjective Assessment - 11/11/19 1509    Subjective  I had a lift in every shoe but I went one day without it and that was the beginning of all this.    Currently in Pain?  Yes    Pain Location  Back    Pain Orientation  Right;Lower    Pain Descriptors / Indicators  Sore                        [facility] Adult PT Treatment/Exercise - 11/11/19 0001      Lumbar Exercises: Stretches   Other Lumbar Stretch Exercise  rt hip lean into wall      Lumbar Exercises: Standing   Other Standing Lumbar Exercises  postural alignment in mirror- weight shift, Lt oblique activation    Other Standing Lumbar Exercises  Lt UE adduction black Tband       Trigger Point Dry Needling - 11/11/19 0001    Muscles Treated Back/Hip  Tensor fascia lata;Piriformis    Gluteus Medius Response  Twitch response elicited;Palpable increased muscle length   Rt   Gluteus Maximus Response  Twitch response elicited;Palpable increased muscle length   Rt   Piriformis Response  Twitch response elicited;Palpable increased muscle length   Rt   Tensor Fascia Lata Response  Twitch response elicited;Palpable increased muscle length   Rt            PT Short Term Goals - 10/30/19 1116      PT SHORT TERM GOAL #1   Title  Patient will be independent with initial HEP    Baseline  HEP provided 10/30/2019    Time  6    Period  Weeks    Status  New    Target Date  12/11/19      PT SHORT  TERM GOAL #2   Title  Pt will demonstrate and verbalize knowledge of proper postural alignment    Baseline  rounded shoulders, forward head, can correct with cueing    Time  6    Period  Weeks    Status  New    Target Date  12/11/19        PT Long Term Goals - 10/30/19 1118      PT LONG TERM GOAL #1   Title  Patient will be able to achieve  5/5 gross LE strength    Baseline  --    Time  12    Period  Weeks    Status  New    Target Date  01/22/20      PT LONG TERM GOAL #2   Title  Patient will demonstrate bilateral symmetry with lumbar side bending in order to demonstrate a decrease in myofascial restrictions on the right side.    Baseline  Patient limited with left side bending    Time  12    Period  Weeks    Status  New    Target Date  01/22/20      PT LONG TERM GOAL #3   Title  Patient will report a >/= 25% decrease in sleep disturbances in order to improve quality of life    Baseline  Patient reports frequent sleep disturbances    Time  12    Period  Weeks    Status  New    Target Date  01/22/20      PT LONG TERM GOAL #4   Title  Patient will report a max 3/10 pain for >/= 1 week so that he is able to complete home renovations    Baseline  Patient reports that pain slows him down    Time  12    Period  Weeks    Status  New    Target Date  01/22/20            Plan - 11/11/19 1524    Clinical Impression Statement  Presented to clinic with Left lumbar and pelvic shift with pain in rt hip and low back region. Decreased shift following manual therapy and able to use muscular activation to find correct alignment in mirror. Is travelling to [place] so I asked him to do wall shift, mirror alignment and Lt UE adduction.    PT Treatment/Interventions  ADLs/Self Care Home Management;Cryotherapy;Electrical Stimulation;Iontophoresis 4mg /ml Dexamethasone;Moist Heat;Traction;Ultrasound;Therapeutic exercise;Therapeutic activities;Functional mobility training;Neuromuscular  re-education;Patient/family education;Dry needling;Passive  range of motion;Manual techniques;Taping;Spinal Manipulations;Joint Manipulations    PT Next Visit Plan  Hamstring/glute stretches, Core strengthening, TPDN PRN    PT Home Exercise Plan  [id number]    Consulted and Agree with Plan of Care  Patient       Patient will benefit from skilled therapeutic intervention in order to improve the following deficits and impairments:  Increased muscle spasms, Decreased activity tolerance, Pain, Improper body mechanics, Impaired flexibility, Decreased strength, Postural dysfunction, Decreased endurance, Decreased mobility, Decreased range of motion, Increased fascial restricitons  Visit Diagnosis: Chronic bilateral low back pain without sciatica     Problem List Patient Active Problem List   Diagnosis Date Noted  . Acute upper respiratory infection 05/29/2018  . Allergic rhinitis 05/29/2018  . Anorexia 05/29/2018  . Artificial knee joint present 05/29/2018  . Asthma without status asthmaticus 05/29/2018  . Cough 05/29/2018  . Disorder of iron metabolism 05/29/2018  . Encounter for general adult medical examination without abnormal findings 05/29/2018  . Fatigue 05/29/2018  . History of colon polyps 05/29/2018  . Inguinal hernia without obstruction or gangrene 05/29/2018  . Insomnia due to medical condition 05/29/2018  . Malignant neoplasm of prostate ([place]) 05/29/2018  . Osteoarthrosis involving more than one site but not generalized 05/29/2018  . Other long term (current) drug therapy 05/29/2018  . Other specified extrapyramidal and movement disorders 05/29/2018  . Pain in right knee 05/29/2018  . Pure hypercholesterolemia 05/29/2018  . Dextroscoliosis 08/01/2015  . Essential hypertension 06/30/2015  . Family history of heart disease 06/30/2015  . Left ventricular hypertrophy due to hypertensive disease 06/30/2015  . Left ventricular diastolic dysfunction, NYHA class 1 06/30/2015  .  Fast heart beat 05/11/2015  . DJD (degenerative joint disease) of knee 04/27/2015    Geffrey Michaelsen C. Nissa Stannard PT, DPT 11/11/19 3:38 PM   [facility] [address] [place], [place], [address] Phone: [phone]   Fax:  [phone]  Name: ISLAM DELLACROCE MRN: [id number] Date of Birth: 1955-08-19

## 2019-11-20 ENCOUNTER — Encounter: Payer: Self-pay | Admitting: Physical Therapy

## 2019-11-20 ENCOUNTER — Ambulatory Visit: Payer: BLUE CROSS/BLUE SHIELD | Admitting: Physical Therapy

## 2019-11-20 ENCOUNTER — Other Ambulatory Visit: Payer: Self-pay

## 2019-11-20 DIAGNOSIS — M545 Low back pain, unspecified: Secondary | ICD-10-CM

## 2019-11-20 DIAGNOSIS — G8929 Other chronic pain: Secondary | ICD-10-CM

## 2019-11-20 NOTE — Therapy (Signed)
[place], [place], [address] Phone: [phone]   Fax:  [phone]  Physical Therapy Treatment  Patient Details  Name: Randall Bradley MRN: [id number] Date of Birth: 12/09/1955 Referring Provider (PT): [name], MD   Encounter Date: 11/20/2019  PT End of Session - 11/20/19 0714    Visit Number  3    Number of Visits  13    Date for PT Re-Evaluation  01/22/20    Authorization Type  BCBS    PT Start Time  0714    PT Stop Time  0751    PT Time Calculation (min)  37 min    Activity Tolerance  Patient tolerated treatment well    Behavior During Therapy  [facility] for tasks assessed/performed       Past Medical History:  Diagnosis Date  . Alopecia   . Arthritis    "knees"  . Hematuria   . Hemochromatosis   . Hereditary hemochromatosis ([place]) monitored by PCP  dr [name] gates   history of phlebotomies -- last one 2014 (pt states body has normalized)  . History of bladder stone   . Hypertension   . Kidney stones   . Prostate cancer ([place])    UROLOGIST-- DR Diona Fanti; S/P PROSTATECTOMY 03-17-2008  . Right ureteral stone   . Rosacea     Past Surgical History:  Procedure Laterality Date  . APPENDECTOMY  1963  . COLONOSCOPY    . COMPLEX WOUND CLOSURE Left 03-26-2009   INDEX FINGER COMPLEX WOUND LACERATION REPAIR  . CYSTOLITHOLAPAXY AND URETHRAL DILATION  10-05-2009  . CYSTOSCOPY W/ RETROGRADES Right 10/11/2014   Procedure: CYSTOSCOPY WITH RETROGRADE PYELOGRAM;  Surgeon: Franchot Gallo, MD;  Location: [facility];  Service: Urology;  Laterality: Right;  . HOLMIUM LASER APPLICATION N/A 0000000   Procedure: HOLMIUM LASER LITHOTRIPSY, ;  Surgeon: Franchot Gallo, MD;  Location: [facility];  Service: Urology;  Laterality: N/A;  . JOINT REPLACEMENT    . KNEE ARTHROSCOPY Left 1995  . LIPOMA EXCISION  03-04-2000   forehead  . ROBOT ASSISTED LAPAROSCOPIC RADICAL  PROSTATECTOMY  03-17-2008   w/  BILATERAL PELVIC LYMPHADENECTOMY (NON-NERVE SPARING)  . TOTAL KNEE ARTHROPLASTY Left 04/27/2015   Procedure: TOTAL KNEE ARTHROPLASTY;  Surgeon: [name], MD;  Location: [place];  Service: Orthopedics;  Laterality: Left;  . TOTAL KNEE ARTHROPLASTY Right 06/08/2015  . TOTAL KNEE ARTHROPLASTY Right 06/08/2015   Procedure: TOTAL RIGHT KNEE ARTHROPLASTY;  Surgeon: [name], MD;  Location: [place];  Service: Orthopedics;  Laterality: Right;    There were no vitals filed for this visit.  Subjective Assessment - 11/20/19 0717    Subjective  Having burning in my right forearm and numbness in hand, trigger points in Rt periscapular region. low back is doing well with stretches, only sore when I do a lot of work.                        [facility] Adult PT Treatment/Exercise - 11/20/19 0001      Shoulder Exercises: Stretch   Other Shoulder Stretches  levator scap stretch    Other Shoulder Stretches  wrist flexion stretch      Manual Therapy   Soft tissue mobilization  Rt wrist extensor group, Rt levator scapula       Trigger Point Dry Needling - 11/20/19 0001    Dry Needling Comments  Rt wrist extensor group  PT Education - 11/20/19 0754    Education Details  anatomy of condition, bracing options    Person(s) Educated  Patient    Methods  Explanation    Comprehension  Verbalized understanding;Need further instruction       PT Short Term Goals - 10/30/19 1116      PT SHORT TERM GOAL #1   Title  Patient will be independent with initial HEP    Baseline  HEP provided 10/30/2019    Time  6    Period  Weeks    Status  New    Target Date  12/11/19      PT SHORT TERM GOAL #2   Title  Pt will demonstrate and verbalize knowledge of proper postural alignment    Baseline  rounded shoulders, forward head, can correct with cueing    Time  6    Period  Weeks    Status  New    Target Date  12/11/19        PT Long Term  Goals - 10/30/19 1118      PT LONG TERM GOAL #1   Title  Patient will be able to achieve  5/5 gross LE strength    Baseline  --    Time  12    Period  Weeks    Status  New    Target Date  01/22/20      PT LONG TERM GOAL #2   Title  Patient will demonstrate bilateral symmetry with lumbar side bending in order to demonstrate a decrease in myofascial restrictions on the right side.    Baseline  Patient limited with left side bending    Time  12    Period  Weeks    Status  New    Target Date  01/22/20      PT LONG TERM GOAL #3   Title  Patient will report a >/= 25% decrease in sleep disturbances in order to improve quality of life    Baseline  Patient reports frequent sleep disturbances    Time  12    Period  Weeks    Status  New    Target Date  01/22/20      PT LONG TERM GOAL #4   Title  Patient will report a max 3/10 pain for >/= 1 week so that he is able to complete home renovations    Baseline  Patient reports that pain slows him down    Time  12    Period  Weeks    Status  New    Target Date  01/22/20            Plan - 11/20/19 [id number]    Clinical Impression Statement  Back is doing very well, still noted mild shift but pt denies pain. Due to shift and long drive, Rt GHJ depression and Rt forearm have increase in tension along biomechanical chain. TPDN to wrist extensors resolved numbness and burning in hand and STM to Rt levator scap reduced proximal pain. Will obtain a tennis elbow brace and we discussed the possibility of using a wrist splint at night as he notices pain is less when his arm and wrist are extended.    PT Treatment/Interventions  ADLs/Self Care Home Management;Cryotherapy;Electrical Stimulation;Iontophoresis 4mg /ml Dexamethasone;Moist Heat;Traction;Ultrasound;Therapeutic exercise;Therapeutic activities;Functional mobility training;Neuromuscular re-education;Patient/family education;Dry needling;Passive range of motion;Manual techniques;Taping;Spinal  Manipulations;Joint Manipulations    PT Next Visit Plan  recheck Rt EU, progress LBP PRN, review goals.    PT Home  Exercise Plan  [id number]    Consulted and Agree with Plan of Care  Patient       Patient will benefit from skilled therapeutic intervention in order to improve the following deficits and impairments:  Increased muscle spasms, Decreased activity tolerance, Pain, Improper body mechanics, Impaired flexibility, Decreased strength, Postural dysfunction, Decreased endurance, Decreased mobility, Decreased range of motion, Increased fascial restricitons  Visit Diagnosis: Chronic bilateral low back pain without sciatica     Problem List Patient Active Problem List   Diagnosis Date Noted  . Acute upper respiratory infection 05/29/2018  . Allergic rhinitis 05/29/2018  . Anorexia 05/29/2018  . Artificial knee joint present 05/29/2018  . Asthma without status asthmaticus 05/29/2018  . Cough 05/29/2018  . Disorder of iron metabolism 05/29/2018  . Encounter for general adult medical examination without abnormal findings 05/29/2018  . Fatigue 05/29/2018  . History of colon polyps 05/29/2018  . Inguinal hernia without obstruction or gangrene 05/29/2018  . Insomnia due to medical condition 05/29/2018  . Malignant neoplasm of prostate ([place]) 05/29/2018  . Osteoarthrosis involving more than one site but not generalized 05/29/2018  . Other long term (current) drug therapy 05/29/2018  . Other specified extrapyramidal and movement disorders 05/29/2018  . Pain in right knee 05/29/2018  . Pure hypercholesterolemia 05/29/2018  . Dextroscoliosis 08/01/2015  . Essential hypertension 06/30/2015  . Family history of heart disease 06/30/2015  . Left ventricular hypertrophy due to hypertensive disease 06/30/2015  . Left ventricular diastolic dysfunction, NYHA class 1 06/30/2015  . Fast heart beat 05/11/2015  . DJD (degenerative joint disease) of knee 04/27/2015    Monie Shere C. Mahad Newstrom PT,  DPT 11/20/19 7:55 AM   [facility] [address] [place], [place], [address] Phone: [phone]   Fax:  [phone]  Name: Randall Bradley MRN: [id number] Date of Birth: 24-Aug-1955

## 2019-11-23 ENCOUNTER — Other Ambulatory Visit: Payer: Self-pay | Admitting: Cardiovascular Disease

## 2019-12-21 ENCOUNTER — Encounter: Payer: Self-pay | Admitting: Cardiovascular Disease

## 2019-12-21 ENCOUNTER — Other Ambulatory Visit: Payer: Self-pay

## 2019-12-21 ENCOUNTER — Ambulatory Visit (INDEPENDENT_AMBULATORY_CARE_PROVIDER_SITE_OTHER): Payer: BLUE CROSS/BLUE SHIELD | Admitting: Cardiovascular Disease

## 2019-12-21 DIAGNOSIS — E782 Mixed hyperlipidemia: Secondary | ICD-10-CM | POA: Diagnosis not present

## 2019-12-21 DIAGNOSIS — Z8546 Personal history of malignant neoplasm of prostate: Secondary | ICD-10-CM

## 2019-12-21 DIAGNOSIS — I119 Hypertensive heart disease without heart failure: Secondary | ICD-10-CM | POA: Diagnosis not present

## 2019-12-21 DIAGNOSIS — I10 Essential (primary) hypertension: Secondary | ICD-10-CM

## 2019-12-21 DIAGNOSIS — Z8249 Family history of ischemic heart disease and other diseases of the circulatory system: Secondary | ICD-10-CM

## 2019-12-21 NOTE — Progress Notes (Signed)
Patient ID: Randall Bradley, male   DOB: 1955/10/05, 64 y.o.   MRN: [id number]     Primary MD: Dr. Inda Merlin  PATIENT PROFILE: Randall Bradley is a 64 y.o. male who was initially self-referred due to a significant family history for CAD, and recently being told of having diastolic dysfunction.  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  He presents for a 13 month follow-up evaluation.  HPI:  Randall Bradley denies any known history of prior heart disease.  He has a history of hypertension for a ~4 years for which most recently he has been on losartan at 50 mg daily.  He also has a history of rosacea for which he takes minocycline 100 mg every evening.  He takes finasteride for hair loss.  In November 2016, he underwent initial left knee replacement surgery which he tolerated well.  However, he was told of having an increased heart rate in the postoperative period.  When I review the hospital records,  heart rates were in the 90s without any incidences of documented arrhythmia or significant ectopy.  He subsequently saw his primary physician, Dr. Mertha Finders, who referred him for an echo Doppler study which was done on 05/24/2015.  This revealed an ejection fraction of 55-60%.  There was evidence for mild left ventricular hypertrophy with normal systolic function.  Diastolic parameters suggested mild grade 1 diastolic dysfunction.  There was evidence for trivial mitral regurgitation, trivial tricuspid regurgitation and trivial pulmonic regurgitation.  PA pressure was normal.  On 06/08/2015 he underwent right total knee replacement and tolerated this well with no mention of cardiac arrhythmia in the postoperative period. He has a strong family history for heart disease with a father having suffered multiple myocardial infarctions and died at age 74.  He has a past medical history significant for radical prostatectomy in 2007 prostate cancer. He is followed by Dr. Buford Dresser for urologic care.  He also has a history  of hemochromatosis.   When I last saw him in February 2018.  He was remaining stable  from a cardiac standpoint.  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 He has several older cows for breeding.   I last saw him in March 2019 and over the previous year he had remained very active.  During that evaluation he denied anyhest pain, PND, orthopnea.  He denied palpitations.  Since his knee replacements he has no restrictions in his movement.  He denied any difficulty with sleep. He had the flu in December.  He works out at least 3 days per week.   Since I last saw him, he had gone to give blood donation in February 2020 and at that time he was told that he had tested positive with Covid antibodies.  As result in retrospect he seems he may have had Covid instead of the "flu" in December 2019 prior to its awareness in the [place].  Over the past several years, he ultimately had an offer and sold his house and farm where he raised [place] outside [place].  He has now moved back to [place].  He is recently been evaluated by Dr. Burney Gauze for hemochromatosis and has undergone genetic testing which is still pending.  Presently, he denies chest pain or shortness of breath.  He remains active doing yard work, walking, and biking.  He presents for evaluation.  Past Medical History:  Diagnosis Date  . Alopecia   . Arthritis    "knees"  . Hematuria   . Hemochromatosis   . Hereditary hemochromatosis ([place]) monitored by PCP  dr [name] gates   history of phlebotomies -- last one 2014 (pt states body has normalized)  . History of bladder stone   . Hypertension   . Kidney stones   . Prostate cancer ([place])    UROLOGIST-- DR Diona Fanti; S/P PROSTATECTOMY 03-17-2008  . Right ureteral stone   . Rosacea     Past Surgical History:   Procedure Laterality Date  . APPENDECTOMY  1963  . COLONOSCOPY    . COMPLEX WOUND CLOSURE Left 03-26-2009   INDEX FINGER COMPLEX WOUND LACERATION REPAIR  . CYSTOLITHOLAPAXY AND URETHRAL DILATION  10-05-2009  . CYSTOSCOPY W/ RETROGRADES Right 10/11/2014   Procedure: CYSTOSCOPY WITH RETROGRADE PYELOGRAM;  Surgeon: Franchot Gallo, MD;  Location: [facility];  Service: Urology;  Laterality: Right;  . HOLMIUM LASER APPLICATION N/A 8/67/6195   Procedure: HOLMIUM LASER LITHOTRIPSY, ;  Surgeon: Franchot Gallo, MD;  Location: [facility];  Service: Urology;  Laterality: N/A;  . JOINT REPLACEMENT    . KNEE ARTHROSCOPY Left 1995  . LIPOMA EXCISION  03-04-2000   forehead  . ROBOT ASSISTED LAPAROSCOPIC RADICAL PROSTATECTOMY  03-17-2008   w/  BILATERAL PELVIC LYMPHADENECTOMY (NON-NERVE SPARING)  . TOTAL KNEE ARTHROPLASTY Left 04/27/2015   Procedure: TOTAL KNEE ARTHROPLASTY;  Surgeon: [name], MD;  Location: [facility];  Service: Orthopedics;  Laterality: Left;  . TOTAL KNEE ARTHROPLASTY Right 06/08/2015  . TOTAL KNEE ARTHROPLASTY Right 06/08/2015   Procedure: TOTAL RIGHT KNEE ARTHROPLASTY;  Surgeon: [name], MD;  Location: [facility];  Service: Orthopedics;  Laterality: Right;    Allergies  Allergen Reactions  . Ropinirole Hcl Other (See Comments)    diffuse pruritus    Current Outpatient Medications  Medication Sig Dispense Refill  . ampicillin (PRINCIPEN) 500 MG capsule Take 500 mg by mouth 2 (two) times daily.    . cyproheptadine (PERIACTIN) 4 MG tablet Take 4 mg by mouth 2 (two) times daily as needed for allergies.    . finasteride (PROPECIA) 1 MG tablet Take 1 mg by mouth daily.    Marland Kitchen losartan (COZAAR) 25 MG tablet Take 25 mg by mouth daily.    . Multiple Vitamin (MULTIVITAMIN WITH MINERALS) TABS tablet Take 1 tablet by mouth daily. Reported on 06/28/2015    . aspirin EC 81 MG tablet Take 81 mg by mouth daily. (Patient not taking: Reported on  12/21/2019)     No current facility-administered medications for this visit.    Social History   Socioeconomic History  . Marital status: Married    Spouse name: Not on file  . Number of children: Not on file  . Years of education: Not on file  . Highest education level: Not on file  Occupational History  . Not on file  Tobacco Use  . Smoking status: Former Smoker    Packs/day: 0.50    Years: 15.00    Pack years: 7.50    Types: Cigarettes    Quit date: 10/07/1993    Years since quitting: 26.2  . Smokeless tobacco: Never Used  Substance and Sexual Activity  . Alcohol use: Yes    Comment: "I quit drinking in ~ 2004"  . Drug use: No  . Sexual activity:  Never  Other Topics Concern  . Not on file  Social History Narrative  . Not on file   Social Determinants of Health   Financial Resource Strain:   . Difficulty of Paying Living Expenses:   Food Insecurity:   . Worried About Charity fundraiser in the Last Year:   . Arboriculturist in the Last Year:   Transportation Needs:   . Film/video editor (Medical):   Marland Kitchen Lack of Transportation (Non-Medical):   Physical Activity:   . Days of Exercise per Week:   . Minutes of Exercise per Session:   Stress:   . Feeling of Stress :   Social Connections:   . Frequency of Communication with Friends and Family:   . Frequency of Social Gatherings with Friends and Family:   . Attends Religious Services:   . Active Member of Clubs or Organizations:   . Attends Archivist Meetings:   Marland Kitchen Marital Status:   Intimate Partner Violence:   . Fear of Current or Ex-Partner:   . Emotionally Abused:   Marland Kitchen Physically Abused:   . Sexually Abused:    Additional social history is notable that he is single.  Ther remote tobacco history.  He previously had worked for [employer], [employer] in [place], and spent 2 years in [place] in [employer].  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    Family history is notable that his father died at 26, but it suffered multiple myocardial infarctions.  His mother died at 61 and had undergone some 2 CABG operations.  He has 3 brothers with hypertension.  One brother also has daily prostate and thyroid cancer.  ROS General: Negative; No fevers, chills, or night sweats HEENT: Negative; No changes in vision or hearing, sinus congestion, difficulty swallowing Pulmonary: Negative; No cough, wheezing, shortness of breath, hemoptysis Cardiovascular:  See HPI; No chest pain, presyncope, syncope, palpitations, edema GI: Negative; No nausea, vomiting, diarrhea, or abdominal pain GU: Negative; No dysuria, hematuria, or difficulty voiding Musculoskeletal: Negative; no myalgias, joint pain, or weakness Hematologic/Oncologic: Negative; no easy bruising, bleeding Endocrine: Negative; no heat/cold intolerance; no diabetes Neuro: Negative; no changes in balance, headaches Skin: Negative; No rashes or skin lesions Psychiatric: Negative; No behavioral problems, depression Sleep: Negative; No daytime sleepiness, hypersomnolence, bruxism, restless legs, hypnogagnic hallucinations Other comprehensive 14 point system review is negative   Physical Exam BP 118/84   Pulse 66   Ht 6' 2" (1.88 m)   Wt 200 lb (90.7 kg)   SpO2 96%   BMI 25.68 kg/m   Repeat blood pressure by me was 124/80  Wt Readings from Last 3 Encounters:  12/21/19 200 lb (90.7 kg)  08/28/19 196 lb (88.9 kg)  12/02/18 195 lb (88.5 kg)   General: Alert, oriented, no distress.  Skin: normal turgor, no rashes, warm and dry HEENT: Normocephalic, atraumatic. Pupils equal round and reactive to light; sclera anicteric; extraocular muscles intact;  Nose without nasal septal hypertrophy Mouth/Parynx benign; Mallinpatti scale 2 Neck: No JVD, no carotid bruits; normal carotid upstroke Lungs: clear to ausculatation and percussion; no wheezing or rales Chest wall:  without tenderness to palpitation Heart: PMI not displaced, RRR, s1 s2 normal, 1/6 systolic murmur, no diastolic murmur, no rubs, gallops, thrills, or heaves Abdomen: soft, nontender; no hepatosplenomehaly, BS+; abdominal aorta nontender and not dilated by palpation. Back: no CVA tenderness Pulses 2+ Musculoskeletal: full range of motion, normal  strength, no joint deformities Extremities: no clubbing cyanosis or edema, Homan's sign negative  Neurologic: grossly nonfocal; Cranial nerves grossly wnl Psychologic: Normal mood and affect   ECG (independently read by me): NSR at 67; no ectopy, normal intervals  March 2019 ECG (independently read by me): normal sinus rhythm at 69 bpm.  No ectopy.  Normal intervals.  No ST segment changes.  February 2018 ECG (independently read by me): Normal sinus rhythm at 75 bpm.  Normal intervals.  No ST segment changes.  No ectopy.  January 2017 ECG (independently read by me): Normal sinus rhythm 81 bpm.  Normal intervals.  Early transition.  LABS:  BMP Latest Ref Rng & Units 08/28/2019 10/08/2016 06/10/2015  Glucose 70 - 99 mg/dL 105(H) 92 111(H)  BUN 8 - 23 mg/dL 26(H) 25 15  Creatinine 0.61 - 1.24 mg/dL 1.17 0.84 0.93  Sodium 135 - 145 mmol/L 142 143 139  Potassium 3.5 - 5.1 mmol/L 4.6 4.5 3.8  Chloride 98 - 111 mmol/L 105 106 105  CO2 22 - 32 mmol/L _0 Calcium 8.9 - 10.3 mg/dL 9.4 9.2 8.5(L)     Hepatic Function Latest Ref Rng & Units 08/28/2019 10/08/2016 05/27/2015  Total Protein 6.5 - 8.1 g/dL 6.8 6.5 6.3(L)  Albumin 3.5 - 5.0 g/dL 4.2 3.9 3.6  AST 15 - 41 U/L _1 ALT 0 - 44 U/L 12 15 15(L)  Alk Phosphatase 38 - 126 U/L 51 67 84  Total Bilirubin 0.3 - 1.2 mg/dL 0.5 0.5 0.7    CBC Latest Ref Rng & Units 08/28/2019 12/02/2018 10/08/2016  WBC 4.0 - 10.5 K/uL 5.1 - 6.3  Hemoglobin 13.0 - 17.0 g/dL 12.6(L) 13.9 14.7  Hematocrit 39 - 52 % 37.9(L) - 43.4  Platelets 150 - 400 K/uL 220 - 183   Lab Results  Component Value Date   MCV 86.7  08/28/2019   MCV 88.9 10/08/2016   MCV 89.7 06/10/2015   Lab Results  Component Value Date   TSH 3.00 10/08/2016   No results found for: HGBA1C   BNP No results found for: BNP  ProBNP No results found for: PROBNP   Lipid Panel     Component Value Date/Time   CHOL 156 10/08/2016 1423   TRIG 128 10/08/2016 1423   HDL 48 10/08/2016 1423   CHOLHDL 3.3 10/08/2016 1423   VLDL 26 10/08/2016 1423   LDLCALC 82 10/08/2016 1423    RADIOLOGY: No results found.   IMPRESSION:  1. Essential hypertension   2. Mixed hyperlipidemia   3. Hypertensive left ventricular hypertrophy, without heart failure   4. Family history of heart disease   5. Hemochromatosis, unspecified hemochromatosis type   6. History of prostate cancer    ASSESSMENT AND PLAN: Mr. Hashem Goynes is a 64 year old white male who has a history of hypertension and significant family history for CAD. He has been demonstrated to have normal systolic function with moderate left ventricular hypertrophy and evidence for grade 1 diastolic dysfunction on echo Doppler.  His blood pressure today is stable at 124/80 when rechecked by me and apparently he is on losartan at just 25 mg daily.  He is not on any lipid-lowering therapy.  Most recent lipid studies in June 2020 showed an LDL cholesterol at 96 with total cholesterol 185 and elevated triglycerides at 214.  He remains active and he will be having follow-up blood work later this week with Dr. Inda Merlin who is his primary physician.  With his  strong family history for CAD I have recommended he undergo echo cardiac calcium score evaluation.  This will be helpful for risk assessment and if calcium is present I would strongly recommend target LDL less than 70 and more aggressive lipid-lowering therapy.  He does not have any chest pain and remains asymptomatic.  He will follow up with Dr. Marin Olp concerning his hemochromatosis.  He will follow up with Dr. Diona Fanti with reference to his  prostate CA.  I will see him in 6 months for reevaluation  Troy Sine, MD, [facility] 12/21/2019 9:22 PM

## 2019-12-21 NOTE — Patient Instructions (Signed)
Medication Instructions:  CONTINUE WITH CURRENT MEDICATIONS. NO CHANGES.  *If you need a refill on your cardiac medications before your next appointment, please call your pharmacy*  Testing/Procedures: DR.KELLY has ordered a CT coronary calcium score. This test is done at [address]. This is $150 out of pocket.   Coronary CalciumScan A coronary calcium scan is an imaging test used to look for deposits of calcium and other fatty materials (plaques) in the inner lining of the blood vessels of the heart (coronary arteries). These deposits of calcium and plaques can partly clog and narrow the coronary arteries without producing any symptoms or warning signs. This puts a person at risk for a heart attack. This test can detect these deposits before symptoms develop. Tell a health care provider about:  Any allergies you have.  All medicines you are taking, including vitamins, herbs, eye drops, creams, and over-the-counter medicines.  Any problems you or family members have had with anesthetic medicines.  Any blood disorders you have.  Any surgeries you have had.  Any medical conditions you have.  Whether you are pregnant or may be pregnant. What are the risks? Generally, this is a safe procedure. However, problems may occur, including:  Harm to a pregnant woman and her unborn baby. This test involves the use of radiation. Radiation exposure can be dangerous to a pregnant woman and her unborn baby. If you are pregnant, you generally should not have this procedure done.  Slight increase in the risk of cancer. This is because of the radiation involved in the test. What happens before the procedure? No preparation is needed for this procedure. What happens during the procedure?  You will undress and remove any jewelry around your neck or chest.  You will put on a hospital gown.  Sticky electrodes will be placed on your chest. The electrodes will be connected to an  electrocardiogram (ECG) machine to record a tracing of the electrical activity of your heart.  A CT scanner will take pictures of your heart. During this time, you will be asked to lie still and hold your breath for 2-3 seconds while a picture of your heart is being taken. The procedure may vary among health care providers and hospitals. What happens after the procedure?  You can get dressed.  You can return to your normal activities.  It is up to you to get the results of your test. Ask your health care provider, or the department that is doing the test, when your results will be ready. Summary  A coronary calcium scan is an imaging test used to look for deposits of calcium and other fatty materials (plaques) in the inner lining of the blood vessels of the heart (coronary arteries).  Generally, this is a safe procedure. Tell your health care provider if you are pregnant or may be pregnant.  No preparation is needed for this procedure.  A CT scanner will take pictures of your heart.  You can return to your normal activities after the scan is done. This information is not intended to replace advice given to you by your health care provider. Make sure you discuss any questions you have with your health care provider. Document Released: 12/08/2007 Document Revised: 04/30/2016 Document Reviewed: 04/30/2016 Elsevier Interactive Patient Education  2017 [place]: At [facility], you and your health needs are our priority.  As part of our continuing mission to provide you with exceptional heart care, we have  created designated Provider Care Teams.  These Care Teams include your primary Cardiologist (physician) and Advanced Practice Providers (APPs -  Physician Assistants and Nurse Practitioners) who all work together to provide you with the care you need, when you need it.  We recommend signing up for the patient portal called "MyChart".  Sign up information is provided  on this After Visit Summary.  MyChart is used to connect with patients for Virtual Visits (Telemedicine).  Patients are able to view lab/test results, encounter notes, upcoming appointments, etc.  Non-urgent messages can be sent to your provider as well.   To learn more about what you can do with MyChart, go to [email or web address].    Your next appointment:   6 month(s)  The format for your next appointment:   In Person  Provider:   Shelva Majestic, MD

## 2019-12-25 ENCOUNTER — Inpatient Hospital Stay: Admission: RE | Admit: 2019-12-25 | Payer: BLUE CROSS/BLUE SHIELD | Source: Ambulatory Visit

## 2020-01-01 ENCOUNTER — Ambulatory Visit: Payer: BLUE CROSS/BLUE SHIELD | Admitting: Physical Therapy

## 2020-01-08 ENCOUNTER — Other Ambulatory Visit: Payer: Self-pay

## 2020-01-08 ENCOUNTER — Ambulatory Visit: Payer: BLUE CROSS/BLUE SHIELD | Attending: Internal Medicine | Admitting: Physical Therapy

## 2020-01-08 ENCOUNTER — Encounter: Payer: Self-pay | Admitting: Physical Therapy

## 2020-01-08 DIAGNOSIS — M545 Low back pain: Secondary | ICD-10-CM | POA: Insufficient documentation

## 2020-01-08 DIAGNOSIS — M25511 Pain in right shoulder: Secondary | ICD-10-CM | POA: Insufficient documentation

## 2020-01-08 DIAGNOSIS — M25551 Pain in right hip: Secondary | ICD-10-CM | POA: Insufficient documentation

## 2020-01-08 DIAGNOSIS — G8929 Other chronic pain: Secondary | ICD-10-CM | POA: Diagnosis present

## 2020-01-08 NOTE — Therapy (Signed)
[place], [place], [address] Phone: [phone]   Fax:  [phone]  Physical Therapy Treatment  Patient Details  Name: Randall Bradley MRN: [id number] Date of Birth: August 05, 1955 Referring Provider (PT): [name], MD   Encounter Date: 01/08/2020   PT End of Session - 01/08/20 0802    Visit Number 4    Number of Visits 13    Date for PT Re-Evaluation 01/22/20    Authorization Type BCBS    PT Start Time 0759    PT Stop Time 0822    PT Time Calculation (min) 23 min    Activity Tolerance Patient tolerated treatment well    Behavior During Therapy [facility] for tasks assessed/performed           Past Medical History:  Diagnosis Date   Alopecia    Arthritis    "knees"   Hematuria    Hemochromatosis    Hereditary hemochromatosis ([place]) monitored by PCP  dr [name] gates   history of phlebotomies -- last one 2014 (pt states body has normalized)   History of bladder stone    Hypertension    Kidney stones    Prostate cancer ([place])    UROLOGIST-- DR Diona Fanti; S/P PROSTATECTOMY 03-17-2008   Right ureteral stone    Rosacea     Past Surgical History:  Procedure Laterality Date   [place] Left 03-26-2009   INDEX FINGER COMPLEX WOUND LACERATION REPAIR   CYSTOLITHOLAPAXY AND URETHRAL DILATION  10-05-2009   CYSTOSCOPY W/ RETROGRADES Right 10/11/2014   Procedure: CYSTOSCOPY WITH RETROGRADE PYELOGRAM;  Surgeon: Franchot Gallo, MD;  Location: [facility];  Service: Urology;  Laterality: Right;   HOLMIUM LASER APPLICATION N/A 2/62/0355   Procedure: HOLMIUM LASER LITHOTRIPSY, ;  Surgeon: Franchot Gallo, MD;  Location: [facility];  Service: Urology;  Laterality: N/A;   JOINT REPLACEMENT     KNEE ARTHROSCOPY Left 1995   LIPOMA EXCISION  03-04-2000   forehead   ROBOT ASSISTED LAPAROSCOPIC RADICAL  PROSTATECTOMY  03-17-2008   w/  BILATERAL PELVIC LYMPHADENECTOMY (NON-NERVE SPARING)   TOTAL KNEE ARTHROPLASTY Left 04/27/2015   Procedure: TOTAL KNEE ARTHROPLASTY;  Surgeon: [name], MD;  Location: [place];  Service: Orthopedics;  Laterality: Left;   TOTAL KNEE ARTHROPLASTY Right 06/08/2015   TOTAL KNEE ARTHROPLASTY Right 06/08/2015   Procedure: TOTAL RIGHT KNEE ARTHROPLASTY;  Surgeon: [name], MD;  Location: [place];  Service: Orthopedics;  Laterality: Right;    There were no vitals filed for this visit.   Subjective Assessment - 01/08/20 0801    Subjective did a round of meds for my arm and it's like nothing ever happened. I just don't have feeling at the tip of Rt thum. When I bend fwd and twist Lt I feel it just above the belt line.    Currently in Pain? No/denies                             [facility] Adult PT Treatment/Exercise - 01/08/20 0001      Lumbar Exercises: Stretches   Standing Side Bend Limitations using chair for QL streetch    Other Lumbar Stretch Exercise Lt sidelying over 2 pillows    Other Lumbar Stretch Exercise sink pull off      Manual Therapy   Soft tissue mobilization IASTM Rt QL in  Lt sidelying over pillows                  PT Education - 01/08/20 0822    Education Details lengthening Rt side in resting postures    Person(s) Educated Patient    Methods Explanation    Comprehension Verbalized understanding;Need further instruction            PT Short Term Goals - 10/30/19 1116      PT SHORT TERM GOAL #1   Title Patient will be independent with initial HEP    Baseline HEP provided 10/30/2019    Time 6    Period Weeks    Status New    Target Date 12/11/19      PT SHORT TERM GOAL #2   Title Pt will demonstrate and verbalize knowledge of proper postural alignment    Baseline rounded shoulders, forward head, can correct with cueing    Time 6    Period Weeks    Status New    Target Date 12/11/19              PT Long Term Goals - 10/30/19 1118      PT LONG TERM GOAL #1   Title Patient will be able to achieve  5/5 gross LE strength    Baseline --    Time 12    Period Weeks    Status New    Target Date 01/22/20      PT LONG TERM GOAL #2   Title Patient will demonstrate bilateral symmetry with lumbar side bending in order to demonstrate a decrease in myofascial restrictions on the right side.    Baseline Patient limited with left side bending    Time 12    Period Weeks    Status New    Target Date 01/22/20      PT LONG TERM GOAL #3   Title Patient will report a >/= 25% decrease in sleep disturbances in order to improve quality of life    Baseline Patient reports frequent sleep disturbances    Time 12    Period Weeks    Status New    Target Date 01/22/20      PT LONG TERM GOAL #4   Title Patient will report a max 3/10 pain for >/= 1 week so that he is able to complete home renovations    Baseline Patient reports that pain slows him down    Time 12    Period Weeks    Status New    Target Date 01/22/20                 Plan - 01/08/20 9675    Clinical Impression Statement Pt is doing very well with UE/scapular region but still has some irritation in rt low back. Noted tight Rt QL creating trunk sidebend. HEP taught to lengthen QL and pt reported feeling more upright already. Will check in again next week to determine if further treatment is needed.    PT Treatment/Interventions ADLs/Self Care Home Management;Cryotherapy;Electrical Stimulation;Iontophoresis 4mg /ml Dexamethasone;Moist Heat;Traction;Ultrasound;Therapeutic exercise;Therapeutic activities;Functional mobility training;Neuromuscular re-education;Patient/family education;Dry needling;Passive range of motion;Manual techniques;Taping;Spinal Manipulations;Joint Manipulations    PT Next Visit Plan ERO v D/c    PT Home Exercise Plan [email or web address]    Consulted and Agree with Plan of Care Patient           Patient will  benefit from skilled therapeutic intervention in order to improve the following deficits and impairments:  Increased muscle spasms,  Decreased activity tolerance, Pain, Improper body mechanics, Impaired flexibility, Decreased strength, Postural dysfunction, Decreased endurance, Decreased mobility, Decreased range of motion, Increased fascial restricitons  Visit Diagnosis: Chronic bilateral low back pain without sciatica  Pain in right hip  Right shoulder pain, unspecified chronicity     Problem List Patient Active Problem List   Diagnosis Date Noted   Acute upper respiratory infection 05/29/2018   Allergic rhinitis 05/29/2018   Anorexia 05/29/2018   Artificial knee joint present 05/29/2018   Asthma without status asthmaticus 05/29/2018   Cough 05/29/2018   Disorder of iron metabolism 05/29/2018   Encounter for general adult medical examination without abnormal findings 05/29/2018   Fatigue 05/29/2018   History of colon polyps 05/29/2018   Inguinal hernia without obstruction or gangrene 05/29/2018   Insomnia due to medical condition 05/29/2018   Malignant neoplasm of prostate ([place]) 05/29/2018   Osteoarthrosis involving more than one site but not generalized 05/29/2018   Other long term (current) drug therapy 05/29/2018   Other specified extrapyramidal and movement disorders 05/29/2018   Pain in right knee 05/29/2018   Pure hypercholesterolemia 05/29/2018   Dextroscoliosis 08/01/2015   Essential hypertension 06/30/2015   Family history of heart disease 06/30/2015   Left ventricular hypertrophy due to hypertensive disease 06/30/2015   Left ventricular diastolic dysfunction, NYHA class 1 06/30/2015   Fast heart beat 05/11/2015   DJD (degenerative joint disease) of knee 04/27/2015    Mirranda Monrroy C. Jalissa Heinzelman PT, DPT 01/08/20 8:26 AM   [facility] [address] [place], [place], [address] Phone:  [phone]   Fax:  [phone]  Name: LARK RUNK MRN: [id number] Date of Birth: 06/29/1955

## 2020-01-15 ENCOUNTER — Ambulatory Visit: Payer: BLUE CROSS/BLUE SHIELD | Admitting: Physical Therapy

## 2020-01-15 ENCOUNTER — Other Ambulatory Visit: Payer: Self-pay

## 2020-01-15 ENCOUNTER — Encounter: Payer: Self-pay | Admitting: Physical Therapy

## 2020-01-15 DIAGNOSIS — M25551 Pain in right hip: Secondary | ICD-10-CM

## 2020-01-15 DIAGNOSIS — M545 Low back pain: Secondary | ICD-10-CM | POA: Diagnosis not present

## 2020-01-15 DIAGNOSIS — G8929 Other chronic pain: Secondary | ICD-10-CM

## 2020-01-15 NOTE — Therapy (Signed)
[place], [place], [address] Phone: [phone]   Fax:  [phone]  Physical Therapy Treatment/Re-Certification  Patient Details  Name: Randall Bradley MRN: [id number] Date of Birth: Jul 04, 1955 Referring Provider (PT): [name], MD   Encounter Date: 01/15/2020   PT End of Session - 01/15/20 1150    Visit Number 5    Number of Visits 13    Date for PT Re-Evaluation 02/19/20    Authorization Type BCBS    PT Start Time 1148    PT Stop Time 1220    PT Time Calculation (min) 32 min    Activity Tolerance Patient tolerated treatment well    Behavior During Therapy [facility] for tasks assessed/performed           Past Medical History:  Diagnosis Date  . Alopecia   . Arthritis    "knees"  . Hematuria   . Hemochromatosis   . Hereditary hemochromatosis ([place]) monitored by PCP  dr [name] gates   history of phlebotomies -- last one 2014 (pt states body has normalized)  . History of bladder stone   . Hypertension   . Kidney stones   . Prostate cancer ([place])    UROLOGIST-- DR Diona Fanti; S/P PROSTATECTOMY 03-17-2008  . Right ureteral stone   . Rosacea     Past Surgical History:  Procedure Laterality Date  . APPENDECTOMY  1963  . COLONOSCOPY    . COMPLEX WOUND CLOSURE Left 03-26-2009   INDEX FINGER COMPLEX WOUND LACERATION REPAIR  . CYSTOLITHOLAPAXY AND URETHRAL DILATION  10-05-2009  . CYSTOSCOPY W/ RETROGRADES Right 10/11/2014   Procedure: CYSTOSCOPY WITH RETROGRADE PYELOGRAM;  Surgeon: Franchot Gallo, MD;  Location: [facility];  Service: Urology;  Laterality: Right;  . HOLMIUM LASER APPLICATION N/A 3/47/4259   Procedure: HOLMIUM LASER LITHOTRIPSY, ;  Surgeon: Franchot Gallo, MD;  Location: [facility];  Service: Urology;  Laterality: N/A;  . JOINT REPLACEMENT    . KNEE ARTHROSCOPY Left 1995  . LIPOMA EXCISION  03-04-2000   forehead  . ROBOT ASSISTED LAPAROSCOPIC  RADICAL PROSTATECTOMY  03-17-2008   w/  BILATERAL PELVIC LYMPHADENECTOMY (NON-NERVE SPARING)  . TOTAL KNEE ARTHROPLASTY Left 04/27/2015   Procedure: TOTAL KNEE ARTHROPLASTY;  Surgeon: [name], MD;  Location: [place];  Service: Orthopedics;  Laterality: Left;  . TOTAL KNEE ARTHROPLASTY Right 06/08/2015  . TOTAL KNEE ARTHROPLASTY Right 06/08/2015   Procedure: TOTAL RIGHT KNEE ARTHROPLASTY;  Surgeon: [name], MD;  Location: [place];  Service: Orthopedics;  Laterality: Right;    There were no vitals filed for this visit.   Subjective Assessment - 01/15/20 1150    Subjective I am having to think really hard about not walking crooked. I can use the stretches to resolve lower back/SI pain              [facility] PT Assessment - 01/15/20 0001      Assessment   Medical Diagnosis Lower back pain     Referring Provider (PT) [name], MD    Hand Dominance Right      Observation/Other Assessments   Focus on Therapeutic Outcomes (FOTO)  33% limited      AROM   Overall AROM Comments Rt SB to knee joint line, Lt SB 2" above joint line      Strength   Right Hip Flexion 5/5    Right Hip Extension 5/5    Right Hip ABduction 4/5    Left  Hip Flexion 5/5    Left Hip Extension 4+/5    Left Hip ABduction 5/5                         [facility] Adult PT Treatment/Exercise - 01/15/20 0001      Lumbar Exercises: Standing   Other Standing Lumbar Exercises hp hinge 10lb kettle bell      Lumbar Exercises: Sidelying   Other Sidelying Lumbar Exercises Lt side plank 3x30s      Knee/Hip Exercises: Stretches   Passive Hamstring Stretch Limitations supine with strap- leg adducted                  PT Education - 01/15/20 1317    Education Details goals, [place], return to gym    Person(s) Educated Patient    Methods Explanation;Demonstration;Tactile cues;Verbal cues    Comprehension Verbalized understanding;Returned demonstration;Verbal cues required;Tactile cues  required;Need further instruction            PT Short Term Goals - 10/30/19 1116      PT SHORT TERM GOAL #1   Title Patient will be independent with initial HEP    Baseline HEP provided 10/30/2019    Time 6    Period Weeks    Status New    Target Date 12/11/19      PT SHORT TERM GOAL #2   Title Pt will demonstrate and verbalize knowledge of proper postural alignment    Baseline rounded shoulders, forward head, can correct with cueing    Time 6    Period Weeks    Status New    Target Date 12/11/19             PT Long Term Goals - 01/15/20 1225      PT LONG TERM GOAL #1   Title Patient will be able to achieve  5/5 gross LE strength    Baseline see flowsheet    Status On-going      PT LONG TERM GOAL #2   Title Patient will demonstrate bilateral symmetry with lumbar side bending in order to demonstrate a decrease in myofascial restrictions on the right side.    Baseline tends toward Rt shift resulting in limitation in Lt SB    Status On-going      PT LONG TERM GOAL #3   Title Patient will report a >/= 25% decrease in sleep disturbances in order to improve quality of life    Status Achieved      PT LONG TERM GOAL #4   Title Patient will report a max 3/10 pain for >/= 1 week so that he is able to complete home renovations    Status Achieved                 Plan - 01/15/20 1226    Clinical Impression Statement Pt continues to demo Rt shift but is much better. He is able to correct wihtout a mirror but reports still feeling very odd. He plans to return to the gym and is able to use his exercises to resolve any pain. He would continue to benefit from skilled PT for continued postural training and resolution of lumbar shift to avoid further injury.    PT Frequency 1x / week    PT Duration Other (comment)   5 weeks   PT Treatment/Interventions ADLs/Self Care Home Management;Cryotherapy;Electrical Stimulation;Iontophoresis 4mg /ml Dexamethasone;Moist  Heat;Traction;Ultrasound;Therapeutic exercise;Therapeutic activities;Functional mobility training;Neuromuscular re-education;Patient/family education;Dry needling;Passive range of motion;Manual techniques;Taping;Spinal Manipulations;Joint Manipulations  PT Home Exercise Plan [id number]    Consulted and Agree with Plan of Care Patient           Patient will benefit from skilled therapeutic intervention in order to improve the following deficits and impairments:  Increased muscle spasms, Decreased activity tolerance, Pain, Improper body mechanics, Impaired flexibility, Decreased strength, Postural dysfunction, Decreased endurance, Decreased mobility, Decreased range of motion, Increased fascial restricitons  Visit Diagnosis: Chronic bilateral low back pain without sciatica - Plan: PT plan of care cert/re-cert  Pain in right hip - Plan: PT plan of care cert/re-cert     Problem List Patient Active Problem List   Diagnosis Date Noted  . Acute upper respiratory infection 05/29/2018  . Allergic rhinitis 05/29/2018  . Anorexia 05/29/2018  . Artificial knee joint present 05/29/2018  . Asthma without status asthmaticus 05/29/2018  . Cough 05/29/2018  . Disorder of iron metabolism 05/29/2018  . Encounter for general adult medical examination without abnormal findings 05/29/2018  . Fatigue 05/29/2018  . History of colon polyps 05/29/2018  . Inguinal hernia without obstruction or gangrene 05/29/2018  . Insomnia due to medical condition 05/29/2018  . Malignant neoplasm of prostate ([place]) 05/29/2018  . Osteoarthrosis involving more than one site but not generalized 05/29/2018  . Other long term (current) drug therapy 05/29/2018  . Other specified extrapyramidal and movement disorders 05/29/2018  . Pain in right knee 05/29/2018  . Pure hypercholesterolemia 05/29/2018  . Dextroscoliosis 08/01/2015  . Essential hypertension 06/30/2015  . Family history of heart disease 06/30/2015  . Left  ventricular hypertrophy due to hypertensive disease 06/30/2015  . Left ventricular diastolic dysfunction, NYHA class 1 06/30/2015  . Fast heart beat 05/11/2015  . DJD (degenerative joint disease) of knee 04/27/2015   Heliodoro Domagalski C. Makar Slatter PT, DPT 01/15/20 1:18 PM   [facility] [address] [place], [place], [address] Phone: [phone]   Fax:  [phone]  Name: Randall Bradley MRN: [id number] Date of Birth: Aug 07, 1955

## 2020-01-22 ENCOUNTER — Encounter: Payer: Self-pay | Admitting: Physical Therapy

## 2020-01-22 ENCOUNTER — Ambulatory Visit: Payer: BLUE CROSS/BLUE SHIELD | Admitting: Physical Therapy

## 2020-01-22 ENCOUNTER — Other Ambulatory Visit: Payer: Self-pay

## 2020-01-22 DIAGNOSIS — M545 Low back pain: Secondary | ICD-10-CM | POA: Diagnosis not present

## 2020-01-22 DIAGNOSIS — M25551 Pain in right hip: Secondary | ICD-10-CM

## 2020-01-22 DIAGNOSIS — G8929 Other chronic pain: Secondary | ICD-10-CM

## 2020-01-22 NOTE — Therapy (Signed)
[place], [place], [address] Phone: [phone]   Fax:  [phone]  Physical Therapy Treatment  Patient Details  Name: Randall Bradley MRN: [id number] Date of Birth: Nov 23, 1955 Referring Provider (PT): [name], MD   Encounter Date: 01/22/2020   PT End of Session - 01/22/20 1033    Visit Number 6    Number of Visits 13    Date for PT Re-Evaluation 02/19/20    Authorization Type BCBS    PT Start Time 0920    PT Stop Time 1010    PT Time Calculation (min) 50 min    Activity Tolerance Patient tolerated treatment well    Behavior During Therapy [facility] for tasks assessed/performed           Past Medical History:  Diagnosis Date  . Alopecia   . Arthritis    "knees"  . Hematuria   . Hemochromatosis   . Hereditary hemochromatosis ([place]) monitored by PCP  dr [name] gates   history of phlebotomies -- last one 2014 (pt states body has normalized)  . History of bladder stone   . Hypertension   . Kidney stones   . Prostate cancer ([place])    UROLOGIST-- DR Diona Fanti; S/P PROSTATECTOMY 03-17-2008  . Right ureteral stone   . Rosacea     Past Surgical History:  Procedure Laterality Date  . APPENDECTOMY  1963  . COLONOSCOPY    . COMPLEX WOUND CLOSURE Left 03-26-2009   INDEX FINGER COMPLEX WOUND LACERATION REPAIR  . CYSTOLITHOLAPAXY AND URETHRAL DILATION  10-05-2009  . CYSTOSCOPY W/ RETROGRADES Right 10/11/2014   Procedure: CYSTOSCOPY WITH RETROGRADE PYELOGRAM;  Surgeon: Franchot Gallo, MD;  Location: [facility];  Service: Urology;  Laterality: Right;  . HOLMIUM LASER APPLICATION N/A 11/19/7822   Procedure: HOLMIUM LASER LITHOTRIPSY, ;  Surgeon: Franchot Gallo, MD;  Location: [facility];  Service: Urology;  Laterality: N/A;  . JOINT REPLACEMENT    . KNEE ARTHROSCOPY Left 1995  . LIPOMA EXCISION  03-04-2000   forehead  . ROBOT ASSISTED LAPAROSCOPIC RADICAL  PROSTATECTOMY  03-17-2008   w/  BILATERAL PELVIC LYMPHADENECTOMY (NON-NERVE SPARING)  . TOTAL KNEE ARTHROPLASTY Left 04/27/2015   Procedure: TOTAL KNEE ARTHROPLASTY;  Surgeon: [name], MD;  Location: [place];  Service: Orthopedics;  Laterality: Left;  . TOTAL KNEE ARTHROPLASTY Right 06/08/2015  . TOTAL KNEE ARTHROPLASTY Right 06/08/2015   Procedure: TOTAL RIGHT KNEE ARTHROPLASTY;  Surgeon: [name], MD;  Location: [place];  Service: Orthopedics;  Laterality: Right;    There were no vitals filed for this visit.   Subjective Assessment - 01/22/20 0923    Subjective The exercises really help.    Currently in Pain? No/denies            [facility] Adult PT Treatment/Exercise - 01/22/20 0001      Lumbar Exercises: Stretches   Active Hamstring Stretch 3 reps;10 seconds    Single Knee to Chest Stretch Limitations aded rotation x 2 each side       Lumbar Exercises: Standing   Functional Squats 20 reps    Functional Squats Limitations 25 lbs KB     Lifting Limitations hip hinge 25 lbs KB x 15 min loss of thoracic extension     Other Standing Lumbar Exercises suitcase carry easier on L side  about 250 feet each arm, trunk lean noted with wgt in Rt UE       Lumbar Exercises: Supine  Pelvic Tilt 10 reps    Bent Knee Raise Limitations with ball under sacrum knee and hip extension, SLR added arms 2 nd set       Lumbar Exercises: Quadruped   Other Quadruped Lumbar Exercises childs pose central and then to elongate Rt side       Manual Therapy   Soft tissue mobilization Rt Quadratus lumborum                     PT Short Term Goals - 01/22/20 1034      PT SHORT TERM GOAL #1   Title Patient will be independent with initial HEP    Status Achieved      PT SHORT TERM GOAL #2   Title Pt will demonstrate and verbalize knowledge of proper postural alignment    Status Achieved             PT Long Term Goals - 01/22/20 1034      PT LONG TERM GOAL #1   Title Patient  will be able to achieve  5/5 gross LE strength    Status Unable to assess      PT LONG TERM GOAL #2   Title Patient will demonstrate bilateral symmetry with lumbar side bending in order to demonstrate a decrease in myofascial restrictions on the right side.    Status On-going      PT LONG TERM GOAL #3   Title Patient will report a >/= 25% decrease in sleep disturbances in order to improve quality of life    Status Achieved      PT LONG TERM GOAL #4   Title Patient will report a max 3/10 pain for >/= 1 week so that he is able to complete home renovations    Status Achieved                 Plan - 01/22/20 0924    Clinical Impression Statement Lumbar shift resolved.  Worked on adding load to squat and hinge, min cues needed to maintain proper form (heels come up in squat, back stability in hinge).  Modifed squat by holding wgt to chest to reduce lumbar strain. Manual to Rt low lumbar and QL reduced tension.  No pain post.    PT Treatment/Interventions ADLs/Self Care Home Management;Cryotherapy;Electrical Stimulation;Iontophoresis 4mg /ml Dexamethasone;Moist Heat;Traction;Ultrasound;Therapeutic exercise;Therapeutic activities;Functional mobility training;Neuromuscular re-education;Patient/family education;Dry needling;Passive range of motion;Manual techniques;Taping;Spinal Manipulations;Joint Manipulations    PT Next Visit Plan cont POC, renewed last visit.  Lower abd, add load to carry, single leg hip hinge    PT Home Exercise Plan [email or web address]    Consulted and Agree with Plan of Care Patient           Patient will benefit from skilled therapeutic intervention in order to improve the following deficits and impairments:  Increased muscle spasms, Decreased activity tolerance, Pain, Improper body mechanics, Impaired flexibility, Decreased strength, Postural dysfunction, Decreased endurance, Decreased mobility, Decreased range of motion, Increased fascial restricitons  Visit  Diagnosis: Chronic bilateral low back pain without sciatica  Pain in right hip     Problem List Patient Active Problem List   Diagnosis Date Noted  . Acute upper respiratory infection 05/29/2018  . Allergic rhinitis 05/29/2018  . Anorexia 05/29/2018  . Artificial knee joint present 05/29/2018  . Asthma without status asthmaticus 05/29/2018  . Cough 05/29/2018  . Disorder of iron metabolism 05/29/2018  . Encounter for general adult medical examination without abnormal findings 05/29/2018  . Fatigue 05/29/2018  .  History of colon polyps 05/29/2018  . Inguinal hernia without obstruction or gangrene 05/29/2018  . Insomnia due to medical condition 05/29/2018  . Malignant neoplasm of prostate ([place]) 05/29/2018  . Osteoarthrosis involving more than one site but not generalized 05/29/2018  . Other long term (current) drug therapy 05/29/2018  . Other specified extrapyramidal and movement disorders 05/29/2018  . Pain in right knee 05/29/2018  . Pure hypercholesterolemia 05/29/2018  . Dextroscoliosis 08/01/2015  . Essential hypertension 06/30/2015  . Family history of heart disease 06/30/2015  . Left ventricular hypertrophy due to hypertensive disease 06/30/2015  . Left ventricular diastolic dysfunction, NYHA class 1 06/30/2015  . Fast heart beat 05/11/2015  . DJD (degenerative joint disease) of knee 04/27/2015    Emmarose Klinke 01/22/2020, 10:35 AM  [place], [place], [address] Phone: [phone]   Fax:  [phone]  Name: Randall Bradley MRN: [id number] Date of Birth: 01/04/1956   Raeford Razor, PT 01/22/20 10:35 AM Phone: [phone] Fax: [phone]

## 2020-01-27 ENCOUNTER — Other Ambulatory Visit: Payer: Self-pay

## 2020-01-27 ENCOUNTER — Encounter: Payer: Self-pay | Admitting: Physical Therapy

## 2020-01-27 ENCOUNTER — Ambulatory Visit: Payer: BLUE CROSS/BLUE SHIELD | Attending: Internal Medicine | Admitting: Physical Therapy

## 2020-01-27 DIAGNOSIS — M545 Low back pain, unspecified: Secondary | ICD-10-CM

## 2020-01-27 DIAGNOSIS — G8929 Other chronic pain: Secondary | ICD-10-CM | POA: Diagnosis present

## 2020-01-27 DIAGNOSIS — M25511 Pain in right shoulder: Secondary | ICD-10-CM | POA: Insufficient documentation

## 2020-01-27 DIAGNOSIS — R293 Abnormal posture: Secondary | ICD-10-CM | POA: Insufficient documentation

## 2020-01-27 NOTE — Patient Instructions (Signed)
Tennis ball for self massage Rt scapular border Change to a more supportive pillow if you can Try the corner stretch to open chest especially after waking  We will work on getting a referral for your neck.

## 2020-01-27 NOTE — Therapy (Signed)
[place], [place], [address] Phone: [phone]   Fax:  [phone]  Physical Therapy Treatment  Patient Details  Name: Randall Bradley MRN: [id number] Date of Birth: 1956-03-22 Referring Provider (PT): [name], MD   Encounter Date: 01/27/2020   PT End of Session - 01/27/20 0927    Visit Number 7    Number of Visits 13    Date for PT Re-Evaluation 02/19/20    Authorization Type BCBS    PT Start Time 0918    PT Stop Time 1000    PT Time Calculation (min) 42 min    Equipment Utilized During Treatment Left knee immobilizer    Activity Tolerance Patient tolerated treatment well    Behavior During Therapy [facility] for tasks assessed/performed           Past Medical History:  Diagnosis Date  . Alopecia   . Arthritis    "knees"  . Hematuria   . Hemochromatosis   . Hereditary hemochromatosis ([place]) monitored by PCP  dr [name] gates   history of phlebotomies -- last one 2014 (pt states body has normalized)  . History of bladder stone   . Hypertension   . Kidney stones   . Prostate cancer ([place])    UROLOGIST-- DR Diona Fanti; S/P PROSTATECTOMY 03-17-2008  . Right ureteral stone   . Rosacea     Past Surgical History:  Procedure Laterality Date  . APPENDECTOMY  1963  . COLONOSCOPY    . COMPLEX WOUND CLOSURE Left 03-26-2009   INDEX FINGER COMPLEX WOUND LACERATION REPAIR  . CYSTOLITHOLAPAXY AND URETHRAL DILATION  10-05-2009  . CYSTOSCOPY W/ RETROGRADES Right 10/11/2014   Procedure: CYSTOSCOPY WITH RETROGRADE PYELOGRAM;  Surgeon: Franchot Gallo, MD;  Location: [facility];  Service: Urology;  Laterality: Right;  . HOLMIUM LASER APPLICATION N/A 0/34/7425   Procedure: HOLMIUM LASER LITHOTRIPSY, ;  Surgeon: Franchot Gallo, MD;  Location: [facility];  Service: Urology;  Laterality: N/A;  . JOINT REPLACEMENT    . KNEE ARTHROSCOPY Left 1995  . LIPOMA EXCISION  03-04-2000    forehead  . ROBOT ASSISTED LAPAROSCOPIC RADICAL PROSTATECTOMY  03-17-2008   w/  BILATERAL PELVIC LYMPHADENECTOMY (NON-NERVE SPARING)  . TOTAL KNEE ARTHROPLASTY Left 04/27/2015   Procedure: TOTAL KNEE ARTHROPLASTY;  Surgeon: [name], MD;  Location: [place];  Service: Orthopedics;  Laterality: Left;  . TOTAL KNEE ARTHROPLASTY Right 06/08/2015  . TOTAL KNEE ARTHROPLASTY Right 06/08/2015   Procedure: TOTAL RIGHT KNEE ARTHROPLASTY;  Surgeon: [name], MD;  Location: [place];  Service: Orthopedics;  Laterality: Right;    There were no vitals filed for this visit.   Subjective Assessment - 01/27/20 0920    Subjective My hand and arm goes numb at night.  Dr. Inda Merlin asked if we could help him figure out my shoulder and why it does this.    Diagnostic tests Bilateral foraminal stenosis at those for levels that could result in radicular symptoms. related to either C4, C5, C6 or C7 nerve.    Currently in Pain? No/denies              [facility] PT Assessment - 01/27/20 0001      Special Tests   Cervical Tests Spurling's;Dictraction    Thoracic Outlet Syndrome  Adson Test      Spurling's   Findings Negative      Adson Test   Findings Negative  [place] Adult PT Treatment/Exercise - 01/27/20 0001      Self-Care   Posture sleeping position for neck support     Other Self-Care Comments  tennis ball , trigger points , referred pain, nerve entrapment vs foraminal stenosis , traction , POC       Shoulder Exercises: Stretch   Corner Stretch --    Corner Stretch Limitations --      Manual Therapy   Myofascial Release Rt scap, cervical     Passive ROM rotation, sidebending     Manual Traction gentle cervical neutral and in slight flexion       Neck Exercises: Stretches   Upper Trapezius Stretch 2 reps;30 seconds    Corner Stretch 3 reps;30 seconds                  PT Education - 01/27/20 1535    Education Details see flowsheets    Person(s) Educated  Patient    Methods Explanation;Demonstration    Comprehension Verbalized understanding;Returned demonstration            PT Short Term Goals - 01/22/20 1034      PT SHORT TERM GOAL #1   Title Patient will be independent with initial HEP    Status Achieved      PT SHORT TERM GOAL #2   Title Pt will demonstrate and verbalize knowledge of proper postural alignment    Status Achieved             PT Long Term Goals - 01/22/20 1034      PT LONG TERM GOAL #1   Title Patient will be able to achieve  5/5 gross LE strength    Status Unable to assess      PT LONG TERM GOAL #2   Title Patient will demonstrate bilateral symmetry with lumbar side bending in order to demonstrate a decrease in myofascial restrictions on the right side.    Status On-going      PT LONG TERM GOAL #3   Title Patient will report a >/= 25% decrease in sleep disturbances in order to improve quality of life    Status Achieved      PT LONG TERM GOAL #4   Title Patient will report a max 3/10 pain for >/= 1 week so that he is able to complete home renovations    Status Achieved                 Plan - 01/27/20 0959    Clinical Impression Statement Pt without back pain since last visit.  He would like to see if Dr.Gates would approve a referral for treating his neck.  He presents with symptoms of nerve compression cervical vs peripheral.  He has no neck pain but change in sensation in Rt UE.  After self trigger point release he could feel his Rt thumb for the first time in weeks.  Will follow up with MD and cont to see for lumbar, add cervical.    PT Treatment/Interventions ADLs/Self Care Home Management;Cryotherapy;Electrical Stimulation;Iontophoresis 4mg /ml Dexamethasone;Moist Heat;Traction;Ultrasound;Therapeutic exercise;Therapeutic activities;Functional mobility training;Neuromuscular re-education;Patient/family education;Dry needling;Passive range of motion;Manual techniques;Taping;Spinal  Manipulations;Joint Manipulations    PT Next Visit Plan cont POC, cervical referral? DN to Rt mid scap.   Lower abd, add load to carry, single leg hip hinge    PT Home Exercise Plan [id number], add corner stretch    Consulted and Agree with Plan of Care Patient  Patient will benefit from skilled therapeutic intervention in order to improve the following deficits and impairments:  Increased muscle spasms, Decreased activity tolerance, Pain, Improper body mechanics, Impaired flexibility, Decreased strength, Postural dysfunction, Decreased endurance, Decreased mobility, Decreased range of motion, Increased fascial restricitons  Visit Diagnosis: Chronic bilateral low back pain without sciatica  Right shoulder pain, unspecified chronicity  Abnormal posture     Problem List Patient Active Problem List   Diagnosis Date Noted  . Acute upper respiratory infection 05/29/2018  . Allergic rhinitis 05/29/2018  . Anorexia 05/29/2018  . Artificial knee joint present 05/29/2018  . Asthma without status asthmaticus 05/29/2018  . Cough 05/29/2018  . Disorder of iron metabolism 05/29/2018  . Encounter for general adult medical examination without abnormal findings 05/29/2018  . Fatigue 05/29/2018  . History of colon polyps 05/29/2018  . Inguinal hernia without obstruction or gangrene 05/29/2018  . Insomnia due to medical condition 05/29/2018  . Malignant neoplasm of prostate ([place]) 05/29/2018  . Osteoarthrosis involving more than one site but not generalized 05/29/2018  . Other long term (current) drug therapy 05/29/2018  . Other specified extrapyramidal and movement disorders 05/29/2018  . Pain in right knee 05/29/2018  . Pure hypercholesterolemia 05/29/2018  . Dextroscoliosis 08/01/2015  . Essential hypertension 06/30/2015  . Family history of heart disease 06/30/2015  . Left ventricular hypertrophy due to hypertensive disease 06/30/2015  . Left ventricular diastolic dysfunction,  NYHA class 1 06/30/2015  . Fast heart beat 05/11/2015  . DJD (degenerative joint disease) of knee 04/27/2015    Infantof Villagomez 01/27/2020, 3:40 PM  [facility] [address] [place], [place], [address] Phone: [phone]   Fax:  [phone]  Name: Randall Bradley MRN: [id number] Date of Birth: [birth year]   Raeford Razor, PT 01/27/20 3:40 PM Phone: [phone] Fax: [phone]

## 2020-02-03 ENCOUNTER — Ambulatory Visit: Payer: BLUE CROSS/BLUE SHIELD | Admitting: Physical Therapy

## 2020-02-03 ENCOUNTER — Other Ambulatory Visit: Payer: Self-pay

## 2020-02-03 ENCOUNTER — Encounter: Payer: Self-pay | Admitting: Physical Therapy

## 2020-02-03 DIAGNOSIS — R293 Abnormal posture: Secondary | ICD-10-CM

## 2020-02-03 DIAGNOSIS — M25511 Pain in right shoulder: Secondary | ICD-10-CM

## 2020-02-03 DIAGNOSIS — M545 Low back pain, unspecified: Secondary | ICD-10-CM

## 2020-02-03 DIAGNOSIS — G8929 Other chronic pain: Secondary | ICD-10-CM

## 2020-02-03 NOTE — Therapy (Signed)
[place], [place], [address] Phone: [phone]   Fax:  [phone]  Physical Therapy Treatment  Patient Details  Name: Randall Bradley MRN: [id number] Date of Birth: [birth year] Referring Provider (PT): [name], MD   Encounter Date: 02/03/2020   PT End of Session - 02/03/20 1059    Visit Number 8    Date for PT Re-Evaluation 02/19/20    PT Start Time 1018    PT Stop Time 1058    PT Time Calculation (min) 40 min    Activity Tolerance Patient tolerated treatment well    Behavior During Therapy [facility] for tasks assessed/performed           Past Medical History:  Diagnosis Date  . Alopecia   . Arthritis    "knees"  . Hematuria   . Hemochromatosis   . Hereditary hemochromatosis ([place]) monitored by PCP  dr [name] gates   history of phlebotomies -- last one 2014 (pt states body has normalized)  . History of bladder stone   . Hypertension   . Kidney stones   . Prostate cancer ([place])    UROLOGIST-- DR Diona Fanti; S/P PROSTATECTOMY 03-17-2008  . Right ureteral stone   . Rosacea     Past Surgical History:  Procedure Laterality Date  . APPENDECTOMY  1963  . COLONOSCOPY    . COMPLEX WOUND CLOSURE Left 03-26-2009   INDEX FINGER COMPLEX WOUND LACERATION REPAIR  . CYSTOLITHOLAPAXY AND URETHRAL DILATION  10-05-2009  . CYSTOSCOPY W/ RETROGRADES Right 10/11/2014   Procedure: CYSTOSCOPY WITH RETROGRADE PYELOGRAM;  Surgeon: Franchot Gallo, MD;  Location: [facility];  Service: Urology;  Laterality: Right;  . HOLMIUM LASER APPLICATION N/A 3/71/6967   Procedure: HOLMIUM LASER LITHOTRIPSY, ;  Surgeon: Franchot Gallo, MD;  Location: [facility];  Service: Urology;  Laterality: N/A;  . JOINT REPLACEMENT    . KNEE ARTHROSCOPY Left 1995  . LIPOMA EXCISION  03-04-2000   forehead  . ROBOT ASSISTED LAPAROSCOPIC RADICAL PROSTATECTOMY  03-17-2008   w/  BILATERAL PELVIC  LYMPHADENECTOMY (NON-NERVE SPARING)  . TOTAL KNEE ARTHROPLASTY Left 04/27/2015   Procedure: TOTAL KNEE ARTHROPLASTY;  Surgeon: [name], MD;  Location: [place];  Service: Orthopedics;  Laterality: Left;  . TOTAL KNEE ARTHROPLASTY Right 06/08/2015  . TOTAL KNEE ARTHROPLASTY Right 06/08/2015   Procedure: TOTAL RIGHT KNEE ARTHROPLASTY;  Surgeon: [name], MD;  Location: [place];  Service: Orthopedics;  Laterality: Right;    There were no vitals filed for this visit.   Subjective Assessment - 02/03/20 1021    Subjective overdid it with the tennis ball after last visit. it got better for a night but the same trigger points are still there.    Diagnostic tests Bilateral foraminal stenosis at those for levels that could result in radicular symptoms. related to either C4, C5, C6 or C7 nerve.                             [place] Adult PT Treatment/Exercise - 02/03/20 0001      Lumbar Exercises: Standing   Scapular Retraction Limitations leaning against wall    Other Standing Lumbar Exercises Rt GHJ ER green tband      Shoulder Exercises: Stretch   Cross Chest Stretch Limitations Right side 20s    Other Shoulder Stretches diagonal stretch on wall for infraspinatus & post deltoid      Manual Therapy  Manual therapy comments skilled palpation and monitoring during TPDN    Soft tissue mobilization STM to each muscle after DN            Trigger Point Dry Needling - 02/03/20 0001    Muscles Treated Head and Neck Upper trapezius    Muscles Treated Upper Quadrant Infraspinatus;Rhomboids    Upper Trapezius Response Twitch reponse elicited;Palpable increased muscle length   Rt   Rhomboids Response Twitch response elicited;Palpable increased muscle length   Rt   Infraspinatus Response Twitch response elicited;Palpable increased muscle length   Rt               PT Education - 02/03/20 1028    Education Details anatomy of condition & poc to determine cause v  symptoms            PT Short Term Goals - 01/22/20 1034      PT SHORT TERM GOAL #1   Title Patient will be independent with initial HEP    Status Achieved      PT SHORT TERM GOAL #2   Title Pt will demonstrate and verbalize knowledge of proper postural alignment    Status Achieved             PT Long Term Goals - 01/22/20 1034      PT LONG TERM GOAL #1   Title Patient will be able to achieve  5/5 gross LE strength    Status Unable to assess      PT LONG TERM GOAL #2   Title Patient will demonstrate bilateral symmetry with lumbar side bending in order to demonstrate a decrease in myofascial restrictions on the right side.    Status On-going      PT LONG TERM GOAL #3   Title Patient will report a >/= 25% decrease in sleep disturbances in order to improve quality of life    Status Achieved      PT LONG TERM GOAL #4   Title Patient will report a max 3/10 pain for >/= 1 week so that he is able to complete home renovations    Status Achieved                 Plan - 02/03/20 1144    Clinical Impression Statement TPDN to Rt shoulder region resulted in return of sensation to rt thumb and pt denied pain in stretching. We spent time discussing stenosis and sym;ptoms as well as POC in PT to determine long term care.    PT Treatment/Interventions ADLs/Self Care Home Management;Cryotherapy;Electrical Stimulation;Iontophoresis 4mg /ml Dexamethasone;Moist Heat;Traction;Ultrasound;Therapeutic exercise;Therapeutic activities;Functional mobility training;Neuromuscular re-education;Patient/family education;Dry needling;Passive range of motion;Manual techniques;Taping;Spinal Manipulations;Joint Manipulations    PT Next Visit Plan outcome of DN?    PT [place], add corner stretch    Consulted and Agree with Plan of Care Patient           Patient will benefit from skilled therapeutic intervention in order to improve the following deficits and impairments:   Increased muscle spasms, Decreased activity tolerance, Pain, Improper body mechanics, Impaired flexibility, Decreased strength, Postural dysfunction, Decreased endurance, Decreased mobility, Decreased range of motion, Increased fascial restricitons  Visit Diagnosis: Chronic bilateral low back pain without sciatica  Right shoulder pain, unspecified chronicity  Abnormal posture     Problem List Patient Active Problem List   Diagnosis Date Noted  . Acute upper respiratory infection 05/29/2018  . Allergic rhinitis 05/29/2018  . Anorexia 05/29/2018  . Artificial knee joint present 05/29/2018  .  Asthma without status asthmaticus 05/29/2018  . Cough 05/29/2018  . Disorder of iron metabolism 05/29/2018  . Encounter for general adult medical examination without abnormal findings 05/29/2018  . Fatigue 05/29/2018  . History of colon polyps 05/29/2018  . Inguinal hernia without obstruction or gangrene 05/29/2018  . Insomnia due to medical condition 05/29/2018  . Malignant neoplasm of prostate ([place]) 05/29/2018  . Osteoarthrosis involving more than one site but not generalized 05/29/2018  . Other long term (current) drug therapy 05/29/2018  . Other specified extrapyramidal and movement disorders 05/29/2018  . Pain in right knee 05/29/2018  . Pure hypercholesterolemia 05/29/2018  . Dextroscoliosis 08/01/2015  . Essential hypertension 06/30/2015  . Family history of heart disease 06/30/2015  . Left ventricular hypertrophy due to hypertensive disease 06/30/2015  . Left ventricular diastolic dysfunction, NYHA class 1 06/30/2015  . Fast heart beat 05/11/2015  . DJD (degenerative joint disease) of knee 04/27/2015    Annasophia Crocker C. Kameryn Tisdel PT, DPT 02/03/20 11:48 AM   [facility] [address] [place], [place], [address] Phone: [phone]   Fax:  [phone]  Name: Randall Bradley MRN: [id number] Date of Birth: [birth year]

## 2020-02-10 ENCOUNTER — Ambulatory Visit: Payer: BLUE CROSS/BLUE SHIELD | Admitting: Physical Therapy

## 2020-02-10 ENCOUNTER — Encounter: Payer: Self-pay | Admitting: Physical Therapy

## 2020-02-10 ENCOUNTER — Other Ambulatory Visit: Payer: Self-pay

## 2020-02-10 DIAGNOSIS — M545 Low back pain: Secondary | ICD-10-CM | POA: Diagnosis not present

## 2020-02-10 DIAGNOSIS — M25511 Pain in right shoulder: Secondary | ICD-10-CM

## 2020-02-10 DIAGNOSIS — G8929 Other chronic pain: Secondary | ICD-10-CM

## 2020-02-10 DIAGNOSIS — R293 Abnormal posture: Secondary | ICD-10-CM

## 2020-02-10 NOTE — Therapy (Signed)
[facility] [address] [place], [place], [address] Phone: [phone]   Fax:  [phone]  Physical Therapy Treatment/Discharge  Patient Details  Name: Randall Bradley MRN: [id number] Date of Birth: 02-12-1956 Referring Provider (PT): Marden Noble, MD   Encounter Date: 02/10/2020   PT End of Session - 02/10/20 1103    Visit Number 9    Number of Visits 13    Date for PT Re-Evaluation 02/19/20    Authorization Type BCBS    PT Start Time 1105    PT Stop Time 1120    PT Time Calculation (min) 15 min    Activity Tolerance Patient tolerated treatment well    Behavior During Therapy [facility] for tasks assessed/performed           Past Medical History:  Diagnosis Date  . Alopecia   . Arthritis    "knees"  . Hematuria   . Hemochromatosis   . Hereditary hemochromatosis ([facility]) monitored by PCP  dr Molly Maduro gates   history of phlebotomies -- last one 2014 (pt states body has normalized)  . History of bladder stone   . Hypertension   . Kidney stones   . Prostate cancer ([facility])    UROLOGIST-- DR Retta Diones; S/P PROSTATECTOMY 03-17-2008  . Right ureteral stone   . Rosacea     Past Surgical History:  Procedure Laterality Date  . APPENDECTOMY  1963  . COLONOSCOPY    . COMPLEX WOUND CLOSURE Left 03-26-2009   INDEX FINGER COMPLEX WOUND LACERATION REPAIR  . CYSTOLITHOLAPAXY AND URETHRAL DILATION  10-05-2009  . CYSTOSCOPY W/ RETROGRADES Right 10/11/2014   Procedure: CYSTOSCOPY WITH RETROGRADE PYELOGRAM;  Surgeon: Marcine Matar, MD;  Location: [facility];  Service: Urology;  Laterality: Right;  . HOLMIUM LASER APPLICATION N/A 10/11/2014   Procedure: HOLMIUM LASER LITHOTRIPSY, ;  Surgeon: Marcine Matar, MD;  Location[facility];  Service: Urology;  Laterality: N/A;  . JOINT REPLACEMENT    . KNEE ARTHROSCOPY Left 1995  . LIPOMA EXCISION  03-04-2000   forehead  . ROBOT ASSISTED LAPAROSCOPIC RADICAL  PROSTATECTOMY  03-17-2008   w/  BILATERAL PELVIC LYMPHADENECTOMY (NON-NERVE SPARING)  . TOTAL KNEE ARTHROPLASTY Left 04/27/2015   Procedure: TOTAL KNEE ARTHROPLASTY;  Surgeon: Loreta Ave, MD;  [facility];  Service: Orthopedics;  Laterality: Left;  . TOTAL KNEE ARTHROPLASTY Right 06/08/2015  . TOTAL KNEE ARTHROPLASTY Right 06/08/2015   Procedure: TOTAL RIGHT KNEE ARTHROPLASTY;  Surgeon: Loreta Ave, MD;  Location: [facility];  Service: Orthopedics;  Laterality: Right;    There were no vitals filed for this visit.   Subjective Assessment - 02/10/20 1106    Subjective Went back on prednisone, went back to arm on fire and cannot sleep. I am going for surgical consult to see what my options are. Otherwise I feel really good and I feel like we have done all we can here.    Diagnostic tests Bilateral foraminal stenosis at those for levels that could result in radicular symptoms. related to either C4, C5, C6 or C7 nerve.    Patient Stated Goals Decrease pain    Currently in Pain? No/denies              [facility] PT Assessment - 02/10/20 0001      Assessment   Medical Diagnosis Lower back pain     Referring Provider (PT) Marden Noble, MD      Observation/Other Assessments   Focus on Therapeutic Outcomes (FOTO)  6% limited      Strength   Overall Strength Comments gross 5/5                                 PT Education - 02/10/20 1125    Education Details surgical consult, FOTO, goals, importance of continued HEP    Person(s) Educated Patient    Methods Explanation    Comprehension Verbalized understanding            PT Short Term Goals - 01/22/20 1034      PT SHORT TERM GOAL #1   Title Patient will be independent with initial HEP    Status Achieved      PT SHORT TERM GOAL #2   Title Pt will demonstrate and verbalize knowledge of proper postural alignment    Status Achieved             PT Long Term Goals - 02/10/20 1127      PT LONG TERM  GOAL #1   Title Patient will be able to achieve  5/5 gross LE strength    Status Achieved      PT LONG TERM GOAL #2   Title Patient will demonstrate bilateral symmetry with lumbar side bending in order to demonstrate a decrease in myofascial restrictions on the right side.    Status Achieved      PT LONG TERM GOAL #3   Title Patient will report a >/= 25% decrease in sleep disturbances in order to improve quality of life    Baseline not disturbed due to low back, Rt UE limits sleep    Status Achieved      PT LONG TERM GOAL #4   Title Patient will report a max 3/10 pain for >/= 1 week so that he is able to complete home renovations    Status Achieved                 Plan - 02/10/20 1125    Clinical Impression Statement Pt has met his goals at this time and is prepared for d/c to independent program. He reports he is feeling really good when considering what we have addressed. He continues to have the burning, severe pain in his arm when he tries to sleep and is going for a surgical consult which I feel is very appropriate at this time. Pt was encouraged to contact us with any further questions.    PT Treatment/Interventions ADLs/Self Care Home Management;Cryotherapy;Electrical Stimulation;Iontophoresis 4mg /ml Dexamethasone;Moist Heat;Traction;Ultrasound;Therapeutic exercise;Therapeutic activities;Functional mobility training;Neuromuscular re-education;Patient/family education;Dry needling;Passive range of motion;Manual techniques;Taping;Spinal Manipulations;Joint Manipulations    PT Home Exercise Plan [id number], add corner stretch    Consulted and Agree with Plan of Care Patient           Patient will benefit from skilled therapeutic intervention in order to improve the following deficits and impairments:  Increased muscle spasms, Decreased activity tolerance, Pain, Improper body mechanics, Impaired flexibility, Decreased strength, Postural dysfunction, Decreased endurance, Decreased  mobility, Decreased range of motion, Increased fascial restricitons  Visit Diagnosis: Chronic bilateral low back pain without sciatica  Right shoulder pain, unspecified chronicity  Abnormal posture     Problem List Patient Active Problem List   Diagnosis Date Noted  . Acute upper respiratory infection 05/29/2018  . Allergic rhinitis 05/29/2018  . Anorexia 05/29/2018  . Artificial knee joint present 05/29/2018  . Asthma without status asthmaticus 05/29/2018  . Cough 05/29/2018  . Disorder  of iron metabolism 05/29/2018  . Encounter for general adult medical examination without abnormal findings 05/29/2018  . Fatigue 05/29/2018  . History of colon polyps 05/29/2018  . Inguinal hernia without obstruction or gangrene 05/29/2018  . Insomnia due to medical condition 05/29/2018  . Malignant neoplasm of prostate ([place]) 05/29/2018  . Osteoarthrosis involving more than one site but not generalized 05/29/2018  . Other long term (current) drug therapy 05/29/2018  . Other specified extrapyramidal and movement disorders 05/29/2018  . Pain in right knee 05/29/2018  . Pure hypercholesterolemia 05/29/2018  . Dextroscoliosis 08/01/2015  . Essential hypertension 06/30/2015  . Family history of heart disease 06/30/2015  . Left ventricular hypertrophy due to hypertensive disease 06/30/2015  . Left ventricular diastolic dysfunction, NYHA class 1 06/30/2015  . Fast heart beat 05/11/2015  . DJD (degenerative joint disease) of knee 04/27/2015  PHYSICAL THERAPY DISCHARGE SUMMARY  Visits from Start of Care: 9  Current functional level related to goals / functional outcomes: See above   Remaining deficits: See above   Education / Equipment: Anatomy of condition, POC, HEP, exercise form/rationale  Plan: Patient agrees to discharge.  Patient goals were met. Patient is being discharged due to meeting the stated rehab goals.  ?????      Abygail Galeno C. Mory Herrman PT, DPT 02/10/20 11:28  AM   [facility] [address] [place], [place], [address] Phone: [phone]   Fax:  [phone]  Name: Randall Bradley MRN: [id number] Date of Birth: Feb 08, 1956

## 2020-02-17 ENCOUNTER — Ambulatory Visit: Payer: BLUE CROSS/BLUE SHIELD | Admitting: Physical Therapy

## 2020-03-15 ENCOUNTER — Other Ambulatory Visit: Payer: Self-pay | Admitting: *Deleted

## 2020-03-16 ENCOUNTER — Other Ambulatory Visit: Payer: Self-pay

## 2020-03-16 ENCOUNTER — Inpatient Hospital Stay: Payer: BLUE CROSS/BLUE SHIELD | Attending: Hematology & Oncology | Admitting: Hematology & Oncology

## 2020-03-16 ENCOUNTER — Inpatient Hospital Stay: Payer: BLUE CROSS/BLUE SHIELD

## 2020-03-16 ENCOUNTER — Encounter: Payer: Self-pay | Admitting: Hematology & Oncology

## 2020-03-16 DIAGNOSIS — Z79899 Other long term (current) drug therapy: Secondary | ICD-10-CM | POA: Insufficient documentation

## 2020-03-16 DIAGNOSIS — Z7952 Long term (current) use of systemic steroids: Secondary | ICD-10-CM | POA: Diagnosis not present

## 2020-03-16 HISTORY — DX: Hereditary hemochromatosis: E83.110

## 2020-03-16 LAB — CBC WITH DIFFERENTIAL (CANCER CENTER ONLY)
Abs Immature Granulocytes: 0.01 10*3/uL (ref 0.00–0.07)
Basophils Absolute: 0 10*3/uL (ref 0.0–0.1)
Basophils Relative: 1 %
Eosinophils Absolute: 0.1 10*3/uL (ref 0.0–0.5)
Eosinophils Relative: 2 %
HCT: 42.4 % (ref 39.0–52.0)
Hemoglobin: 13.9 g/dL (ref 13.0–17.0)
Immature Granulocytes: 0 %
Lymphocytes Relative: 41 %
Lymphs Abs: 1.9 10*3/uL (ref 0.7–4.0)
MCH: 28.3 pg (ref 26.0–34.0)
MCHC: 32.8 g/dL (ref 30.0–36.0)
MCV: 86.2 fL (ref 80.0–100.0)
Monocytes Absolute: 0.5 10*3/uL (ref 0.1–1.0)
Monocytes Relative: 11 %
Neutro Abs: 2.1 10*3/uL (ref 1.7–7.7)
Neutrophils Relative %: 45 %
Platelet Count: 229 10*3/uL (ref 150–400)
RBC: 4.92 MIL/uL (ref 4.22–5.81)
RDW: 12.4 % (ref 11.5–15.5)
WBC Count: 4.7 10*3/uL (ref 4.0–10.5)
nRBC: 0 % (ref 0.0–0.2)

## 2020-03-16 LAB — CMP (CANCER CENTER ONLY)
ALT: 14 U/L (ref 0–44)
AST: 18 U/L (ref 15–41)
Albumin: 4.3 g/dL (ref 3.5–5.0)
Alkaline Phosphatase: 52 U/L (ref 38–126)
Anion gap: 8 (ref 5–15)
BUN: 27 mg/dL — ABNORMAL HIGH (ref 8–23)
CO2: 30 mmol/L (ref 22–32)
Calcium: 9.9 mg/dL (ref 8.9–10.3)
Chloride: 102 mmol/L (ref 98–111)
Creatinine: 1.11 mg/dL (ref 0.61–1.24)
GFR, Est AFR Am: >60 mL/min (ref >60)
GFR, Estimated: >60 mL/min (ref >60)
Glucose, Bld: 100 mg/dL — ABNORMAL HIGH (ref 70–99)
Potassium: 3.7 mmol/L (ref 3.5–5.1)
Sodium: 140 mmol/L (ref 135–145)
Total Bilirubin: 0.3 mg/dL (ref 0.3–1.2)
Total Protein: 7 g/dL (ref 6.5–8.1)

## 2020-03-16 NOTE — Progress Notes (Signed)
Randall Bradley presents today for phlebotomy per MD orders - MD aware iron studies not resulted. He ordered US to proceed for maintenance and pt agreed. Phlebotomy procedure started at 1347 and ended at 1354 with 550 grams removed via 16 G via R Arm . Patient declined to be observed for 30 minutes after procedure - feels well, VS taken. Patient tolerated procedure well. Diet and nutrition offered.

## 2020-03-16 NOTE — Progress Notes (Signed)
Hematology and Oncology Follow Up Visit  Randall Bradley [id number] February 11, 1956 64 y.o. 03/16/2020   Principle Diagnosis:   Hereditary hemochromatosis (C282Y Homozygous)  Current Therapy:    Phlebotomy/blood donation to maintain ferritin less than 50 and iron saturation less than 30%     Interim History:  Randall Bradley is back for follow-up.  This is his second office visit.  We first saw him 6 months ago.  More we first saw him, his iron studies really were not all that bad.  His ferritin was 32 with an iron saturation of 42%.  Thought that he would be a good person to donate blood.  I think that he had been donating blood.  However, he was just informed that his blood was not suitable because he had a Hepatitis B antibodies.  He has had the Hepatitis B vaccine because he went to [place].  Otherwise, he feels okay.  He has had no problems with nausea or vomiting.  There has been no fatigue or weakness.  He has had no cough or shortness of breath.  He has had no rashes.  He has had no change in bowel or bladder habits.  He is quite active.  Overall, his performance status is ECOG 0.  Medications:  Current Outpatient Medications:  .  ampicillin (PRINCIPEN) 500 MG capsule, Take 500 mg by mouth 2 (two) times daily., Disp: , Rfl:  .  BREO ELLIPTA 100-25 MCG/INH AEPB, Inhale 1 puff into the lungs daily., Disp: , Rfl:  .  cyproheptadine (PERIACTIN) 4 MG tablet, Take 4 mg by mouth 2 (two) times daily as needed for allergies., Disp: , Rfl:  .  finasteride (PROPECIA) 1 MG tablet, Take 1 mg by mouth daily., Disp: , Rfl:  .  finasteride (PROSCAR) 5 MG tablet, Take 1 tablet by mouth daily., Disp: , Rfl:  .  fluticasone furoate-vilanterol (BREO ELLIPTA) 100-25 MCG/INH AEPB, Breo Ellipta 100 mcg-25 mcg/dose powder for inhalation  1 INHALATION DAILY AS NEEDED FOR COUGH AND WHEEZE, Disp: , Rfl:  .  fluticasone furoate-vilanterol (BREO ELLIPTA) 100-25 MCG/INH AEPB, as needed., Disp: , Rfl:  .   fluticasone furoate-vilanterol (BREO ELLIPTA) 200-25 MCG/INH AEPB, 1 inhalation, Disp: , Rfl:  .  gabapentin (NEURONTIN) 300 MG capsule, Take by mouth., Disp: , Rfl:  .  hydrochlorothiazide (HYDRODIURIL) 12.5 MG tablet, Take 1 tablet by mouth every morning., Disp: , Rfl:  .  ivermectin (STROMECTOL) 3 MG TABS tablet, Take by mouth., Disp: , Rfl:  .  losartan (COZAAR) 25 MG tablet, Take 25 mg by mouth daily., Disp: , Rfl:  .  methylPREDNISolone (MEDROL DOSEPAK) 4 MG TBPK tablet, See admin instructions., Disp: , Rfl:  .  Multiple Vitamin (MULTIVITAMIN WITH MINERALS) TABS tablet, Take 1 tablet by mouth daily. Reported on 06/28/2015, Disp: , Rfl:  .  omeprazole (PRILOSEC) 40 MG capsule, 1 capsule, Disp: , Rfl:  .  predniSONE (DELTASONE) 10 MG tablet, Take 5 tablets daily for 3 days and then decrease by one tablet every three days until gone, Disp: , Rfl:  .  predniSONE (DELTASONE) 10 MG tablet, PLEASE SEE ATTACHED FOR DETAILED DIRECTIONS, Disp: , Rfl:  .  traMADol (ULTRAM) 50 MG tablet, Take by mouth., Disp: , Rfl:  .  VITAMIN D PO, Take by mouth., Disp: , Rfl:  .  aspirin EC 81 MG tablet, Take 81 mg by mouth daily. (Patient not taking: Reported on 12/21/2019), Disp: , Rfl:   Allergies:  Allergies  Allergen Reactions  . Ropinirole  Hcl Other (See Comments)    diffuse pruritus    Past Medical History, Surgical history, Social history, and Family History were reviewed and updated.  Review of Systems: Review of Systems  Constitutional: Negative.   HENT:  Negative.   Eyes: Negative.   Respiratory: Negative.   Cardiovascular: Negative.   Gastrointestinal: Negative.   Endocrine: Negative.   Genitourinary: Negative.    Musculoskeletal: Negative.   Skin: Negative.   Neurological: Negative.   Hematological: Negative.   Psychiatric/Behavioral: Negative.     Physical Exam:  weight is 204 lb 4 oz (92.6 kg). His oral temperature is 97.7 F (36.5 C). His blood pressure is 130/93 (abnormal) and his  pulse is 71. His respiration is 18 and oxygen saturation is 100%.   Wt Readings from Last 3 Encounters:  03/16/20 204 lb 4 oz (92.6 kg)  12/21/19 200 lb (90.7 kg)  08/28/19 196 lb (88.9 kg)    Physical Exam Vitals reviewed.  HENT:     Head: Normocephalic and atraumatic.  Eyes:     Pupils: Pupils are equal, round, and reactive to light.  Cardiovascular:     Rate and Rhythm: Normal rate and regular rhythm.     Heart sounds: Normal heart sounds.  Pulmonary:     Effort: Pulmonary effort is normal.     Breath sounds: Normal breath sounds.  Abdominal:     General: Bowel sounds are normal.     Palpations: Abdomen is soft.  Musculoskeletal:        General: No tenderness or deformity. Normal range of motion.     Cervical back: Normal range of motion.  Lymphadenopathy:     Cervical: No cervical adenopathy.  Skin:    General: Skin is warm and dry.     Findings: No erythema or rash.  Neurological:     Mental Status: He is alert and oriented to person, place, and time.  Psychiatric:        Behavior: Behavior normal.        Thought Content: Thought content normal.        Judgment: Judgment normal.      Lab Results  Component Value Date   WBC 4.7 03/16/2020   HGB 13.9 03/16/2020   HCT 42.4 03/16/2020   MCV 86.2 03/16/2020   PLT 229 03/16/2020     Chemistry      Component Value Date/Time   NA 140 03/16/2020 1428   K 3.7 03/16/2020 1428   CL 102 03/16/2020 1428   CO2 30 03/16/2020 1428   BUN 27 (H) 03/16/2020 1428   CREATININE 1.11 03/16/2020 1428   CREATININE 0.84 10/08/2016 1423      Component Value Date/Time   CALCIUM 9.9 03/16/2020 1428   ALKPHOS 52 03/16/2020 1428   AST 18 03/16/2020 1428   ALT 14 03/16/2020 1428   BILITOT 0.3 03/16/2020 1428      Impression and Plan: Randall Bradley is a very nice 64 year old white male.  He has hereditary hemochromatosis.  He is doing well.  We will go ahead and phlebotomize him today.  Again I am not sure why I am his blood  cannot be used for donations.  We will see what his iron studies look like.  This will then allow [place] to figure out when we need to get him back.  If he cannot donate blood for donations to the [employer], I does feel this would be a real waste of good blood.  I probably will plan  to see him back in another 6 months.   [name], MD 9/22/20213:41 PM

## 2020-03-17 ENCOUNTER — Telehealth: Payer: Self-pay

## 2020-03-17 ENCOUNTER — Telehealth: Payer: Self-pay | Admitting: Hematology & Oncology

## 2020-03-17 LAB — FERRITIN: Ferritin: 18 ng/mL — ABNORMAL LOW (ref 24–336)

## 2020-03-17 LAB — IRON AND TIBC
Iron: 65 ug/dL (ref 42–163)
Saturation Ratios: 20 % (ref 20–55)
TIBC: 324 ug/dL (ref 202–409)
UIBC: 259 ug/dL (ref 117–376)

## 2020-03-17 NOTE — Telephone Encounter (Signed)
The following message provided to pt via phone per Dr Marin Olp request:   "the iron levels are fantastic!! No need for phlebotomy for at least 6 months!!! Randall Bradley"  Patient verbalizes understanding and appreciation. dph

## 2020-03-17 NOTE — Telephone Encounter (Signed)
Appointments scheduled calendr printed & mailed per 9/22 los

## 2020-05-31 ENCOUNTER — Other Ambulatory Visit: Payer: Self-pay | Admitting: Cardiovascular Disease

## 2020-06-06 ENCOUNTER — Ambulatory Visit (INDEPENDENT_AMBULATORY_CARE_PROVIDER_SITE_OTHER): Payer: BLUE CROSS/BLUE SHIELD | Admitting: Cardiovascular Disease

## 2020-06-06 ENCOUNTER — Encounter: Payer: Self-pay | Admitting: Cardiovascular Disease

## 2020-06-06 ENCOUNTER — Other Ambulatory Visit: Payer: Self-pay

## 2020-06-06 DIAGNOSIS — I10 Essential (primary) hypertension: Secondary | ICD-10-CM | POA: Diagnosis not present

## 2020-06-06 DIAGNOSIS — I119 Hypertensive heart disease without heart failure: Secondary | ICD-10-CM

## 2020-06-06 DIAGNOSIS — I5189 Other ill-defined heart diseases: Secondary | ICD-10-CM | POA: Diagnosis not present

## 2020-06-06 DIAGNOSIS — E782 Mixed hyperlipidemia: Secondary | ICD-10-CM

## 2020-06-06 DIAGNOSIS — Z8249 Family history of ischemic heart disease and other diseases of the circulatory system: Secondary | ICD-10-CM

## 2020-06-06 DIAGNOSIS — Z8546 Personal history of malignant neoplasm of prostate: Secondary | ICD-10-CM

## 2020-06-06 NOTE — Progress Notes (Signed)
Patient ID: Randall Bradley, male   DOB: 06-23-1956, 64 y.o.   MRN: [id number]     Primary MD: Dr. Inda Merlin  PATIENT PROFILE: Randall Bradley is a 64 y.o. male who was initially self-referred due to a significant family history for CAD, and recently being told of having diastolic dysfunction.  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  He presents for a 6 month follow-up evaluation.  HPI:  Randall Bradley denies any known history of prior heart disease.  He has a history of hypertension for a ~4 years for which most recently he has been on losartan at 50 mg daily.  He also has a history of rosacea for which he takes minocycline 100 mg every evening.  He takes finasteride for hair loss.  In November 2016, he underwent initial left knee replacement surgery which he tolerated well.  However, he was told of having an increased heart rate in the postoperative period.  When I review the hospital records,  heart rates were in the 90s without any incidences of documented arrhythmia or significant ectopy.  He subsequently saw his primary physician, Dr. Mertha Finders, who referred him for an echo Doppler study which was done on 05/24/2015.  This revealed an ejection fraction of 55-60%.  There was evidence for mild left ventricular hypertrophy with normal systolic function.  Diastolic parameters suggested mild grade 1 diastolic dysfunction.  There was evidence for trivial mitral regurgitation, trivial tricuspid regurgitation and trivial pulmonic regurgitation.  PA pressure was normal.  On 06/08/2015 he underwent right total knee replacement and tolerated this well with no mention of cardiac arrhythmia in the postoperative period. He has a strong family history for heart disease with a father having suffered multiple myocardial infarctions and died at age 28.  He has a past medical history significant for radical prostatectomy in 2007 prostate cancer. He is followed by Dr. Buford Dresser for urologic care.  He also has a history  of hemochromatosis.   When I saw him in February 2018 he was remaining stable  from a cardiac standpoint.  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 He has several older cows for breeding.   I saw him in March 2019 and over the previous year he had remained very active.  During that evaluation he denied anyhest pain, PND, orthopnea.  He denied palpitations.  Since his knee replacements he has no restrictions in his movement.  He denied any difficulty with sleep. He had the flu in December.  He works out at least 3 days per week.   Augmentin June 2021.  Since his previous evaluation with me he had gone to give blood donation in February 2020 and at that time he was told that he had tested positive with Covid antibodies.  As result in retrospect he seems he may have had Covid instead of the "flu" in December 2019 prior to its awareness in the [place].  Over the past several years, he ultimately had an offer and sold his house and farm where he raised calves just outside [place].  He has now moved back to [place].  He recently was evaluated by Dr. Burney Gauze for hemochromatosis and has undergone genetic testing which is still pending.  He denied chest pain or shortness of breath.  He remained active doing yard work, walking, and biking.  During my evaluation, I reviewed lipid studies which were done in 2020 which showed an LDL of 96 and elevated triglycerides at 214.  He was asymptomatic.  With his very strong family history I recommended he undergo a cardiac calcium score evaluation for risk stratification.  He was following up with Dr. Diona Fanti with reference to his prostate CA.  Over the past 6 months, he has continued to do well.  He never had a coronary calcium score done to date.  He continues to be active living in [place]  ensures and walks 3 to 5 miles per day without chest pain or shortness of breath.  He has been seeing Dr. Inda Merlin for hand numbness.  Often he does not sleep well because of bilateral hand and arm discomfort.  He had undergone C3-4-5 disc surgery by Dr. Grayland Ormond at [facility].  Lipid studies in July 2021 showed an LDL cholesterol at 132.  He is not on treatment.  He is on losartan 25 mg and hydrochlorothiazide 12.5 mg for hypertension.  He presents for evaluation  Past Medical History:  Diagnosis Date  . Alopecia   . Arthritis    "knees"  . Hematuria   . Hemochromatosis   . Hemochromatosis, hereditary ([place]) 03/16/2020  . Hereditary hemochromatosis ([place]) monitored by PCP  dr [name] gates   history of phlebotomies -- last one 2014 (pt states body has normalized)  . History of bladder stone   . Hypertension   . Kidney stones   . Prostate cancer ([place])    UROLOGIST-- DR Diona Fanti; S/P PROSTATECTOMY 03-17-2008  . Right ureteral stone   . Rosacea     Past Surgical History:  Procedure Laterality Date  . APPENDECTOMY  1963  . COLONOSCOPY    . COMPLEX WOUND CLOSURE Left 03-26-2009   INDEX FINGER COMPLEX WOUND LACERATION REPAIR  . CYSTOLITHOLAPAXY AND URETHRAL DILATION  10-05-2009  . CYSTOSCOPY W/ RETROGRADES Right 10/11/2014   Procedure: CYSTOSCOPY WITH RETROGRADE PYELOGRAM;  Surgeon: Franchot Gallo, MD;  Location: [facility];  Service: Urology;  Laterality: Right;  . HOLMIUM LASER APPLICATION N/A 6/31/4970   Procedure: HOLMIUM LASER LITHOTRIPSY, ;  Surgeon: Franchot Gallo, MD;  Location: [facility];  Service: Urology;  Laterality: N/A;  . JOINT REPLACEMENT    . KNEE ARTHROSCOPY Left 1995  . LIPOMA EXCISION  03-04-2000   forehead  . ROBOT ASSISTED LAPAROSCOPIC RADICAL PROSTATECTOMY  03-17-2008   w/  BILATERAL PELVIC LYMPHADENECTOMY (NON-NERVE SPARING)  . TOTAL KNEE ARTHROPLASTY Left 04/27/2015   Procedure: TOTAL KNEE ARTHROPLASTY;  Surgeon: [name], MD;  Location: [facility];  Service: Orthopedics;  Laterality: Left;  . TOTAL KNEE ARTHROPLASTY Right 06/08/2015  . TOTAL KNEE ARTHROPLASTY Right 06/08/2015   Procedure: TOTAL RIGHT KNEE ARTHROPLASTY;  Surgeon: [name], MD;  Location: [facility];  Service: Orthopedics;  Laterality: Right;    Allergies  Allergen Reactions  . Ropinirole Hcl Other (See Comments)    diffuse pruritus    Current Outpatient Medications  Medication Sig Dispense Refill  . ampicillin (PRINCIPEN) 500 MG capsule Take 500 mg by mouth 2 (two) times daily.    Marland Kitchen aspirin EC 81 MG tablet Take 81 mg by mouth daily.    Marland Kitchen BREO ELLIPTA 100-25 MCG/INH AEPB Inhale 1 puff into the lungs daily.    . cyproheptadine (PERIACTIN) 4 MG tablet Take 4 mg by mouth 2 (two) times daily as needed for  allergies.    . finasteride (PROSCAR) 5 MG tablet Take 1 tablet by mouth daily.    . hydrochlorothiazide (HYDRODIURIL) 12.5 MG tablet Take 1 tablet by mouth every morning.    . ivermectin (STROMECTOL) 3 MG TABS tablet Take by mouth.    . losartan (COZAAR) 25 MG tablet Take 25 mg by mouth daily.    . Multiple Vitamin (MULTIVITAMIN WITH MINERALS) TABS tablet Take 1 tablet by mouth daily. Reported on 06/28/2015    . VITAMIN D PO Take by mouth.     No current facility-administered medications for this visit.    Social History   Socioeconomic History  . Marital status: Married    Spouse name: Not on file  . Number of children: Not on file  . Years of education: Not on file  . Highest education level: Not on file  Occupational History  . Not on file  Tobacco Use  . Smoking status: Former Smoker    Packs/day: 0.50    Years: 15.00    Pack years: 7.50    Types: Cigarettes    Quit date: 10/07/1993    Years since quitting: 26.6  . Smokeless tobacco: Never Used  Vaping Use  . Vaping Use: Never used  Substance and Sexual Activity  . Alcohol use: Yes    Comment: "I quit drinking in ~ 2004"  . Drug use: No  . Sexual activity:  Never  Other Topics Concern  . Not on file  Social History Narrative  . Not on file   Social Determinants of Health   Financial Resource Strain: Not on file  Food Insecurity: Not on file  Transportation Needs: Not on file  Physical Activity: Not on file  Stress: Not on file  Social Connections: Not on file  Intimate Partner Violence: Not on file   Additional social history is notable that he is single.  Ther remote tobacco history.  He previously had worked for [employer], [employer] in [place], and spent 2 years in [place] in [employer].  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    Family history is notable that his father died at 2, but it suffered multiple myocardial infarctions.  His mother died at 19 and had undergone some 2 CABG operations.  He has 3 brothers with hypertension.  One brother also has daily prostate and thyroid cancer.  ROS General: Negative; No fevers, chills, or night sweats HEENT: Negative; No changes in vision or hearing, sinus congestion, difficulty swallowing Pulmonary: Negative; No cough, wheezing, shortness of breath, hemoptysis Cardiovascular:  See HPI; No chest pain, presyncope, syncope, palpitations, edema GI: Negative; No nausea, vomiting, diarrhea, or abdominal pain GU: Negative; No dysuria, hematuria, or difficulty voiding Musculoskeletal: Negative; no myalgias, joint pain, or weakness Hematologic/Oncologic: Negative; no easy bruising, bleeding Endocrine: Negative; no heat/cold intolerance; no diabetes Neuro: Negative; no changes in balance, headaches Skin: Negative; No rashes or skin lesions Psychiatric: Negative; No behavioral problems, depression Sleep: Negative; No daytime sleepiness, hypersomnolence, bruxism, restless legs, hypnogagnic hallucinations Other comprehensive 14 point system review is negative   Physical Exam BP 134/83   Pulse 69   Ht $R'6\' 2"'CL$  (1.88 m)    Wt 207 lb 3.2 oz (94 kg)   SpO2 99%   BMI 26.60 kg/m   Repeat blood pressure by me was 124/80  Wt Readings from Last 3 Encounters:  06/06/20 207 lb 3.2 oz (94 kg)  03/16/20 204 lb 4 oz (  92.6 kg)  12/21/19 200 lb (90.7 kg)    General: Alert, oriented, no distress.  Skin: normal turgor, no rashes, warm and dry HEENT: Normocephalic, atraumatic. Pupils equal round and reactive to light; sclera anicteric; extraocular muscles intact;  Nose without nasal septal hypertrophy Mouth/Parynx benign; Mallinpatti scale 3 Neck: No JVD, no carotid bruits; normal carotid upstroke Lungs: clear to ausculatation and percussion; no wheezing or rales Chest wall: without tenderness to palpitation Heart: PMI not displaced, RRR, s1 s2 normal, 1/6 systolic murmur, no diastolic murmur, no rubs, gallops, thrills, or heaves Abdomen: soft, nontender; no hepatosplenomehaly, BS+; abdominal aorta nontender and not dilated by palpation. Back: no CVA tenderness Pulses 2+ Musculoskeletal: full Bradley of motion, normal strength, no joint deformities Extremities: no clubbing cyanosis or edema, Homan's sign negative  Neurologic: grossly nonfocal; Cranial nerves grossly wnl Psychologic: Normal mood and affect   ECG (independently read by me): Normal sinus rhythm at 69 bpm.  No ectopy.  Early transition.  Normal intervals.  December 21, 1998 ECG (independently read by me): NSR at 67; no ectopy, normal intervals  March 2019 ECG (independently read by me): normal sinus rhythm at 69 bpm.  No ectopy.  Normal intervals.  No ST segment changes.  February 2018 ECG (independently read by me): Normal sinus rhythm at 75 bpm.  Normal intervals.  No ST segment changes.  No ectopy.  January 2017 ECG (independently read by me): Normal sinus rhythm 81 bpm.  Normal intervals.  Early transition.  LABS:  BMP Latest Ref Rng & Units 03/16/2020 08/28/2019 10/08/2016  Glucose 70 - 99 mg/dL 100(H) 105(H) 92  BUN 8 - 23 mg/dL 27(H) 26(H) 25   Creatinine 0.61 - 1.24 mg/dL 1.11 1.17 0.84  Sodium 135 - 145 mmol/L 140 142 143  Potassium 3.5 - 5.1 mmol/L 3.7 4.6 4.5  Chloride 98 - 111 mmol/L 102 105 106  CO2 22 - 32 mmol/L $RemoveB'30 30 23  'BxDrnufo$ Calcium 8.9 - 10.3 mg/dL 9.9 9.4 9.2     Hepatic Function Latest Ref Rng & Units 03/16/2020 08/28/2019 10/08/2016  Total Protein 6.5 - 8.1 g/dL 7.0 6.8 6.5  Albumin 3.5 - 5.0 g/dL 4.3 4.2 3.9  AST 15 - 41 U/L $Remo'18 16 19  'jinac$ ALT 0 - 44 U/L $Remo'14 12 15  'uhwnA$ Alk Phosphatase 38 - 126 U/L 52 51 67  Total Bilirubin 0.3 - 1.2 mg/dL 0.3 0.5 0.5    CBC Latest Ref Rng & Units 03/16/2020 08/28/2019 12/02/2018  WBC 4.0 - 10.5 K/uL 4.7 5.1 -  Hemoglobin 13.0 - 17.0 g/dL 13.9 12.6(L) 13.9  Hematocrit 39.0 - 52.0 % 42.4 37.9(L) -  Platelets 150 - 400 K/uL 229 220 -   Lab Results  Component Value Date   MCV 86.2 03/16/2020   MCV 86.7 08/28/2019   MCV 88.9 10/08/2016   Lab Results  Component Value Date   TSH 3.00 10/08/2016   No results found for: HGBA1C   BNP No results found for: BNP  ProBNP No results found for: PROBNP   Lipid Panel     Component Value Date/Time   CHOL 156 10/08/2016 1423   TRIG 128 10/08/2016 1423   HDL 48 10/08/2016 1423   CHOLHDL 3.3 10/08/2016 1423   VLDL 26 10/08/2016 1423   LDLCALC 82 10/08/2016 1423    RADIOLOGY: No results found.   IMPRESSION:  1. Essential hypertension   2. Hypertensive left ventricular hypertrophy, without heart failure   3. Left ventricular diastolic dysfunction, NYHA class 1  4. Mixed hyperlipidemia   5. Family history of heart disease   6. Hereditary hemochromatosis ([place])   7. History of prostate cancer    ASSESSMENT AND PLAN: Mr. Randall Bradley is a 64 year old white male who has a history of hypertension and significant family history for CAD. He has been demonstrated to have normal systolic function with moderate left ventricular hypertrophy and evidence for grade 1 diastolic dysfunction on echo Doppler.  His blood pressure today is stable on  low-dose losartan at 25 mg and HCTZ 12.5 mg.  He had undergone cervical spine disease at [facility] with discectomy and insertion of disks.  He is followed by Dr. Burney Gauze for his hemochromatosis with goal to keep ferritin levels below 50 and iron saturation below 30%.  Lipid studies have revealed further increase in his LDL to 132.  He is not on therapy.  I again had a long discussion with him regarding subclinical atherosclerosis.  I have strongly recommended he undergo a cardiac calcium score for evaluation and if present he should initiate lipid-lowering therapy with target LDL less than 70 particularly with his strong family history for CAD.  Recently has had difficulty with sleep.  He is blaming this on hand and arm numbness which has developed since his cervical spine surgery.  He did have follow-up evaluation with his neurosurgeon who felt the surgical sites were stable.  He will be following up with Dr. Inda Merlin.  He may require nerve conduction velocities or other neurologic evaluation.  However I did bring up the possibility of sleep apnea as a contributor to his poor sleep with frequent awakenings.  At present we will defer a sleep study evaluation.  He is followed by Dr. Diona Fanti with reference to his prostate CA.  I will see him in 3 to 4 months for follow-up evaluation.  Troy Sine, MD, [facility] 06/08/2020 6:02 PM

## 2020-06-06 NOTE — Patient Instructions (Signed)
Medication Instructions:  No changes *If you need a refill on your cardiac medications before your next appointment, please call your pharmacy*   Lab Work: None If you have labs (blood work) drawn today and your tests are completely normal, you will receive your results only by: Marland Kitchen MyChart Message (if you have MyChart) OR . A paper copy in the mail If you have any lab test that is abnormal or we need to change your treatment, we will call you to review the results.   Testing/Procedures: Dr. Claiborne Billings has ordered a CT coronary calcium score. This test is done at [address] on the 3rd Floor. There is an $150 out-of-pocket charge for this test.  Coronary Calcium Scan A coronary calcium scan is an imaging test used to look for deposits of calcium and other fatty materials (plaques) in the inner lining of the blood vessels of the heart (coronary arteries). These deposits of calcium and plaques can partly clog and narrow the coronary arteries without producing any symptoms or warning signs. This puts a person at risk for a heart attack. This test can detect these deposits before symptoms develop.  Tell a health care provider about: . Any allergies you have . All medicines you are taking, including vitamins, herbs, eye drops, creams, and over-the-counter medicines. . Any problems you or family members have had with anesthetic medicines. . Any blood disorders you have. . Any surgeries you have had. . Any medical conditions you have. . Whether you are pregnant or may be pregnant.  What are the risks? Generally, this is a safe procedure. However, problems may occur, including: . Harm to a pregnant woman and her unborn baby. This test involves the use of radiation. Radiation exposure can be dangerous to a pregnant woman and her unborn baby. If you are pregnant, you generally should not have this procedure done. . Slight increase in the risk of cancer. This is because of the radiation  involved in the test.  What happens before the procedure? No preparation is needed for this procedure.  What happens during the procedure? Marland Kitchen You will undress and remove any jewelry around your neck or chest. . You will put on a hospital gown. . Sticky electrodes will be placed on your chest. The electrodes will be connected to an electrocardiogram (ECG) machine to record a tracing of the electrical activity of your heart. . A CT scanner will take pictures of your heart. During this time, you will be asked to lie still and hold your breath for 2-3 seconds while a picture of your heart is being taken. The procedure may vary among health care providers and hospitals.  What happens after the procedure? You can get dressed. You can return to your normal activities. It is up to you to get the results of your test. Ask your health care provider, or the department that is doing the test, when your results will be ready.  Summary . A coronary calcium scan is an imaging test used to look for deposits of calcium and other fatty materials (plaques) in the inner lining of the blood vessels of the heart (coronary arteries). Randall Bradley, this is a safe procedure. Tell your health care provider if you are pregnant or may be pregnant. . No preparation is needed for this procedure. . A CT scanner will take pictures of your heart. . You can return to your normal activities after the scan is done. . This information is not intended to replace  advice given to you by your health care provider. Make sure you discuss any questions you have with your health care provider.     Follow-Up: At [facility], you and your health needs are our priority.  As part of our continuing mission to provide you with exceptional heart care, we have created designated Provider Care Teams.  These Care Teams include your primary Cardiologist (physician) and Advanced Practice Providers (APPs -  Physician Assistants and Nurse  Practitioners) who all work together to provide you with the care you need, when you need it.  We recommend signing up for the patient portal called "MyChart".  Sign up information is provided on this After Visit Summary.  MyChart is used to connect with patients for Virtual Visits (Telemedicine).  Patients are able to view lab/test results, encounter notes, upcoming appointments, etc.  Non-urgent messages can be sent to your provider as well.   To learn more about what you can do with MyChart, go to [email or web address].    Your next appointment:   3-4 month(s)  The format for your next appointment:   In Person  Provider:   Shelva Majestic, MD   Other Instructions None

## 2020-06-08 ENCOUNTER — Encounter: Payer: Self-pay | Admitting: Cardiovascular Disease

## 2020-06-15 ENCOUNTER — Ambulatory Visit (INDEPENDENT_AMBULATORY_CARE_PROVIDER_SITE_OTHER)
Admission: RE | Admit: 2020-06-15 | Discharge: 2020-06-15 | Disposition: A | Discharge status: Home / Self Care | Payer: Self-pay | Source: Ambulatory Visit | Attending: Cardiovascular Disease | Admitting: Cardiovascular Disease

## 2020-06-15 ENCOUNTER — Other Ambulatory Visit: Payer: Self-pay

## 2020-06-15 DIAGNOSIS — E782 Mixed hyperlipidemia: Secondary | ICD-10-CM

## 2020-06-28 ENCOUNTER — Other Ambulatory Visit: Payer: BLUE CROSS/BLUE SHIELD

## 2020-06-29 ENCOUNTER — Telehealth: Payer: Self-pay | Admitting: Cardiovascular Disease

## 2020-06-29 DIAGNOSIS — Z79899 Other long term (current) drug therapy: Secondary | ICD-10-CM

## 2020-06-29 MED ORDER — ROSUVASTATIN CALCIUM 20 MG PO TABS: 20.0000 mg | ORAL_TABLET | Freq: Every day | ORAL | 3 refills | Status: DC

## 2020-06-29 NOTE — Telephone Encounter (Signed)
See Result Note.

## 2020-06-29 NOTE — Telephone Encounter (Signed)
Randall Bradley is returning [employer].

## 2020-07-08 ENCOUNTER — Other Ambulatory Visit: Payer: Self-pay | Admitting: Cardiovascular Disease

## 2020-07-12 ENCOUNTER — Ambulatory Visit (INDEPENDENT_AMBULATORY_CARE_PROVIDER_SITE_OTHER): Payer: BLUE CROSS/BLUE SHIELD | Admitting: Neurology

## 2020-07-12 ENCOUNTER — Encounter: Payer: Self-pay | Admitting: Neurology

## 2020-07-12 DIAGNOSIS — G5603 Carpal tunnel syndrome, bilateral upper limbs: Secondary | ICD-10-CM | POA: Diagnosis not present

## 2020-07-12 HISTORY — DX: Carpal tunnel syndrome, bilateral upper limbs: G56.03

## 2020-07-12 NOTE — Progress Notes (Signed)
Please refer to EMG and nerve conduction procedure note.  

## 2020-07-12 NOTE — Procedures (Signed)
     HISTORY:  Randall Bradley is a 65 year old gentleman with a 2-year history of bilateral hand numbness.  He reports recent cervical spine surgery at 3 levels but he continues to have ongoing numbness of the hands.  He denies any significant neck pain at this point.  He is being evaluated for a possible neuropathy or a cervical radiculopathy.  NERVE CONDUCTION STUDIES:  Nerve conduction studies were performed on both upper extremities.  The distal motor latencies for the median nerves were prolonged bilaterally with a low motor amplitude on the right and normal motor amplitude on the left.  The distal motor latencies and motor amplitudes for the ulnar nerves were normal bilaterally.  Nerve conduction velocities for the right median nerve was slowed, but was normal for the left median nerve and the ulnar nerves bilaterally.  The sensory latencies for the radial nerves were normal bilaterally and were prolonged for the left median nerve and right ulnar nerve, absent for the right median nerve.  The left ulnar sensory latency was normal.  The F-wave latencies for the ulnar nerves were prolonged bilaterally.  EMG STUDIES:  EMG study was performed on the right upper extremity:  The first dorsal interosseous muscle reveals 2 to 4 K units with full recruitment. No fibrillations or positive waves were noted. The abductor pollicis brevis muscle reveals 2 to 4 K units with slightly reduced recruitment. No fibrillations or positive waves were noted. The extensor indicis proprius muscle reveals 1 to 3 K units with full recruitment. No fibrillations or positive waves were noted. The pronator teres muscle reveals 2 to 3 K units with full recruitment. No fibrillations or positive waves were noted. The biceps muscle reveals 1 to 2 K units with full recruitment. No fibrillations or positive waves were noted. The triceps muscle reveals 2 to 4 K units with full recruitment. No fibrillations or positive waves were  noted. The anterior deltoid muscle reveals 2 to 3 K units with full recruitment. No fibrillations or positive waves were noted. The cervical paraspinal muscles were tested at 2 levels. No abnormalities of insertional activity were seen at either level tested. There was good relaxation.   IMPRESSION:  Nerve conduction studies done on both upper extremities shows evidence of bilateral carpal tunnel syndrome of moderate severity on the right and mild severity in the left.  There may also be a very mild primarily distal right ulnar neuropathy.  EMG evaluation of the right upper extremity does not show evidence of an overlying cervical radiculopathy.   Jill Alexanders MD 07/12/2020 2:50 PM  [facility] [address] [place], [place] [address]  Phone [phone] Fax [phone]

## 2020-07-13 NOTE — Progress Notes (Signed)
[place]    Nerve / Sites Muscle Latency Ref. Amplitude Ref. Rel Amp Segments Distance Velocity Ref. Area    ms ms mV mV %  cm m/s m/s mVms  R Median - APB     Wrist APB 5.1 ?4.4 1.0 ?4.0 100 Wrist - APB 7   3.6     Upper arm APB 11.4  3.4  350 Upper arm - Wrist 24 38 ?49 25.2     Ulnar Wrist APB 4.5  4.2  124 Ulnar Wrist - APB    19.7     Ulnar B. Elbow APB 9.2  4.8  116 Ulnar B. Elbow - APB    22.1     Ulnar A. Elbow APB 11.0  4.6  95.2 Ulnar A. Elbow - Ulnar B. Elbow    19.2  L Median - APB     Wrist APB 5.6 ?4.4 8.0 ?4.0 100 Wrist - APB 7   36.2     Upper arm APB 10.3  7.5  93.3 Upper arm - Wrist 23 49 ?49 33.7  R Ulnar - ADM     Wrist ADM 3.3 ?3.3 9.7 ?6.0 100 Wrist - ADM 7   47.8     B.Elbow ADM 8.0  8.3  86 B.Elbow - Wrist 23 49 ?49 39.2     A.Elbow ADM 10.0  8.3  99.5 A.Elbow - B.Elbow 10 49 ?49 38.9  L Ulnar - ADM     Wrist ADM 3.3 ?3.3 8.2 ?6.0 100 Wrist - ADM 7   45.6     B.Elbow ADM 7.8  8.0  97.5 B.Elbow - Wrist 22 49 ?49 38.8     A.Elbow ADM 9.8  6.9  86.8 A.Elbow - B.Elbow 10 49 ?49 34.9             SNC    Nerve / Sites Rec. Site Peak Lat Ref.  Amp Ref. Segments Distance    ms ms V V  cm  R Radial - Anatomical snuff box (Forearm)     Forearm Wrist 2.6 ?2.9 21 ?15 Forearm - Wrist 10  L Radial - Anatomical snuff box (Forearm)     Forearm Wrist 2.8 ?2.9 23 ?15 Forearm - Wrist 10  L Median - Orthodromic (Dig II, Mid palm)     Dig II Wrist 4.4 ?3.4 12 ?10 Dig II - Wrist 13  R Median - Orthodromic (Dig II, Mid palm)     Dig II Wrist NR ?3.4 NR ?10 Dig II - Wrist 13  R Ulnar - Orthodromic, (Dig V, Mid palm)     Dig V Wrist 3.3 ?3.1 11 ?5 Dig V - Wrist 11  L Ulnar - Orthodromic, (Dig V, Mid palm)     Dig V Wrist 3.1 ?3.1 13 ?5 Dig V - Wrist 41                 F  Wave    Nerve F Lat Ref.   ms ms  R Ulnar - ADM 32.7 ?32.0  L Ulnar - ADM 32.9 ?32.0

## 2020-09-14 ENCOUNTER — Inpatient Hospital Stay: Payer: BLUE CROSS/BLUE SHIELD | Attending: Hematology & Oncology

## 2020-09-14 ENCOUNTER — Other Ambulatory Visit: Payer: Self-pay

## 2020-09-14 ENCOUNTER — Encounter: Payer: Self-pay | Admitting: Hematology & Oncology

## 2020-09-14 ENCOUNTER — Inpatient Hospital Stay (HOSPITAL_BASED_OUTPATIENT_CLINIC_OR_DEPARTMENT_OTHER): Payer: BLUE CROSS/BLUE SHIELD | Admitting: Hematology & Oncology

## 2020-09-14 ENCOUNTER — Inpatient Hospital Stay: Payer: BLUE CROSS/BLUE SHIELD

## 2020-09-14 DIAGNOSIS — R059 Cough, unspecified: Secondary | ICD-10-CM | POA: Diagnosis not present

## 2020-09-14 DIAGNOSIS — Z79899 Other long term (current) drug therapy: Secondary | ICD-10-CM | POA: Insufficient documentation

## 2020-09-14 LAB — CMP (CANCER CENTER ONLY)
ALT: 16 U/L (ref 0–44)
AST: 18 U/L (ref 15–41)
Albumin: 4.4 g/dL (ref 3.5–5.0)
Alkaline Phosphatase: 77 U/L (ref 38–126)
Anion gap: 6 (ref 5–15)
BUN: 17 mg/dL (ref 8–23)
CO2: 31 mmol/L (ref 22–32)
Calcium: 9.7 mg/dL (ref 8.9–10.3)
Chloride: 102 mmol/L (ref 98–111)
Creatinine: 1.04 mg/dL (ref 0.61–1.24)
GFR, Estimated: >60 mL/min (ref >60)
Glucose, Bld: 112 mg/dL — ABNORMAL HIGH (ref 70–99)
Potassium: 4 mmol/L (ref 3.5–5.1)
Sodium: 139 mmol/L (ref 135–145)
Total Bilirubin: 0.5 mg/dL (ref 0.3–1.2)
Total Protein: 7 g/dL (ref 6.5–8.1)

## 2020-09-14 LAB — CBC WITH DIFFERENTIAL (CANCER CENTER ONLY)
Abs Immature Granulocytes: 0.01 10*3/uL (ref 0.00–0.07)
Basophils Absolute: 0 10*3/uL (ref 0.0–0.1)
Basophils Relative: 1 %
Eosinophils Absolute: 0.1 10*3/uL (ref 0.0–0.5)
Eosinophils Relative: 2 %
HCT: 42.2 % (ref 39.0–52.0)
Hemoglobin: 13.8 g/dL (ref 13.0–17.0)
Immature Granulocytes: 0 %
Lymphocytes Relative: 38 %
Lymphs Abs: 2.1 10*3/uL (ref 0.7–4.0)
MCH: 26.8 pg (ref 26.0–34.0)
MCHC: 32.7 g/dL (ref 30.0–36.0)
MCV: 81.9 fL (ref 80.0–100.0)
Monocytes Absolute: 0.6 10*3/uL (ref 0.1–1.0)
Monocytes Relative: 10 %
Neutro Abs: 2.8 10*3/uL (ref 1.7–7.7)
Neutrophils Relative %: 49 %
Platelet Count: 216 10*3/uL (ref 150–400)
RBC: 5.15 MIL/uL (ref 4.22–5.81)
RDW: 13.9 % (ref 11.5–15.5)
WBC Count: 5.7 10*3/uL (ref 4.0–10.5)
nRBC: 0 % (ref 0.0–0.2)

## 2020-09-14 LAB — IRON AND TIBC
Iron: 103 ug/dL (ref 45–182)
Saturation Ratios: 31 % (ref 17.9–39.5)
TIBC: 335 ug/dL (ref 250–450)
UIBC: 232 ug/dL

## 2020-09-14 LAB — FERRITIN: Ferritin: 16 ng/mL — ABNORMAL LOW (ref 24–336)

## 2020-09-14 NOTE — Progress Notes (Signed)
Hematology and Oncology Follow Up Visit  Randall Bradley [id number] 10/03/1955 65 y.o. 09/14/2020   Principle Diagnosis:   Hereditary hemochromatosis (C282Y Homozygous)  Current Therapy:    Phlebotomy/blood donation to maintain ferritin less than 50 and iron saturation less than 30%     Interim History:  Randall Bradley is back for follow-up.  He seems to doing okay.  [facility].  As such, if he ever needs to be phlebotomized for the hemochromatosis, we can do that here.  I would not think that he needs to be phlebotomized.  The last time that he has labs checked 6 months ago his ferritin was 18 with an iron saturation of 20%.  His biggest problem is his dry cough.  He has occasional purulent sputum.  He said he had chest x-ray in December.  I guess this probably did not show anything.  He has had no fever.  He has had no hemoptysis.  Unfortunately, his older brother recent was found to have lung cancer. We will go ahead and get a chest CT scan on him.  I think this would be reasonable.  He has had no problems with nausea or vomiting.  He walks about 5-7 miles a day.  There really is no obvious increase shortness of breath..  He has had no change in bowel or bladder habits.  [name] has had neck surgery.  He had 3 discs taken out.  He is also had carpal tunnel surgery on the right hand.  He says both of these really has made his life better.  Currently, his performance status is ECOG 1.    Medications:  Current Outpatient Medications:  .  ampicillin (PRINCIPEN) 500 MG capsule, Take 500 mg by mouth 2 (two) times daily., Disp: , Rfl:  .  aspirin EC 81 MG tablet, Take 81 mg by mouth daily., Disp: , Rfl:  .  BREO ELLIPTA 100-25 MCG/INH AEPB, Inhale 1 puff into the lungs daily., Disp: , Rfl:  .  cephALEXin (KEFLEX) 500 MG capsule, Take 500 mg by mouth 4 (four) times daily., Disp: , Rfl:  .  cyproheptadine (PERIACTIN) 4 MG tablet, Take 4 mg by mouth 2  (two) times daily as needed for allergies., Disp: , Rfl:  .  doxycycline (VIBRAMYCIN) 100 MG capsule, Take 100 mg by mouth 2 (two) times daily., Disp: , Rfl:  .  fexofenadine (ALLEGRA ALLERGY) 180 MG tablet, 1 tablet, Disp: , Rfl:  .  finasteride (PROSCAR) 5 MG tablet, Take 1 tablet by mouth daily., Disp: , Rfl:  .  gabapentin (NEURONTIN) 300 MG capsule, Take 300 mg by mouth in the morning, at noon, and at bedtime., Disp: , Rfl:  .  hydrochlorothiazide (HYDRODIURIL) 12.5 MG tablet, Take 1 tablet by mouth every morning., Disp: , Rfl:  .  HYDROcodone-acetaminophen (NORCO/VICODIN) 5-325 MG tablet, Take by mouth., Disp: , Rfl:  .  losartan (COZAAR) 25 MG tablet, Take 25 mg by mouth daily., Disp: , Rfl:  .  Multiple Vitamin (MULTIVITAMIN WITH MINERALS) TABS tablet, Take 1 tablet by mouth daily. Reported on 06/28/2015, Disp: , Rfl:  .  rosuvastatin (CRESTOR) 20 MG tablet, Take 1 tablet (20 mg total) by mouth daily., Disp: 90 tablet, Rfl: 3 .  traMADol (ULTRAM) 50 MG tablet, tramadol 50 mg tablet  TAKE 1 TABLET (50 MG TOTAL) BY MOUTH EVERY 6 (SIX) HOURS AS NEEDED., Disp: , Rfl:  .  VITAMIN D PO, Take by mouth., Disp: ,  Rfl:   Allergies:  Allergies  Allergen Reactions  . Ropinirole Hcl Other (See Comments)    diffuse pruritus diffuse pruritus - 20 years  . Other Other (See Comments)    Past Medical History, Surgical history, Social history, and Family History were reviewed and updated.  Review of Systems: Review of Systems  Constitutional: Negative.   HENT:  Negative.   Eyes: Negative.   Respiratory: Negative.   Cardiovascular: Negative.   Gastrointestinal: Negative.   Endocrine: Negative.   Genitourinary: Negative.    Musculoskeletal: Negative.   Skin: Negative.   Neurological: Negative.   Hematological: Negative.   Psychiatric/Behavioral: Negative.     Physical Exam:  weight is 203 lb (92.1 kg). His oral temperature is 98.1 F (36.7 C). His blood pressure is 139/98 (abnormal) and  his pulse is 65. His respiration is 16 and oxygen saturation is 100%.   Wt Readings from Last 3 Encounters:  09/14/20 203 lb (92.1 kg)  06/06/20 207 lb 3.2 oz (94 kg)  03/16/20 204 lb 4 oz (92.6 kg)    Physical Exam Vitals reviewed.  HENT:     Head: Normocephalic and atraumatic.  Eyes:     Pupils: Pupils are equal, round, and reactive to light.  Cardiovascular:     Rate and Rhythm: Normal rate and regular rhythm.     Heart sounds: Normal heart sounds.  Pulmonary:     Effort: Pulmonary effort is normal.     Breath sounds: Normal breath sounds.  Abdominal:     General: Bowel sounds are normal.     Palpations: Abdomen is soft.  Musculoskeletal:        General: No tenderness or deformity. Normal range of motion.     Cervical back: Normal range of motion.  Lymphadenopathy:     Cervical: No cervical adenopathy.  Skin:    General: Skin is warm and dry.     Findings: No erythema or rash.  Neurological:     Mental Status: He is alert and oriented to person, place, and time.  Psychiatric:        Behavior: Behavior normal.        Thought Content: Thought content normal.        Judgment: Judgment normal.      Lab Results  Component Value Date   WBC 5.7 09/14/2020   HGB 13.8 09/14/2020   HCT 42.2 09/14/2020   MCV 81.9 09/14/2020   PLT 216 09/14/2020     Chemistry      Component Value Date/Time   NA 139 09/14/2020 1153   K 4.0 09/14/2020 1153   CL 102 09/14/2020 1153   CO2 31 09/14/2020 1153   BUN 17 09/14/2020 1153   CREATININE 1.04 09/14/2020 1153   CREATININE 0.84 10/08/2016 1423      Component Value Date/Time   CALCIUM 9.7 09/14/2020 1153   ALKPHOS 77 09/14/2020 1153   AST 18 09/14/2020 1153   ALT 16 09/14/2020 1153   BILITOT 0.5 09/14/2020 1153      Impression and Plan: Randall Bradley is a very nice 65 year old white male.  He has hereditary hemochromatosis.  Again I would suspect that his iron studies should be okay.  His MCV is only 82.  We will have to  see what the CT scan shows.  Again I would suspect that this will be negative for any problems.  If so, his family doctor will be the one who asked to manage the cough.  I will plan to  get him back in 6 more months.  I really think that 5-month follow-ups will work nicely.     [name], MD 3/23/20221:16 PM

## 2020-09-15 ENCOUNTER — Telehealth: Payer: Self-pay

## 2020-09-15 NOTE — Telephone Encounter (Signed)
Pt called and his phlebot was scheduled

## 2020-09-19 ENCOUNTER — Other Ambulatory Visit: Payer: Self-pay

## 2020-09-19 ENCOUNTER — Inpatient Hospital Stay: Payer: BLUE CROSS/BLUE SHIELD

## 2020-09-19 NOTE — Progress Notes (Signed)
Randall Bradley presents today for phlebotomy per MD orders. Phlebotomy procedure started at 1321 and ended at 1330 500 grams removed. Patient observed for 30 minutes after procedure without any incident. Patient tolerated procedure well. IV needle removed intact.

## 2020-09-19 NOTE — Patient Instructions (Signed)
Therapeutic Phlebotomy Therapeutic phlebotomy is the planned removal of blood from a person's body for the purpose of treating a medical condition. The procedure is similar to donating blood. Usually, about a pint (470 mL, or 0.47 L) of blood is removed. The average adult has 9-12 pints (4.3-5.7 L) of blood in the body. Therapeutic phlebotomy may be used to treat the following medical conditions:  Hemochromatosis. This is a condition in which the blood contains too much iron.  Polycythemia vera. This is a condition in which the blood contains too many red blood cells.  Porphyria cutanea tarda. This is a disease in which an important part of hemoglobin is not made properly. It results in the buildup of abnormal amounts of porphyrins in the body.  Sickle cell disease. This is a condition in which the red blood cells form an abnormal crescent shape rather than a round shape. Tell a health care provider about:  Any allergies you have.  All medicines you are taking, including vitamins, herbs, eye drops, creams, and over-the-counter medicines.  Any problems you or family members have had with anesthetic medicines.  Any blood disorders you have.  Any surgeries you have had.  Any medical conditions you have.  Whether you are pregnant or may be pregnant. What are the risks? Generally, this is a safe procedure. However, problems may occur, including:  Nausea or light-headedness.  Low blood pressure (hypotension).  Soreness, bleeding, swelling, or bruising at the needle insertion site.  Infection. What happens before the procedure?  Follow instructions from your health care provider about eating or drinking restrictions.  Ask your health care provider about: ? Changing or stopping your regular medicines. This is especially important if you are taking diabetes medicines or blood thinners (anticoagulants). ? Taking medicines such as aspirin and ibuprofen. These medicines can thin your  blood. Do not take these medicines unless your health care provider tells you to take them. ? Taking over-the-counter medicines, vitamins, herbs, and supplements.  Wear clothing with sleeves that can be raised above the elbow.  Plan to have someone take you home from the hospital or clinic.  You may have a blood sample taken.  Your blood pressure, pulse rate, and breathing rate will be measured. What happens during the procedure?  To lower your risk of infection: ? Your health care team will wash or sanitize their hands. ? Your skin will be cleaned with an antiseptic.  You may be given a medicine to numb the area (local anesthetic).  A tourniquet will be placed on your arm.  A needle will be inserted into one of your veins.  Tubing and a collection bag will be attached to that needle.  Blood will flow through the needle and tubing into the collection bag.  The collection bag will be placed lower than your arm to allow gravity to help the flow of blood into the bag.  You may be asked to open and close your hand slowly and continually during the entire collection.  After the specified amount of blood has been removed from your body, the collection bag and tubing will be clamped.  The needle will be removed from your vein.  Pressure will be held on the site of the needle insertion to stop the bleeding.  A bandage (dressing) will be placed over the needle insertion site. The procedure may vary among health care providers and hospitals.   What happens after the procedure?  Your blood pressure, pulse rate, and breathing rate will   be measured after the procedure.  You will be encouraged to drink fluids.  Your recovery will be assessed and monitored.  You can return to your normal activities as told by your health care provider. Summary  Therapeutic phlebotomy is the planned removal of blood from a person's body for the purpose of treating a medical condition.  Therapeutic  phlebotomy may be used to treat hemochromatosis, polycythemia vera, porphyria cutanea tarda, or sickle cell disease.  In the procedure, a needle is inserted and about a pint (470 mL, or 0.47 L) of blood is removed. The average adult has 9-12 pints (4.3-5.7 L) of blood in the body.  This is generally a safe procedure, but it can sometimes cause problems such as nausea, light-headedness, or low blood pressure (hypotension). This information is not intended to replace advice given to you by your health care provider. Make sure you discuss any questions you have with your health care provider. Document Revised: 06/27/2017 Document Reviewed: 06/27/2017 Elsevier Patient Education  2021 [employer]  

## 2020-09-21 ENCOUNTER — Encounter (HOSPITAL_BASED_OUTPATIENT_CLINIC_OR_DEPARTMENT_OTHER): Payer: Self-pay

## 2020-09-21 ENCOUNTER — Ambulatory Visit (HOSPITAL_BASED_OUTPATIENT_CLINIC_OR_DEPARTMENT_OTHER)
Admission: RE | Admit: 2020-09-21 | Discharge: 2020-09-21 | Disposition: A | Discharge status: Home / Self Care | Payer: BLUE CROSS/BLUE SHIELD | Source: Ambulatory Visit | Attending: Hematology & Oncology | Admitting: Hematology & Oncology

## 2020-09-21 ENCOUNTER — Other Ambulatory Visit: Payer: Self-pay

## 2020-09-21 DIAGNOSIS — R059 Cough, unspecified: Secondary | ICD-10-CM | POA: Insufficient documentation

## 2020-09-21 MED ORDER — IOHEXOL 300 MG/ML  SOLN
100.0000 mL | Freq: Once | INTRAMUSCULAR | Status: AC | PRN
  Administered 2020-09-21: 75 mL via INTRAVENOUS

## 2020-09-22 ENCOUNTER — Telehealth: Payer: Self-pay | Admitting: *Deleted

## 2020-09-22 NOTE — Telephone Encounter (Addendum)
-----   Message from [name], MD sent at 09/22/2020 11:53 AM EDT ----- Called patient to let him know that the CT of the chest does not show any obvious pneumonia.  Looks like some inflammatory changes.  If cough persists we can try some prednisone.  Laurey Arrow

## 2020-10-10 ENCOUNTER — Encounter: Payer: Self-pay | Admitting: Cardiovascular Disease

## 2020-10-10 ENCOUNTER — Ambulatory Visit (INDEPENDENT_AMBULATORY_CARE_PROVIDER_SITE_OTHER): Payer: BLUE CROSS/BLUE SHIELD | Admitting: Cardiovascular Disease

## 2020-10-10 ENCOUNTER — Other Ambulatory Visit: Payer: Self-pay

## 2020-10-10 VITALS — BP 114/82 | HR 61 | Ht 74.0 in | Wt 201.8 lb

## 2020-10-10 DIAGNOSIS — Z8546 Personal history of malignant neoplasm of prostate: Secondary | ICD-10-CM

## 2020-10-10 DIAGNOSIS — I700 Atherosclerosis of aorta: Secondary | ICD-10-CM

## 2020-10-10 DIAGNOSIS — Z8249 Family history of ischemic heart disease and other diseases of the circulatory system: Secondary | ICD-10-CM

## 2020-10-10 DIAGNOSIS — R931 Abnormal findings on diagnostic imaging of heart and coronary circulation: Secondary | ICD-10-CM | POA: Diagnosis not present

## 2020-10-10 DIAGNOSIS — I2510 Atherosclerotic heart disease of native coronary artery without angina pectoris: Secondary | ICD-10-CM

## 2020-10-10 DIAGNOSIS — I2584 Coronary atherosclerosis due to calcified coronary lesion: Secondary | ICD-10-CM

## 2020-10-10 DIAGNOSIS — E782 Mixed hyperlipidemia: Secondary | ICD-10-CM

## 2020-10-10 NOTE — Patient Instructions (Signed)
Medication Instructions:  Your physician recommends that you continue on your current medications as directed. Please refer to the Current Medication list given to you today.  *If you need a refill on your cardiac medications before your next appointment, please call your pharmacy*   Lab Work: Please return for FASTING labs tomorrow (CMET,Lipid)  Our in office lab hours are Monday-Friday 8:00-4:00, closed for lunch 12:45-1:45 pm.  No appointment needed.  Follow-Up: At [facility], you and your health needs are our priority.  As part of our continuing mission to provide you with exceptional heart care, we have created designated Provider Care Teams.  These Care Teams include your primary Cardiologist (physician) and Advanced Practice Providers (APPs -  Physician Assistants and Nurse Practitioners) who all work together to provide you with the care you need, when you need it.  We recommend signing up for the patient portal called "MyChart".  Sign up information is provided on this After Visit Summary.  MyChart is used to connect with patients for Virtual Visits (Telemedicine).  Patients are able to view lab/test results, encounter notes, upcoming appointments, etc.  Non-urgent messages can be sent to your provider as well.   To learn more about what you can do with MyChart, go to [email or web address].    Your next appointment:   6 month(s)  The format for your next appointment:   In Person  Provider:   Shelva Majestic, MD

## 2020-10-10 NOTE — Progress Notes (Signed)
Patient ID: Randall Bradley, male   DOB: Dec 06, 1955, 65 y.o.   MRN: [id number]     Primary MD: Dr. Inda Merlin  PATIENT PROFILE: Randall Bradley is a 65 y.o. male who was initially self-referred due to a significant family history for CAD, and recently being told of having diastolic dysfunction.  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  He presents for a 4 month follow-up evaluation.  HPI:  EDUIN FRIEDEL denies any known history of prior heart disease.  He has a history of hypertension for a ~4 years for which most recently he has been on losartan at 50 mg daily.  He also has a history of rosacea for which he takes minocycline 100 mg every evening.  He takes finasteride for hair loss.  In November 2016, he underwent initial left knee replacement surgery which he tolerated well.  However, he was told of having an increased heart rate in the postoperative period.  When I review the hospital records,  heart rates were in the 90s without any incidences of documented arrhythmia or significant ectopy.  He subsequently saw his primary physician, Dr. Mertha Finders, who referred him for an echo Doppler study which was done on 05/24/2015.  This revealed an ejection fraction of 55-60%.  There was evidence for mild left ventricular hypertrophy with normal systolic function.  Diastolic parameters suggested mild grade 1 diastolic dysfunction.  There was evidence for trivial mitral regurgitation, trivial tricuspid regurgitation and trivial pulmonic regurgitation.  PA pressure was normal.  On 06/08/2015 he underwent right total knee replacement and tolerated this well with no mention of cardiac arrhythmia in the postoperative period. He has a strong family history for heart disease with a father having suffered multiple myocardial infarctions and died at age 1.  He has a past medical history significant for radical prostatectomy in 2007 prostate cancer. He is followed by Dr. Buford Dresser for urologic care.  He also has a history  of hemochromatosis.   When I saw him in February 2018 he was remaining stable  from a cardiac standpoint.  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 He has several older cows for breeding.   I saw him in March 2019 and over the previous year he had remained very active.  During that evaluation he denied anyhest pain, PND, orthopnea.  He denied palpitations.  Since his knee replacements he has no restrictions in his movement.  He denied any difficulty with sleep. He had the flu in December.  He works out at least 3 days per week.   He was seen by me in June 2021.  Since his previous evaluation with me he had gone to give blood donation in February 2020 and at that time he was told that he had tested positive with Covid antibodies.  As result in retrospect he seems he may have had Covid instead of the "flu" in December 2019 prior to its awareness in the [place].  Over the past several years, he ultimately had an offer and sold his house and farm where he raised calves just outside [place].  He has now moved back to [place].  He recently was evaluated by Dr. Burney Gauze for hemochromatosis and has undergone genetic testing which is still pending.  He denied chest pain  or shortness of breath.  He remained active doing yard work, walking, and biking.  During my evaluation, I reviewed lipid studies which were done in 2020 which showed an LDL of 96 and elevated triglycerides at 214.  He was asymptomatic.  With his very strong family history I recommended he undergo a cardiac calcium score evaluation for risk stratification.  He was following up with Dr. Diona Fanti with reference to his prostate CA.  I last saw him on June 06, 2020 and over the prior 6 months he continued to do well.  Has yet to have his calcium score. He continues  to be active living in [place] and walks 3 to 5 miles per day without chest pain or shortness of breath.  He has been seeing Dr. Inda Merlin for hand numbness.  Often he does not sleep well because of bilateral hand and arm discomfort.  He had undergone C3-4-5 disc surgery by Dr. Grayland Ormond at [facility].  Lipid studies in July 2021 showed an LDL cholesterol at 132.  He is not on treatment.  He is on losartan 25 mg and hydrochlorothiazide 12.5 mg for hypertension.    Since I last saw him, he underwent CT cardiac scoring which showed a calcium score of 240, representing 68th percentile for age and sex matched control.  No acute findings were noted on chest CT over read.  He continues to walk 5 to 7 days/week without chest pain or exertional shortness of breath.  He is followed by Dr. Marin Olp for his hemochromatosis and recently had a phlebotomy with his increased iron saturation at 31.  He continues to be on rosuvastatin 20 mg daily for, he has significantly changed his diet since his initial evaluation and prior to rosuvastatin initiation when his LDL cholesterol was 132.  He is on losartan 50 mg daily and HCTZ for hypertension.  He is on [employer] and denies recent wheezing.  He presents for evaluation   Past Medical History:  Diagnosis Date  . Alopecia   . Arthritis    "knees"  . Bilateral carpal tunnel syndrome 07/12/2020  . Hematuria   . Hemochromatosis   . Hemochromatosis, hereditary ([place]) 03/16/2020  . Hereditary hemochromatosis ([place]) monitored by PCP  dr [name] gates   history of phlebotomies -- last one 2014 (pt states body has normalized)  . History of bladder stone   . Hypertension   . Kidney stones   . Prostate cancer ([place])    UROLOGIST-- DR Diona Fanti; S/P PROSTATECTOMY 03-17-2008  . Right ureteral stone   . Rosacea     Past Surgical History:  Procedure Laterality Date  . APPENDECTOMY  1963  . COLONOSCOPY    . COMPLEX WOUND CLOSURE Left 03-26-2009   INDEX FINGER  COMPLEX WOUND LACERATION REPAIR  . CYSTOLITHOLAPAXY AND URETHRAL DILATION  10-05-2009  . CYSTOSCOPY W/ RETROGRADES Right 10/11/2014   Procedure: CYSTOSCOPY WITH RETROGRADE PYELOGRAM;  Surgeon: Franchot Gallo, MD;  [facility];  Service: Urology;  Laterality: Right;  . HOLMIUM LASER APPLICATION N/A 7/78/2423   Procedure: HOLMIUM LASER LITHOTRIPSY, ;  Surgeon: Franchot Gallo, MD;  Location[facility];  Service: Urology;  Laterality: N/A;  . JOINT REPLACEMENT    . KNEE ARTHROSCOPY Left 1995  . LIPOMA EXCISION  03-04-2000   forehead  . ROBOT ASSISTED LAPAROSCOPIC RADICAL PROSTATECTOMY  03-17-2008   w/  BILATERAL PELVIC LYMPHADENECTOMY (NON-NERVE SPARING)  . TOTAL KNEE ARTHROPLASTY Left 04/27/2015   Procedure: TOTAL KNEE ARTHROPLASTY;  Surgeon: [name], MD;  Location: [facility];  Service: Orthopedics;  Laterality: Left;  . TOTAL KNEE ARTHROPLASTY Right 06/08/2015  . TOTAL KNEE ARTHROPLASTY Right 06/08/2015   Procedure: TOTAL RIGHT KNEE ARTHROPLASTY;  Surgeon: [name], MD;  Location: [place];  Service: Orthopedics;  Laterality: Right;    Allergies  Allergen Reactions  . Ropinirole Hcl Other (See Comments)    diffuse pruritus diffuse pruritus - 20 years  . Other Other (See Comments)    Current Outpatient Medications  Medication Sig Dispense Refill  . ampicillin (PRINCIPEN) 500 MG capsule Take 500 mg by mouth 2 (two) times daily.    Marland Kitchen aspirin EC 81 MG tablet Take 81 mg by mouth daily.    Marland Kitchen BREO ELLIPTA 100-25 MCG/INH AEPB Inhale 1 puff into the lungs daily.    . cyproheptadine (PERIACTIN) 4 MG tablet Take 4 mg by mouth 2 (two) times daily as needed for allergies.    . fexofenadine (ALLEGRA) 180 MG tablet 1 tablet    . finasteride (PROSCAR) 5 MG tablet Take 1 tablet by mouth daily.    Marland Kitchen gabapentin (NEURONTIN) 300 MG capsule Take 300 mg by mouth in the morning, at noon, and at bedtime.    . hydrochlorothiazide (HYDRODIURIL) 12.5 MG  tablet Take 1 tablet by mouth every morning.    Marland Kitchen HYDROcodone-acetaminophen (NORCO/VICODIN) 5-325 MG tablet Take by mouth.    . losartan (COZAAR) 50 MG tablet Take 50 mg by mouth daily.    . Multiple Vitamin (MULTIVITAMIN WITH MINERALS) TABS tablet Take 1 tablet by mouth daily. Reported on 06/28/2015    . rosuvastatin (CRESTOR) 20 MG tablet Take 1 tablet (20 mg total) by mouth daily. 90 tablet 3  . VITAMIN D PO Take by mouth.     No current facility-administered medications for this visit.    Social History   Socioeconomic History  . Marital status: Married    Spouse name: Not on file  . Number of children: Not on file  . Years of education: Not on file  . Highest education level: Not on file  Occupational History  . Not on file  Tobacco Use  . Smoking status: Former Smoker    Packs/day: 0.50    Years: 15.00    Pack years: 7.50    Types: Cigarettes    Quit date: 10/07/1993    Years since quitting: 27.0  . Smokeless tobacco: Never Used  Vaping Use  . Vaping Use: Never used  Substance and Sexual Activity  . Alcohol use: Yes    Comment: "I quit drinking in ~ 2004"  . Drug use: No  . Sexual activity: Never  Other Topics Concern  . Not on file  Social History Narrative  . Not on file   Social Determinants of Health   Financial Resource Strain: Not on file  Food Insecurity: Not on file  Transportation Needs: Not on file  Physical Activity: Not on file  Stress: Not on file  Social Connections: Not on file  Intimate Partner Violence: Not on file   Additional social history is notable that he is single.  Ther remote tobacco history.  He previously had worked for [employer], [employer] in [place], and spent 2 years in [place] in [employer].  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    Family history is notable that his father died at 27, but it suffered multiple myocardial infarctions.  His mother  died at  41 and had undergone some 2 CABG operations.  He has 3 brothers with hypertension.  One brother also has daily prostate and thyroid cancer.  ROS General: Negative; No fevers, chills, or night sweats HEENT: Negative; No changes in vision or hearing, sinus congestion, difficulty swallowing Pulmonary: Negative; No cough, wheezing, shortness of breath, hemoptysis Cardiovascular:  See HPI; No chest pain, presyncope, syncope, palpitations, edema GI: Negative; No nausea, vomiting, diarrhea, or abdominal pain GU: Negative; No dysuria, hematuria, or difficulty voiding Musculoskeletal: Negative; no myalgias, joint pain, or weakness Hematologic/Oncologic: Negative; no easy bruising, bleeding Endocrine: Negative; no heat/cold intolerance; no diabetes Neuro: Negative; no changes in balance, headaches Skin: Negative; No rashes or skin lesions Psychiatric: Negative; No behavioral problems, depression Sleep: Negative; No daytime sleepiness, hypersomnolence, bruxism, restless legs, hypnogagnic hallucinations Other comprehensive 14 point system review is negative   Physical Exam BP 114/82 (BP Location: Left Arm, Patient Position: Sitting, Cuff Size: Normal)   Pulse 61   Ht _0  (1.88 m)   Wt 201 lb 12.8 oz (91.5 kg)   SpO2 99%   BMI 25.91 kg/m   Repeat blood pressure by me was 118/80  Wt Readings from Last 3 Encounters:  10/10/20 201 lb 12.8 oz (91.5 kg)  09/14/20 203 lb (92.1 kg)  06/06/20 207 lb 3.2 oz (94 kg)   General: Alert, oriented, no distress.  Skin: normal turgor, no rashes, warm and dry HEENT: Normocephalic, atraumatic. Pupils equal round and reactive to light; sclera anicteric; extraocular muscles intact;  Nose without nasal septal hypertrophy Mouth/Parynx benign; Mallinpatti scale 3 Neck: No JVD, no carotid bruits; normal carotid upstroke Lungs: clear to ausculatation and percussion; no wheezing or rales Chest wall: without tenderness to palpitation Heart: PMI not displaced,  RRR, s1 s2 normal, 1/6 systolic murmur, no diastolic murmur, no rubs, gallops, thrills, or heaves Abdomen: soft, nontender; no hepatosplenomehaly, BS+; abdominal aorta nontender and not dilated by palpation. Back: no CVA tenderness Pulses 2+ Musculoskeletal: full range of motion, normal strength, no joint deformities Extremities: no clubbing cyanosis or edema, Homan's sign negative  Neurologic: grossly nonfocal; Cranial nerves grossly wnl Psychologic: Normal mood and affect  ECG (independently read by me):   NSR at 61, no ectopy, normal intervals  June 06, 2020 ECG (independently read by me): Normal sinus rhythm at 69 bpm.  No ectopy.  Early transition.  Normal intervals.  December 21, 1998 ECG (independently read by me): NSR at 67; no ectopy, normal intervals  March 2019 ECG (independently read by me): normal sinus rhythm at 69 bpm.  No ectopy.  Normal intervals.  No ST segment changes.  February 2018 ECG (independently read by me): Normal sinus rhythm at 75 bpm.  Normal intervals.  No ST segment changes.  No ectopy.  January 2017 ECG (independently read by me): Normal sinus rhythm 81 bpm.  Normal intervals.  Early transition.  LABS:  BMP Latest Ref Rng & Units 10/11/2020 09/14/2020 03/16/2020  Glucose 65 - 99 mg/dL 93 112(H) 100(H)  BUN 8 - 27 mg/dL 21 17 27(H)  Creatinine 0.76 - 1.27 mg/dL 1.01 1.04 1.11  BUN/Creat Ratio 10 - 24 21 - -  Sodium 134 - 144 mmol/L 141 139 140  Potassium 3.5 - 5.2 mmol/L 4.3 4.0 3.7  Chloride 96 - 106 mmol/L 102 102 102  CO2 20 - 29 mmol/L _1 Calcium 8.6 - 10.2 mg/dL 9.3 9.7 9.9     Hepatic Function Latest Ref Rng & Units 10/11/2020 09/14/2020  03/16/2020  Total Protein 6.0 - 8.5 g/dL 6.7 7.0 7.0  Albumin 3.8 - 4.8 g/dL 4.4 4.4 4.3  AST 0 - 40 IU/L _0 ALT 0 - 44 IU/L _1 Alk Phosphatase 44 - 121 IU/L 79 77 52  Total Bilirubin 0.0 - 1.2 mg/dL 0.3 0.5 0.3    CBC Latest Ref Rng & Units 09/14/2020 03/16/2020 08/28/2019  WBC 4.0 - 10.5  K/uL 5.7 4.7 5.1  Hemoglobin 13.0 - 17.0 g/dL 13.8 13.9 12.6(L)  Hematocrit 39.0 - 52.0 % 42.2 42.4 37.9(L)  Platelets 150 - 400 K/uL 216 229 220   Lab Results  Component Value Date   MCV 81.9 09/14/2020   MCV 86.2 03/16/2020   MCV 86.7 08/28/2019   Lab Results  Component Value Date   TSH 3.00 10/08/2016   No results found for: HGBA1C   BNP No results found for: BNP  ProBNP No results found for: PROBNP   Lipid Panel     Component Value Date/Time   CHOL 118 10/11/2020 0841   TRIG 52 10/11/2020 0841   HDL 47 10/11/2020 0841   CHOLHDL 2.5 10/11/2020 0841   CHOLHDL 3.3 10/08/2016 1423   VLDL 26 10/08/2016 1423   LDLCALC 59 10/11/2020 0841    RADIOLOGY: CT Chest W Contrast  Result Date: 09/22/2020 CLINICAL DATA:  Persistent cough EXAM: CT CHEST WITH CONTRAST TECHNIQUE: Multidetector CT imaging of the chest was performed during intravenous contrast administration. CONTRAST:  52m OMNIPAQUE IOHEXOL 300 MG/ML  SOLN COMPARISON:  01/03/2005 and cardiac CT from 06/15/2020 FINDINGS: Cardiovascular: Coronary, aortic arch, and branch vessel atherosclerotic vascular disease. Mediastinum/Nodes: Calcified lymph nodes indicating old granulomatous disease. No pathologic adenopathy identified. Lungs/Pleura: 3 mm right apical pulmonary nodule on image 27/4, not readily apparent on 01/03/2005. 0.6 by 0.4 cm ground-glass density right upper lobe pulmonary nodule on image 79 series 4, not readily apparent on 01/03/2005. Two nodules in the 3-4 mm range in the right middle lobe along the minor fissure on images 87 and 89, respectively, not readily apparent on 01/03/2005. Mild scarring medially in the right middle lobe. Upper Abdomen: Hypodense lesion in the right kidney upper pole measuring about 0.8 by 1.5 cm on image 167 of series 2 is technically nonspecific although statistically likely to be a cyst. Musculoskeletal: Lower cervical plate and screw fixator. Degenerative glenohumeral arthropathy  bilaterally. Thoracic spondylosis. IMPRESSION: 1. Nodules in the 5 mm diameter and under range in the right lung. No follow-up needed if patient is low-risk (and has no known or suspected primary neoplasm). Non-contrast chest CT can be considered in 12 months if patient is high-risk. This recommendation follows the consensus statement: Guidelines for Management of Incidental Pulmonary Nodules Detected on CT Images: From the Fleischner Society 2017; Radiology 2017; 284:228-243. 2.  Aortic Atherosclerosis (ICD10-I70.0).  Coronary atherosclerosis. 3. Old granulomatous disease. Electronically Signed   By: WVan ClinesM.D.   On: 09/22/2020 09:21     IMPRESSION:  1. Coronary artery calcification   2. Agatston coronary artery calcium score between 200 and 399   3. Aortic atherosclerosis ([place]   4. Mixed hyperlipidemia   5. Family history of heart disease   6. Hemochromatosis, unspecified hemochromatosis type   7. History of prostate cancer    ASSESSMENT AND PLAN: Mr. GTyland Klemensis a 65year old white male who has a history of hypertension and significant family history for CAD. He has been demonstrated to have normal systolic function with moderate left ventricular hypertrophy and  evidence for grade 1 diastolic dysfunction on echo Doppler.  He has a history of hypertension and his blood pressure continues to be stable on his current regimen of losartan 50 mg and HCTZ 12.5 mg.  He underwent a coronary CT scoring study which shows a calcium score of 240 representing 68th percentile for age and sex matched control.  LDL cholesterol was 132 in July 2021 and since that time he has been on rosuvastatin 20 mg.  He also has had significant improvement in his diet.  He continues to walk 5 to 7 days/week.  I am recommending fasting comprehensive metabolic panel and lipid studies for reassessment.  He is not fasting today and these will be done over the next several days.  With his coronary calcification,  target LDL is less than 70.  He is followed by Dr. Marin Olp for hemochromatosis.  Recent laboratory on September 14, 2020 showed an iron saturation of 31 Dr. Michaela Corner for full is to keep ferritin levels below 50 and iron saturation below 30%.  He underwent a unit phlebotomy.  He is breathing well and not having any asthma symptoms.  He continues to be followed by Dr. Diona Fanti regarding his history of prostate CA.  He sees Dr. Inda Merlin for primary care and is on gabapentin with his hand numbness and his history of prior C 3-4-5 disc surgery.  I will see him in 6 months for reevaluation   [place] Sine, MD, [facility] 10/12/2020 3:47 PM

## 2020-10-11 LAB — LIPID PANEL
Chol/HDL Ratio: 2.5 ratio (ref 0.0–5.0)
Cholesterol, Total: 118 mg/dL (ref 100–199)
HDL: 47 mg/dL (ref >39)
LDL Chol Calc (NIH): 59 mg/dL (ref 0–99)
Triglycerides: 52 mg/dL (ref 0–149)
VLDL Cholesterol Cal: 12 mg/dL (ref 5–40)

## 2020-10-11 LAB — COMPREHENSIVE METABOLIC PANEL
ALT: 13 IU/L (ref 0–44)
AST: 22 IU/L (ref 0–40)
Albumin/Globulin Ratio: 1.9 (ref 1.2–2.2)
Albumin: 4.4 g/dL (ref 3.8–4.8)
Alkaline Phosphatase: 79 IU/L (ref 44–121)
BUN/Creatinine Ratio: 21 (ref 10–24)
BUN: 21 mg/dL (ref 8–27)
Bilirubin Total: 0.3 mg/dL (ref 0.0–1.2)
CO2: 25 mmol/L (ref 20–29)
Calcium: 9.3 mg/dL (ref 8.6–10.2)
Chloride: 102 mmol/L (ref 96–106)
Creatinine, Ser: 1.01 mg/dL (ref 0.76–1.27)
Globulin, Total: 2.3 g/dL (ref 1.5–4.5)
Glucose: 93 mg/dL (ref 65–99)
Potassium: 4.3 mmol/L (ref 3.5–5.2)
Sodium: 141 mmol/L (ref 134–144)
Total Protein: 6.7 g/dL (ref 6.0–8.5)
eGFR: 82 mL/min/{1.73_m2} (ref >59)

## 2020-10-12 ENCOUNTER — Encounter: Payer: Self-pay | Admitting: Cardiovascular Disease

## 2020-12-07 ENCOUNTER — Telehealth: Payer: Self-pay

## 2021-01-27 ENCOUNTER — Other Ambulatory Visit: Payer: Self-pay

## 2021-01-27 ENCOUNTER — Ambulatory Visit
Admission: RE | Admit: 2021-01-27 | Discharge: 2021-01-27 | Disposition: A | Discharge status: Home / Self Care | Payer: BLUE CROSS/BLUE SHIELD | Source: Ambulatory Visit | Attending: Internal Medicine | Admitting: Internal Medicine

## 2021-01-27 ENCOUNTER — Other Ambulatory Visit: Payer: Self-pay | Admitting: Internal Medicine

## 2021-01-27 DIAGNOSIS — J45909 Unspecified asthma, uncomplicated: Secondary | ICD-10-CM

## 2021-01-31 ENCOUNTER — Other Ambulatory Visit: Payer: Self-pay | Admitting: Physician Assistant

## 2021-01-31 DIAGNOSIS — R1312 Dysphagia, oropharyngeal phase: Secondary | ICD-10-CM

## 2021-02-01 ENCOUNTER — Ambulatory Visit
Admission: RE | Admit: 2021-02-01 | Discharge: 2021-02-01 | Disposition: A | Discharge status: Home / Self Care | Payer: BLUE CROSS/BLUE SHIELD | Source: Ambulatory Visit | Attending: Physician Assistant | Admitting: Physician Assistant

## 2021-02-01 DIAGNOSIS — R1312 Dysphagia, oropharyngeal phase: Secondary | ICD-10-CM

## 2021-02-06 ENCOUNTER — Other Ambulatory Visit (HOSPITAL_COMMUNITY): Payer: Self-pay

## 2021-02-06 ENCOUNTER — Telehealth (HOSPITAL_COMMUNITY): Payer: Self-pay

## 2021-02-06 DIAGNOSIS — R059 Cough, unspecified: Secondary | ICD-10-CM

## 2021-02-06 DIAGNOSIS — R1310 Dysphagia, unspecified: Secondary | ICD-10-CM

## 2021-02-06 NOTE — Telephone Encounter (Signed)
Attempted to contact patient to schedule OP MBS - left voicemail. ?

## 2021-02-08 NOTE — Progress Notes (Signed)
Synopsis: Referred for persistent cough by [name], MD  Subjective:   PATIENT ID: Randall Bradley GENDER: male DOB: 05-31-1956, MRN: [id number]  Chief Complaint  Patient presents with   Consult    Cough x 1 year       [name] with history of remote smoking <3-4 pack years quit 1995 social smoking, hereditary hemochromatosis undergoing therapeutic phlebotomy, ACDF 4-7 surgery 03/2020, HTN, remote smoking who is referred for cough failing to respond to [facility].  He says it's been an issue for about a year and a half. 10 years ago had a cough and was told by allergist that it was possibly due to GERD. Tried H2B and initially it was helpful. He did Barium swallow last week. Has been seeing GI recently in part for cough and this was ordered as part of workup. Getting another speech evaluation tomorrow - does endorse some trouble swallowing/aspiration. May get EGD when he goes for colonoscopy. Sees [name] at [facility]. Only functional issue he has with cough is that it wakes him up at night. 2-3 times per day coughs up a little sputum. Has been on and off Breo for years. He was rinsing mouth out when he was using.   He does have some symptomatic reflux with bitter/metallic taste when he breathes in. Currently taking only pepcid at night before bedtime. Sounds like ppi is on hold till EGD.   Never has had PFTs, asthma as a kid.   He does have some post-nasal drainage. He uses no nasal spray.   He has no accompanying dyspnea.    Retired, worked for a Journalist, newspaper, worked as a Physicist, medical up until about 2 years ago. His symptoms were not worse at all during that. No symptom worsening with hay exposure either.   No family history of lung disease.       Otherwise pertinent review of systems is negative.  Past Medical History:  Diagnosis Date   Alopecia    Arthritis    "knees"   Bilateral carpal tunnel syndrome 07/12/2020   Hematuria     Hemochromatosis    Hemochromatosis, hereditary ([place]) 03/16/2020   Hereditary hemochromatosis ([place]) monitored by PCP  dr [name] gates   history of phlebotomies -- last one 2014 (pt states body has normalized)   History of bladder stone    Hypertension    Kidney stones    Prostate cancer ([place])    UROLOGIST-- DR Diona Fanti; S/P PROSTATECTOMY 03-17-2008   Right ureteral stone    Rosacea      No family history on file.   Past Surgical History:  Procedure Laterality Date   APPENDECTOMY  1963   COLONOSCOPY     COMPLEX WOUND CLOSURE Left 03-26-2009   INDEX FINGER COMPLEX WOUND LACERATION REPAIR   CYSTOLITHOLAPAXY AND URETHRAL DILATION  10-05-2009   CYSTOSCOPY W/ RETROGRADES Right 10/11/2014   Procedure: CYSTOSCOPY WITH RETROGRADE PYELOGRAM;  Surgeon: Franchot Gallo, MD;  Location[facility];  Service: Urology;  Laterality: Right;   HOLMIUM LASER APPLICATION N/A 0000000   Procedure: HOLMIUM LASER LITHOTRIPSY, ;  Surgeon: Franchot Gallo, MD;  Location: [facility];  Service: Urology;  Laterality: N/A;   JOINT REPLACEMENT     KNEE ARTHROSCOPY Left 1995   LIPOMA EXCISION  03-04-2000   forehead   ROBOT ASSISTED LAPAROSCOPIC RADICAL PROSTATECTOMY  03-17-2008   w/  BILATERAL PELVIC LYMPHADENECTOMY (NON-NERVE SPARING)   TOTAL KNEE ARTHROPLASTY Left 04/27/2015   Procedure: TOTAL KNEE  ARTHROPLASTY;  Surgeon: [name], MD;  Location: [facility];  Service: Orthopedics;  Laterality: Left;   TOTAL KNEE ARTHROPLASTY Right 06/08/2015   TOTAL KNEE ARTHROPLASTY Right 06/08/2015   Procedure: TOTAL RIGHT KNEE ARTHROPLASTY;  Surgeon: [name], MD;  Location: [facility];  Service: Orthopedics;  Laterality: Right;    Social History   Socioeconomic History   Marital status: Married    Spouse name: Not on file   Number of children: Not on file   Years of education: Not on file   Highest education level: Not on file  Occupational History   Not on file  Tobacco  Use   Smoking status: Former    Packs/day: 0.25    Years: 15.00    Pack years: 3.75    Types: Cigarettes    Quit date: 10/07/1993    Years since quitting: 27.3   Smokeless tobacco: Never  Vaping Use   Vaping Use: Never used  Substance and Sexual Activity   Alcohol use: Yes    Comment: "I quit drinking in ~ 2004"   Drug use: No   Sexual activity: Never  Other Topics Concern   Not on file  Social History Narrative   Not on file   Social Determinants of Health   Financial Resource Strain: Not on file  Food Insecurity: Not on file  Transportation Needs: Not on file  Physical Activity: Not on file  Stress: Not on file  Social Connections: Not on file  Intimate Partner Violence: Not on file     Allergies  Allergen Reactions   Ropinirole Hcl Other (See Comments)    diffuse pruritus diffuse pruritus - 20 years   Other Other (See Comments)     Outpatient Medications Prior to Visit  Medication Sig Dispense Refill   ampicillin (PRINCIPEN) 500 MG capsule Take 500 mg by mouth 2 (two) times daily.     aspirin EC 81 MG tablet Take 81 mg by mouth daily.     cyproheptadine (PERIACTIN) 4 MG tablet Take 4 mg by mouth 2 (two) times daily as needed for allergies.     hydrochlorothiazide (HYDRODIURIL) 12.5 MG tablet Take 1 tablet by mouth every morning.     Multiple Vitamin (MULTIVITAMIN WITH MINERALS) TABS tablet Take 1 tablet by mouth daily. Reported on 06/28/2015     VITAMIN D PO Take by mouth.     BREO ELLIPTA 100-25 MCG/INH AEPB Inhale 1 puff into the lungs daily. (Patient not taking: Reported on 02/09/2021)     fexofenadine (ALLEGRA) 180 MG tablet 1 tablet (Patient not taking: Reported on 02/09/2021)     finasteride (PROSCAR) 5 MG tablet Take 1 tablet by mouth daily. (Patient not taking: Reported on 02/09/2021)     gabapentin (NEURONTIN) 300 MG capsule Take 300 mg by mouth in the morning, at noon, and at bedtime. (Patient not taking: Reported on 02/09/2021)     HYDROcodone-acetaminophen  (NORCO/VICODIN) 5-325 MG tablet Take by mouth. (Patient not taking: Reported on 02/09/2021)     losartan (COZAAR) 50 MG tablet Take 50 mg by mouth daily. (Patient not taking: Reported on 02/09/2021)     rosuvastatin (CRESTOR) 20 MG tablet Take 1 tablet (20 mg total) by mouth daily. 90 tablet 3   No facility-administered medications prior to visit.       Objective:   Physical Exam:  General appearance: 65 y.o., male, NAD, conversant  Eyes: anicteric sclerae, moist conjunctivae; no lid-lag; PERRL, tracking appropriately HENT: NCAT; oropharynx, MMM, no mucosal  ulcerations; normal hard and soft palate Neck: Trachea midline; no lymphadenopathy, no JVD Lungs: CTAB, no crackles, no wheeze, with normal respiratory effort CV: RRR, no MRGs  Abdomen: Soft, non-tender; non-distended, BS present  Extremities: No peripheral edema, radial and DP pulses present bilaterally  Skin: Normal temperature, turgor and texture; no rash Psych: Appropriate affect Neuro: Alert and oriented to person and place, no focal deficit    Vitals:   02/09/21 1102  BP: 138/80  Pulse: 84  Temp: 97.8 F (36.6 C)  TempSrc: Oral  SpO2: 98%  Weight: 197 lb 12.8 oz (89.7 kg)  Height: '6\' 2"'$  (1.88 m)   98% on RA BMI Readings from Last 3 Encounters:  02/09/21 25.40 kg/m  10/10/20 25.91 kg/m  09/14/20 26.06 kg/m   Wt Readings from Last 3 Encounters:  02/09/21 197 lb 12.8 oz (89.7 kg)  10/10/20 201 lb 12.8 oz (91.5 kg)  09/14/20 203 lb (92.1 kg)     CBC    Component Value Date/Time   WBC 5.7 09/14/2020 1153   WBC 6.3 10/08/2016 1423   RBC 5.15 09/14/2020 1153   HGB 13.8 09/14/2020 1153   HCT 42.2 09/14/2020 1153   PLT 216 09/14/2020 1153   MCV 81.9 09/14/2020 1153   MCH 26.8 09/14/2020 1153   MCHC 32.7 09/14/2020 1153   RDW 13.9 09/14/2020 1153   LYMPHSABS 2.1 09/14/2020 1153   MONOABS 0.6 09/14/2020 1153   EOSABS 0.1 09/14/2020 1153   BASOSABS 0.0 09/14/2020 1153      Chest Imaging: CT Chest  3/30 reviewed by me and remarkable for a couple small nodules and a lot of subtle micronodularity/TIB overall pretty similar in appearance (apart from new 3-79m nodules) to 2006 study  Esophagram 02/01/21: 1. Aspiration with thick liquids. 2. Esophageal dysmotility.  Pulmonary Functions Testing Results: No flowsheet data found.   Echocardiogram:  TTE 2016 with G1DD     Assessment & Plan:   # Chronic cough: Problem areas to work on likely GERD/recurrent aspiration and component of post-nasal drainage. He does not however seem to have over rhinitis on my exam so will start with ipra nasal spray. GI working up GERD component and wants to hold off on ppi till EGD. Asthma or other bronchiolar disorder possible given subtle evidence of bronchiolitis on CT Chest.   # Pulmonary nodules: as above in context of subtle TIB/bronchiolitis. Dominant nodules <638min this patient with low risk for malignancy don't require surveillance. We discussed and will refrain from surveillance imaging.  Plan: - PFTs - trial of nasal ipratropium - [facility] working up GERD/LPR/recurrent aspiration, we have requested authorization for records release     NaMaryjane HurterMD [facility] 02/09/2021 11:06 AM

## 2021-02-09 ENCOUNTER — Encounter: Payer: Self-pay | Admitting: Student

## 2021-02-09 ENCOUNTER — Ambulatory Visit (INDEPENDENT_AMBULATORY_CARE_PROVIDER_SITE_OTHER): Payer: BLUE CROSS/BLUE SHIELD | Admitting: Student

## 2021-02-09 ENCOUNTER — Other Ambulatory Visit: Payer: Self-pay

## 2021-02-09 VITALS — BP 138/80 | HR 84 | Temp 97.8°F | Ht 74.0 in | Wt 197.8 lb

## 2021-02-09 DIAGNOSIS — R0982 Postnasal drip: Secondary | ICD-10-CM | POA: Diagnosis not present

## 2021-02-09 DIAGNOSIS — R053 Chronic cough: Secondary | ICD-10-CM

## 2021-02-09 DIAGNOSIS — R918 Other nonspecific abnormal finding of lung field: Secondary | ICD-10-CM

## 2021-02-09 MED ORDER — IPRATROPIUM BROMIDE 0.03 % NA SOLN: 2.0000 | Freq: Three times a day (TID) | NASAL | 12 refills | Status: DC

## 2021-02-09 NOTE — Patient Instructions (Addendum)
-   Try ipratropium nasal spray 2 sprays each nostril 2-3 times a day for post nasal drainage component of cough - we'll see what GI thinks about GERD or other driver of laryngopharyngeal reflux - We'll schedule breathing tests  - If this stuff isn't helping [place] figure out the problem then down the road we may talk about bronchoscopy - See you in 3 months

## 2021-02-10 ENCOUNTER — Ambulatory Visit (HOSPITAL_COMMUNITY)
Admission: RE | Admit: 2021-02-10 | Discharge: 2021-02-10 | Disposition: A | Discharge status: Home / Self Care | Payer: BLUE CROSS/BLUE SHIELD | Source: Ambulatory Visit | Attending: Physician Assistant | Admitting: Physician Assistant

## 2021-02-10 DIAGNOSIS — R1310 Dysphagia, unspecified: Secondary | ICD-10-CM | POA: Diagnosis not present

## 2021-02-10 DIAGNOSIS — R059 Cough, unspecified: Secondary | ICD-10-CM | POA: Diagnosis present

## 2021-02-13 ENCOUNTER — Other Ambulatory Visit: Payer: Self-pay | Admitting: Student

## 2021-02-14 LAB — SARS CORONAVIRUS 2 (TAT 6-24 HRS): SARS Coronavirus 2: POSITIVE — AB

## 2021-02-15 ENCOUNTER — Telehealth: Payer: Self-pay | Admitting: Student

## 2021-02-15 NOTE — Telephone Encounter (Signed)
Called and discussed positive test. He has no change in his chronic cough - it is actually improved a bit since our clinic visit using nasal ipratropium. No other symptoms of covid. He will notify us if he develops symptoms but he is reluctant to try paxlovid due to potential adverse effects, he would consider alternative covid-specific therapies.  [place]

## 2021-02-20 ENCOUNTER — Other Ambulatory Visit: Payer: Self-pay | Admitting: Student

## 2021-02-21 LAB — SARS CORONAVIRUS 2 (TAT 6-24 HRS): SARS Coronavirus 2: NEGATIVE

## 2021-02-24 ENCOUNTER — Telehealth: Payer: Self-pay | Admitting: Student

## 2021-02-24 DIAGNOSIS — R053 Chronic cough: Secondary | ICD-10-CM

## 2021-02-24 NOTE — Telephone Encounter (Signed)
Spoke with pt Needs PFT reschedule  He had a false pos 8/22, test was neg on 8/29 PFT rescheduled

## 2021-03-07 ENCOUNTER — Other Ambulatory Visit: Payer: Self-pay | Admitting: Student

## 2021-03-07 LAB — SARS CORONAVIRUS 2 (TAT 6-24 HRS): SARS Coronavirus 2: NEGATIVE

## 2021-03-10 ENCOUNTER — Other Ambulatory Visit: Payer: Self-pay

## 2021-03-10 ENCOUNTER — Ambulatory Visit (INDEPENDENT_AMBULATORY_CARE_PROVIDER_SITE_OTHER): Payer: BLUE CROSS/BLUE SHIELD | Admitting: Student

## 2021-03-10 DIAGNOSIS — R053 Chronic cough: Secondary | ICD-10-CM

## 2021-03-10 LAB — PULMONARY FUNCTION TEST
DL/VA % pred: 93 %
DL/VA: 3.76 ml/min/mmHg/L
DLCO cor % pred: 94 %
DLCO cor: 28.1 ml/min/mmHg
DLCO unc % pred: 94 %
DLCO unc: 28.1 ml/min/mmHg
FEF 25-75 Post: 3.26 L/sec
FEF 25-75 Pre: 2.51 L/sec
FEF2575-%Change-Post: 29 %
FEF2575-%Pred-Post: 108 %
FEF2575-%Pred-Pre: 83 %
FEV1-%Change-Post: 6 %
FEV1-%Pred-Post: 95 %
FEV1-%Pred-Pre: 89 %
FEV1-Post: 3.71 L
FEV1-Pre: 3.47 L
FEV1FVC-%Change-Post: 3 %
FEV1FVC-%Pred-Pre: 98 %
FEV6-%Change-Post: 3 %
FEV6-%Pred-Post: 98 %
FEV6-%Pred-Pre: 95 %
FEV6-Post: 4.91 L
FEV6-Pre: 4.74 L
FEV6FVC-%Change-Post: 0 %
FEV6FVC-%Pred-Post: 105 %
FEV6FVC-%Pred-Pre: 104 %
FVC-%Change-Post: 3 %
FVC-%Pred-Post: 93 %
FVC-%Pred-Pre: 90 %
FVC-Post: 4.91 L
FVC-Pre: 4.76 L
Post FEV1/FVC ratio: 76 %
Post FEV6/FVC ratio: 100 %
Pre FEV1/FVC ratio: 73 %
Pre FEV6/FVC Ratio: 100 %
RV % pred: 117 %
RV: 3.05 L
TLC % pred: 103 %
TLC: 8.12 L

## 2021-03-10 NOTE — Patient Instructions (Signed)
Full PFT performed today. °

## 2021-03-10 NOTE — Progress Notes (Signed)
Full PFT performed today. °

## 2021-03-16 ENCOUNTER — Encounter: Payer: Self-pay | Admitting: Hematology & Oncology

## 2021-03-16 ENCOUNTER — Other Ambulatory Visit: Payer: Self-pay

## 2021-03-16 ENCOUNTER — Inpatient Hospital Stay: Payer: BLUE CROSS/BLUE SHIELD | Attending: Hematology & Oncology

## 2021-03-16 ENCOUNTER — Inpatient Hospital Stay (HOSPITAL_BASED_OUTPATIENT_CLINIC_OR_DEPARTMENT_OTHER): Payer: BLUE CROSS/BLUE SHIELD | Admitting: Hematology & Oncology

## 2021-03-16 ENCOUNTER — Other Ambulatory Visit: Payer: Self-pay | Admitting: Cardiovascular Disease

## 2021-03-16 VITALS — BP 119/105 | HR 82 | Temp 97.9°F | Resp 18 | Ht 74.0 in | Wt 203.8 lb

## 2021-03-16 DIAGNOSIS — R918 Other nonspecific abnormal finding of lung field: Secondary | ICD-10-CM | POA: Diagnosis not present

## 2021-03-16 DIAGNOSIS — Z79899 Other long term (current) drug therapy: Secondary | ICD-10-CM | POA: Insufficient documentation

## 2021-03-16 DIAGNOSIS — R059 Cough, unspecified: Secondary | ICD-10-CM | POA: Insufficient documentation

## 2021-03-16 DIAGNOSIS — C61 Malignant neoplasm of prostate: Secondary | ICD-10-CM

## 2021-03-16 LAB — CMP (CANCER CENTER ONLY)
ALT: 13 U/L (ref 0–44)
AST: 18 U/L (ref 15–41)
Albumin: 4.1 g/dL (ref 3.5–5.0)
Alkaline Phosphatase: 60 U/L (ref 38–126)
Anion gap: 7 (ref 5–15)
BUN: 23 mg/dL (ref 8–23)
CO2: 30 mmol/L (ref 22–32)
Calcium: 9.4 mg/dL (ref 8.9–10.3)
Chloride: 103 mmol/L (ref 98–111)
Creatinine: 1.12 mg/dL (ref 0.61–1.24)
GFR, Estimated: >60 mL/min (ref >60)
Glucose, Bld: 79 mg/dL (ref 70–99)
Potassium: 4.2 mmol/L (ref 3.5–5.1)
Sodium: 140 mmol/L (ref 135–145)
Total Bilirubin: 0.5 mg/dL (ref 0.3–1.2)
Total Protein: 6.6 g/dL (ref 6.5–8.1)

## 2021-03-16 LAB — CBC WITH DIFFERENTIAL (CANCER CENTER ONLY)
Abs Immature Granulocytes: 0.01 10*3/uL (ref 0.00–0.07)
Basophils Absolute: 0 10*3/uL (ref 0.0–0.1)
Basophils Relative: 1 %
Eosinophils Absolute: 0.1 10*3/uL (ref 0.0–0.5)
Eosinophils Relative: 2 %
HCT: 42 % (ref 39.0–52.0)
Hemoglobin: 13.7 g/dL (ref 13.0–17.0)
Immature Granulocytes: 0 %
Lymphocytes Relative: 33 %
Lymphs Abs: 1.6 10*3/uL (ref 0.7–4.0)
MCH: 27.7 pg (ref 26.0–34.0)
MCHC: 32.6 g/dL (ref 30.0–36.0)
MCV: 84.8 fL (ref 80.0–100.0)
Monocytes Absolute: 0.6 10*3/uL (ref 0.1–1.0)
Monocytes Relative: 13 %
Neutro Abs: 2.5 10*3/uL (ref 1.7–7.7)
Neutrophils Relative %: 51 %
Platelet Count: 192 10*3/uL (ref 150–400)
RBC: 4.95 MIL/uL (ref 4.22–5.81)
RDW: 14 % (ref 11.5–15.5)
WBC Count: 4.8 10*3/uL (ref 4.0–10.5)
nRBC: 0 % (ref 0.0–0.2)

## 2021-03-16 NOTE — Progress Notes (Signed)
Hematology and Oncology Follow Up Visit  Randall Bradley [id number] 11-22-1955 65 y.o. 03/16/2021   Principle Diagnosis:  Hereditary hemochromatosis (C282Y Homozygous)  Current Therapy:   Phlebotomy/blood donation to maintain ferritin less than 50 and iron saturation less than 30%     Interim History:  Randall Bradley is back for follow-up.  For right now, everything seems to be going pretty well.  He was retired.  He now is working.  He really is doing this is to stay busy.  He is working for a Agricultural consultant farm.  I think he is helping managing it.  He sold his house.  He is going to build a new house up in [place] in [place].  This is along the [facility].  His last iron studies back in March showed a ferritin of 16 with an iron saturation of 31%.  He recently had pulmonary function studies done.  This is for cough that he had.  He had swallowing studies done.  There may been a little bit of aspiration.  He has had no issues with fever.  He has had no COVID problems.  There is been no problems with bowels or bladder.  He has had no issues with bleeding.  There is been no leg swelling.  He has had no issues with neuropathy.  Overall, his performance status is ECOG 0.      Medications:  Current Outpatient Medications:    ampicillin (PRINCIPEN) 500 MG capsule, Take 500 mg by mouth 2 (two) times daily., Disp: , Rfl:    aspirin EC 81 MG tablet, Take 81 mg by mouth daily., Disp: , Rfl:    cyproheptadine (PERIACTIN) 4 MG tablet, Take 4 mg by mouth 2 (two) times daily as needed for allergies., Disp: , Rfl:    fexofenadine (ALLEGRA) 180 MG tablet, , Disp: , Rfl:    finasteride (PROSCAR) 5 MG tablet, Take 1 tablet by mouth daily., Disp: , Rfl:    hydrochlorothiazide (HYDRODIURIL) 12.5 MG tablet, Take 1 tablet by mouth every morning., Disp: , Rfl:    ipratropium (ATROVENT) 0.03 % nasal spray, Place 2 sprays into both nostrils 3 (three) times daily., Disp: 30 mL, Rfl: 12   Multiple  Vitamin (MULTIVITAMIN WITH MINERALS) TABS tablet, Take 1 tablet by mouth daily. Reported on 06/28/2015, Disp: , Rfl:    VITAMIN D PO, Take by mouth., Disp: , Rfl:   Allergies:  Allergies  Allergen Reactions   Ropinirole Hcl Itching    Past Medical History, Surgical history, Social history, and Family History were reviewed and updated.  Review of Systems: Review of Systems  Constitutional: Negative.   HENT:  Negative.    Eyes: Negative.   Respiratory: Negative.    Cardiovascular: Negative.   Gastrointestinal: Negative.   Endocrine: Negative.   Genitourinary: Negative.    Musculoskeletal: Negative.   Skin: Negative.   Neurological: Negative.   Hematological: Negative.   Psychiatric/Behavioral: Negative.     Physical Exam:  height is 6\' 2"  (1.88 m) and weight is 203 lb 12.8 oz (92.4 kg). His oral temperature is 97.9 F (36.6 C). His blood pressure is 119/105 (abnormal) and his pulse is 82. His respiration is 18 and oxygen saturation is 100%.   Wt Readings from Last 3 Encounters:  03/16/21 203 lb 12.8 oz (92.4 kg)  02/09/21 197 lb 12.8 oz (89.7 kg)  10/10/20 201 lb 12.8 oz (91.5 kg)    Physical Exam Vitals reviewed.  HENT:     Head:  Normocephalic and atraumatic.  Eyes:     Pupils: Pupils are equal, round, and reactive to light.  Cardiovascular:     Rate and Rhythm: Normal rate and regular rhythm.     Heart sounds: Normal heart sounds.  Pulmonary:     Effort: Pulmonary effort is normal.     Breath sounds: Normal breath sounds.  Abdominal:     General: Bowel sounds are normal.     Palpations: Abdomen is soft.  Musculoskeletal:        General: No tenderness or deformity. Normal range of motion.     Cervical back: Normal range of motion.  Lymphadenopathy:     Cervical: No cervical adenopathy.  Skin:    General: Skin is warm and dry.     Findings: No erythema or rash.  Neurological:     Mental Status: He is alert and oriented to person, place, and time.   Psychiatric:        Behavior: Behavior normal.        Thought Content: Thought content normal.        Judgment: Judgment normal.     Lab Results  Component Value Date   WBC 4.8 03/16/2021   HGB 13.7 03/16/2021   HCT 42.0 03/16/2021   MCV 84.8 03/16/2021   PLT 192 03/16/2021     Chemistry      Component Value Date/Time   NA 140 03/16/2021 0814   NA 141 10/11/2020 0841   K 4.2 03/16/2021 0814   CL 103 03/16/2021 0814   CO2 30 03/16/2021 0814   BUN 23 03/16/2021 0814   BUN 21 10/11/2020 0841   CREATININE 1.12 03/16/2021 0814   CREATININE 0.84 10/08/2016 1423      Component Value Date/Time   CALCIUM 9.4 03/16/2021 0814   ALKPHOS 60 03/16/2021 0814   AST 18 03/16/2021 0814   ALT 13 03/16/2021 0814   BILITOT 0.5 03/16/2021 0814      Impression and Plan: Randall Bradley is a very nice 65 year old white male.  He has hereditary hemochromatosis.  We will see what his iron studies show.  His MCV is trending upward a little bit.  Of note, he did have a CT scan done back in March.  There is some nodules that were 5 mm in the right lung.  He has no risk factors for lung cancer.  I think we can still see him back in 6 months.   [name], MD 9/22/20229:06 AM

## 2021-03-17 ENCOUNTER — Ambulatory Visit: Payer: BLUE CROSS/BLUE SHIELD | Admitting: Hematology & Oncology

## 2021-03-17 ENCOUNTER — Telehealth: Payer: Self-pay

## 2021-03-17 ENCOUNTER — Other Ambulatory Visit: Payer: BLUE CROSS/BLUE SHIELD

## 2021-03-17 LAB — IRON AND TIBC
Iron: 45 ug/dL (ref 42–163)
Saturation Ratios: 15 % — ABNORMAL LOW (ref 20–55)
TIBC: 295 ug/dL (ref 202–409)
UIBC: 250 ug/dL (ref 117–376)

## 2021-03-17 LAB — FERRITIN: Ferritin: 18 ng/mL — ABNORMAL LOW (ref 24–336)

## 2021-03-17 NOTE — Telephone Encounter (Signed)
-----   Message from [name], MD sent at 03/17/2021 11:08 AM EDT ----- Please call and tell him that the iron levels are fantastic.  They are nice and low where they need to be.  Randall Bradley

## 2021-03-17 NOTE — Telephone Encounter (Signed)
Advised pt via MyChart.

## 2021-03-25 DIAGNOSIS — U071 COVID-19: Secondary | ICD-10-CM

## 2021-03-25 HISTORY — DX: COVID-19: U07.1

## 2021-05-08 IMAGING — DX DG CERVICAL SPINE 2 OR 3 VIEWS
3 series · 3 of 3 positions shown · non-contrast
Comparison: None.

CLINICAL DATA: Neck pain radiating down right arm

EXAM:
CERVICAL SPINE - 2-3 VIEW

[dg cervical spine 2 or 3 views (1 of 3)]
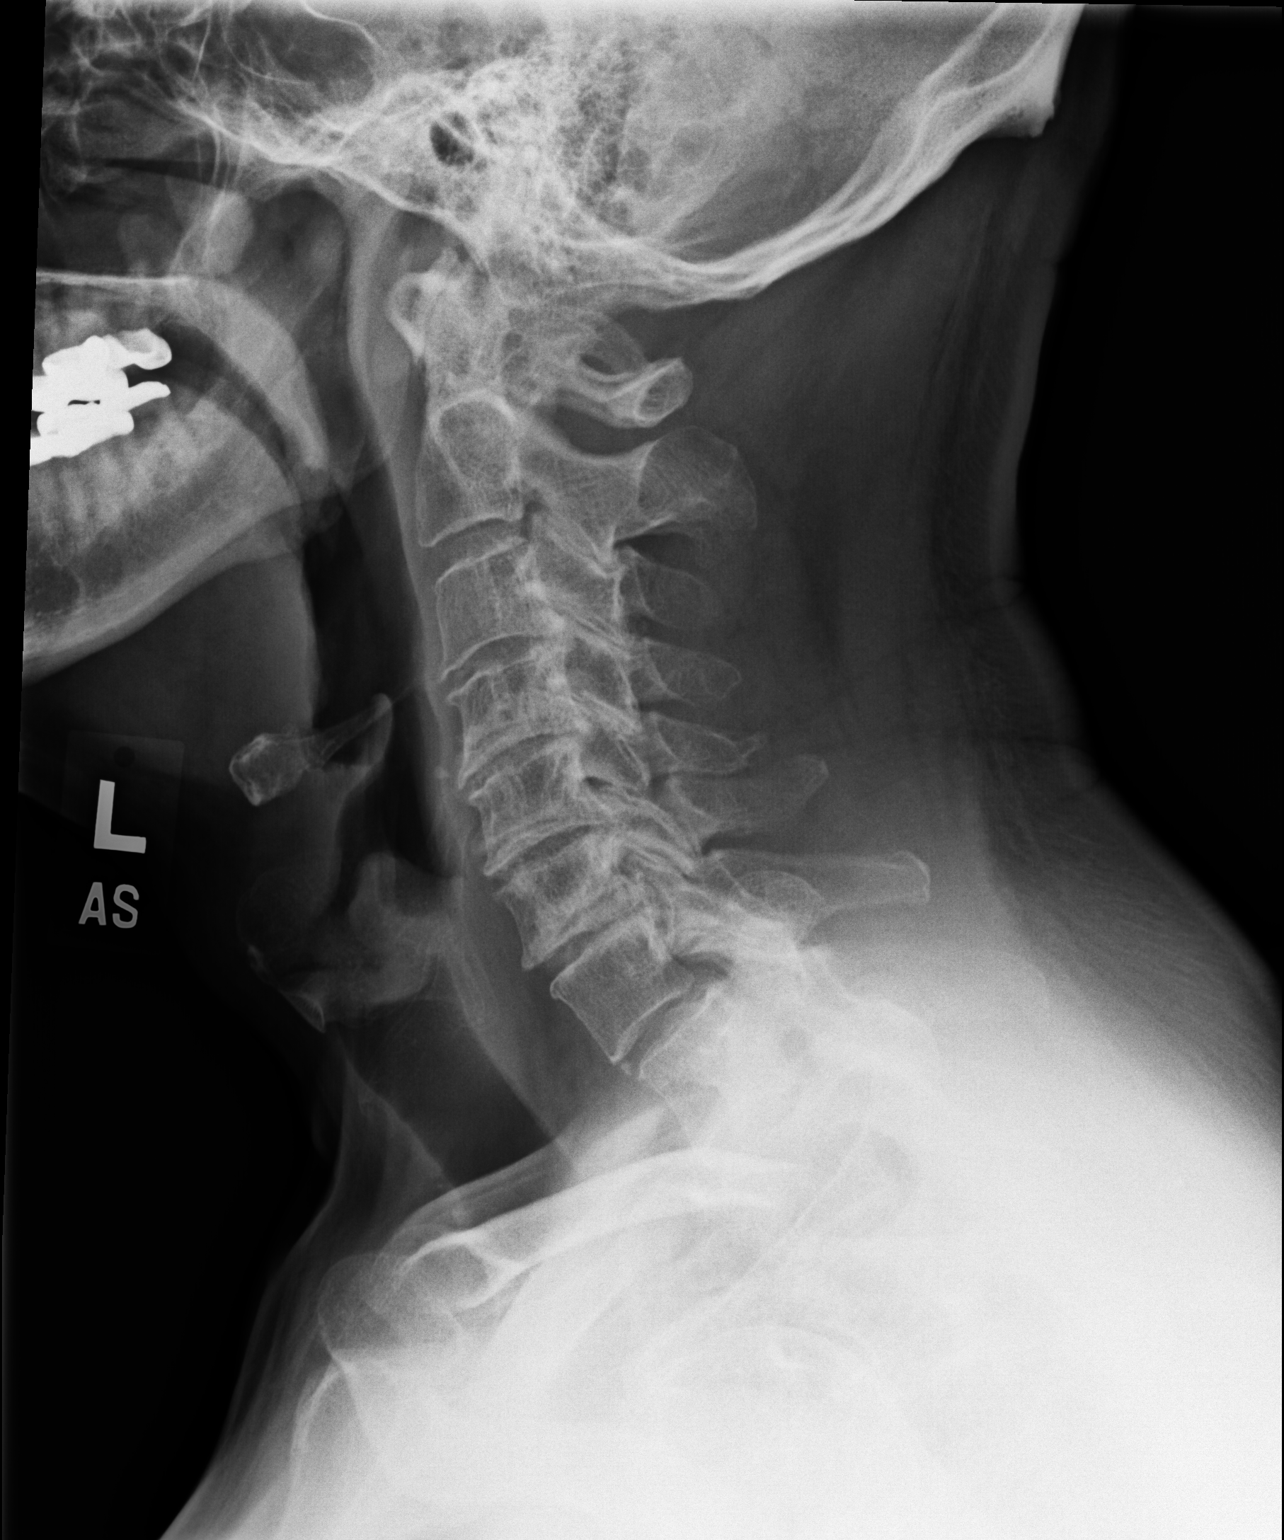

[dg cervical spine 2 or 3 views (2 of 3)]
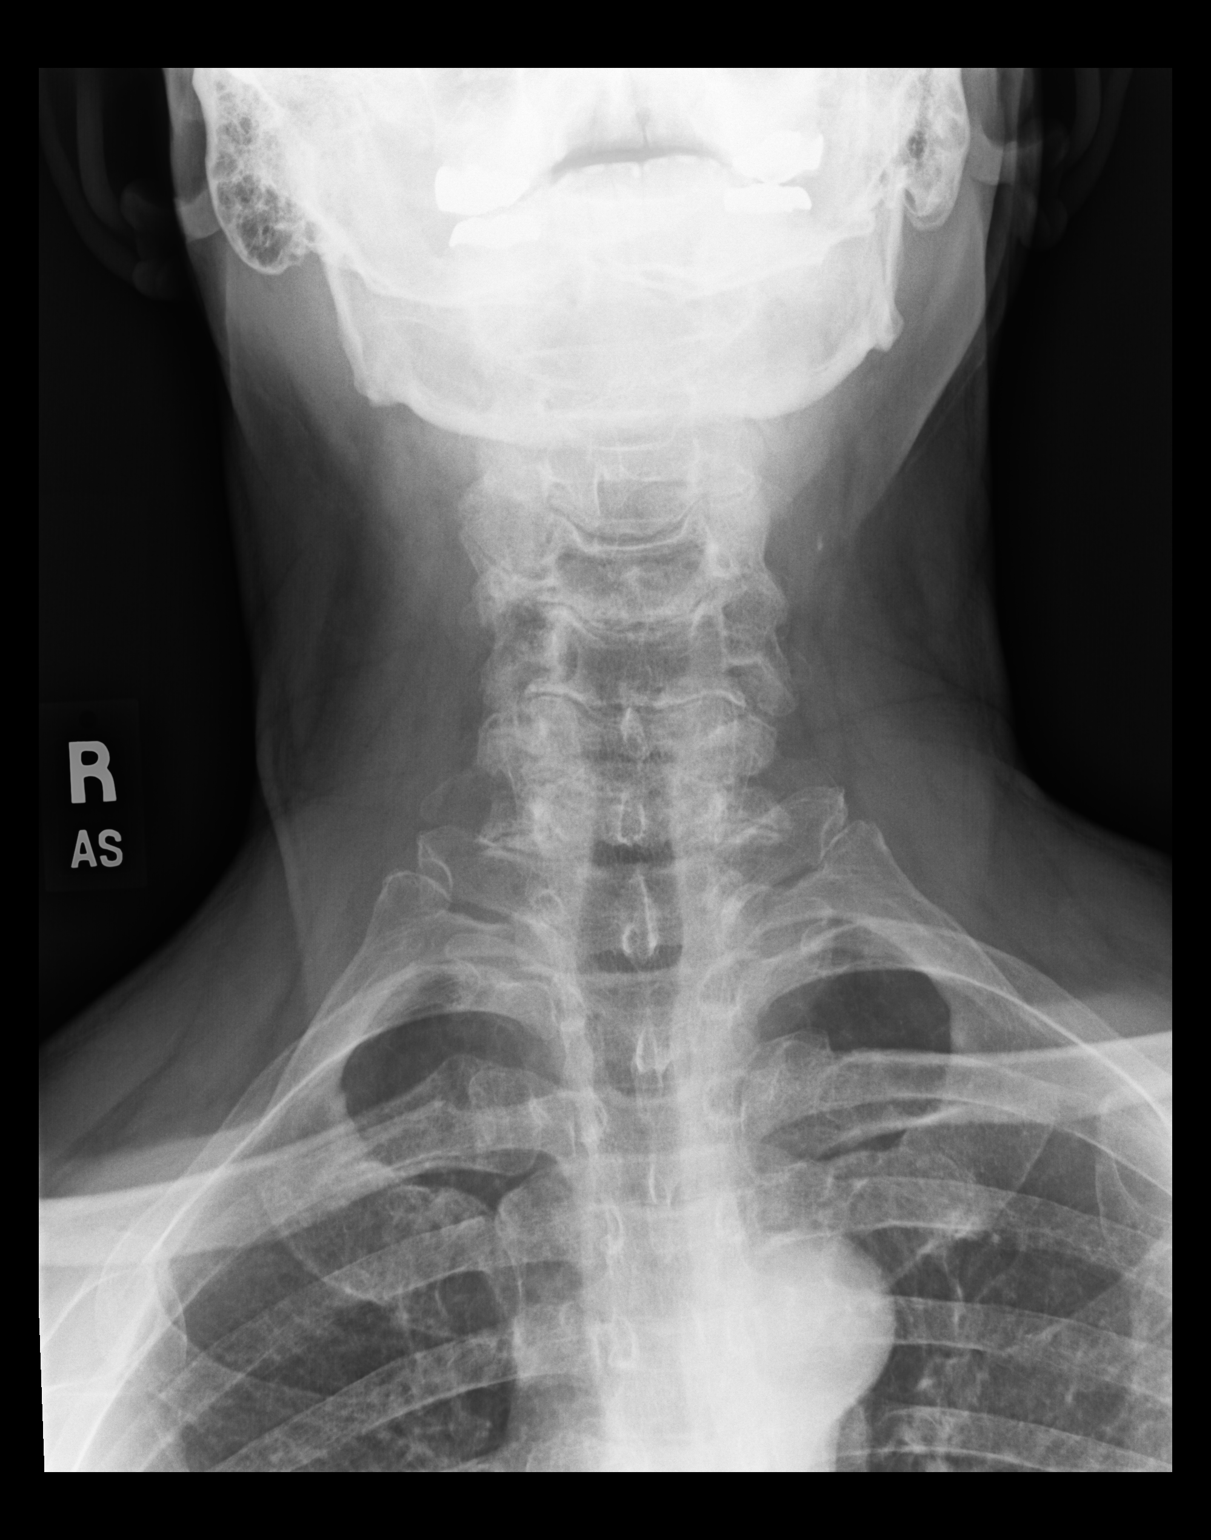

[dg cervical spine 2 or 3 views (3 of 3)]
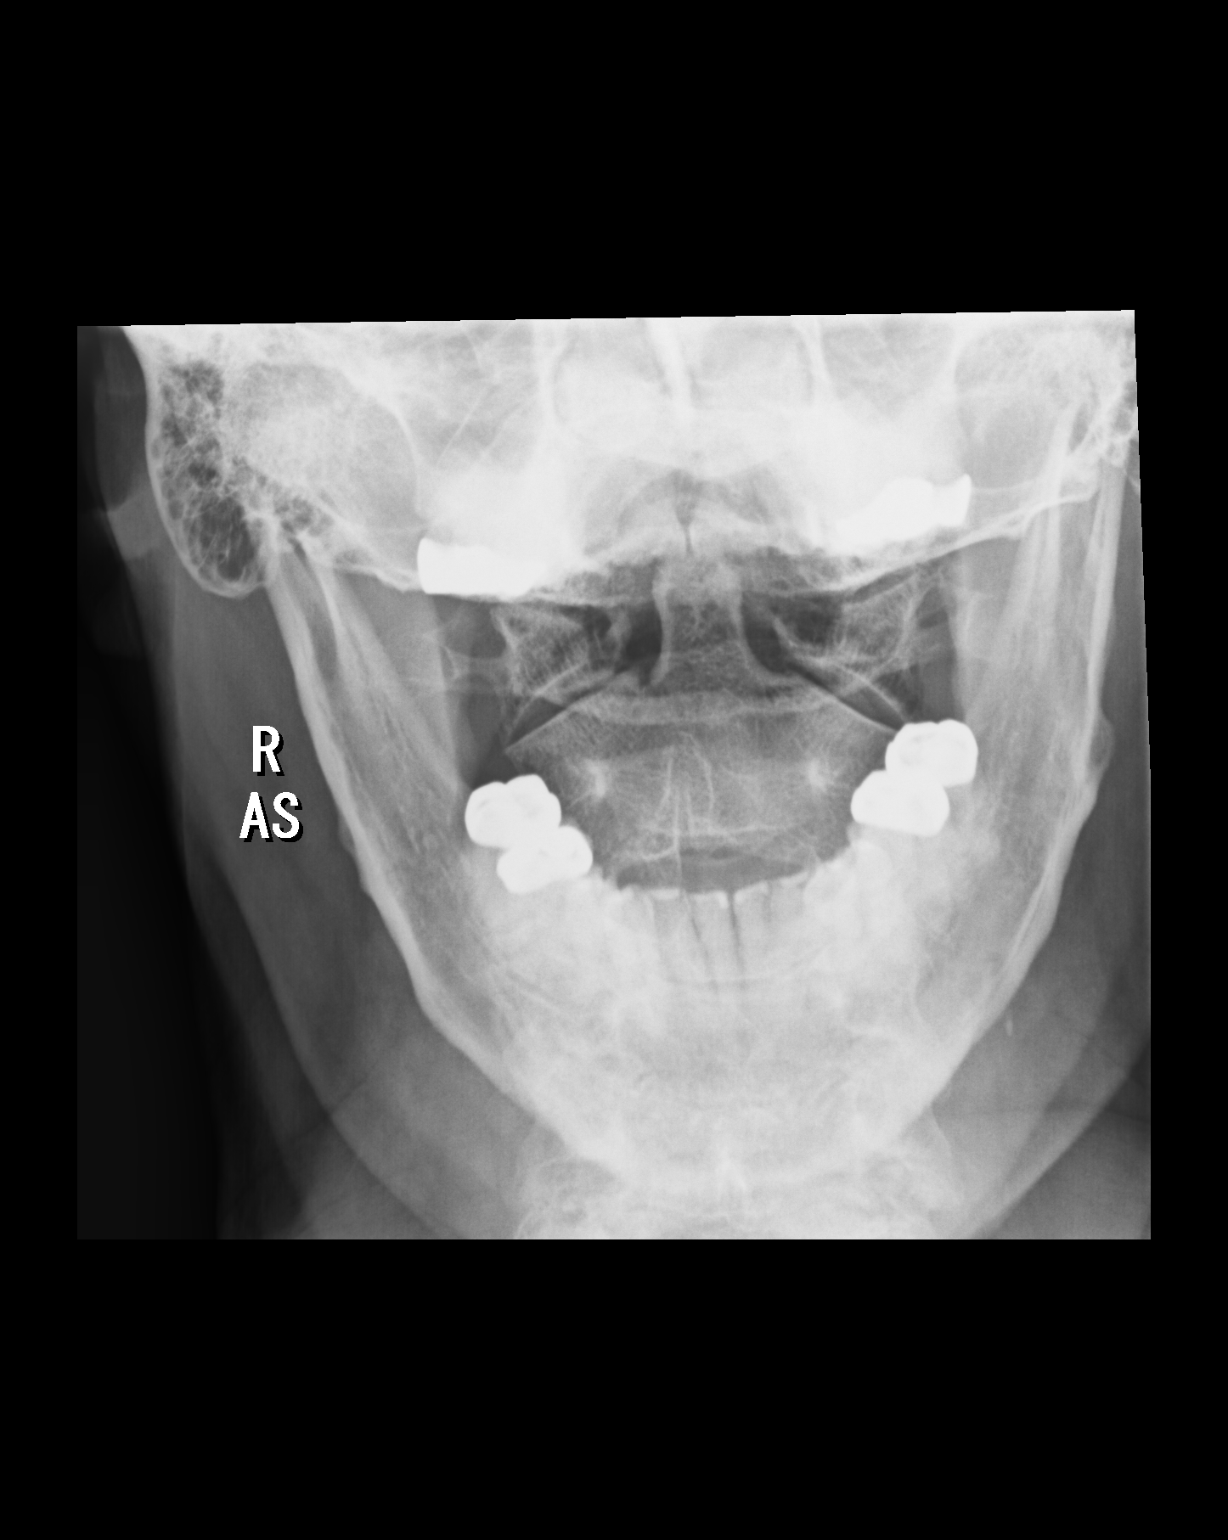

[3 of 3 positions shown; findings below may reference images not displayed]

FINDINGS: Normal cervical lordosis.

No evidence of fracture or dislocation. Vertebral body heights and
intervertebral disc spaces are maintained. Dens appears intact.
Lateral masses of C1 are symmetric.

Mild degenerative changes of the mid cervical spine.

No prevertebral soft tissue swelling.

Visualized lung apices are clear.
IMPRESSION: Negative cervical spine radiographs.

## 2021-05-21 NOTE — Progress Notes (Signed)
Synopsis: Referred for persistent cough by [name], MD  Subjective:   PATIENT ID: Randall Bradley GENDER: male DOB: [birth year], MRN: [id number]  Chief Complaint  Patient presents with   Follow-up    Patient reports that he feel he is wheezing and has increased shortness of breath with exertion.        [name] with history of remote smoking <3-4 pack years quit 1995 social smoking, hereditary hemochromatosis undergoing therapeutic phlebotomy, ACDF 4-7 surgery 03/2020, HTN, remote smoking who is referred for cough failing to respond to [facility]  He says it's been an issue for about a year and a half. 10 years ago had a cough and was told by allergist that it was possibly due to GERD. Tried H2B and initially it was helpful. He did Barium swallow last week. Has been seeing GI recently in part for cough and this was ordered as part of workup. Getting another speech evaluation tomorrow - does endorse some trouble swallowing/aspiration. May get EGD when he goes for colonoscopy. Sees [name] at [facility]. Only functional issue he has with cough is that it wakes him up at night. 2-3 times per day coughs up a little sputum. Has been on and off Breo for years. He was rinsing mouth out when he was using.   He does have some symptomatic reflux with bitter/metallic taste when he breathes in. Currently taking only pepcid at night before bedtime. Sounds like ppi is on hold till EGD.   Never has had PFTs, asthma as a kid.   He does have some post-nasal drainage. He uses no nasal spray.   He has no accompanying dyspnea.    Retired, worked for a Journalist, newspaper, worked as a Physicist, medical up until about 2 years ago. His symptoms were not worse at all during that. No symptom worsening with hay exposure either.   No family history of lung disease.   Interval HPI:  Had EGD, found 2 ulcers, is on pantoprazole twice daily. Over the last week now taking it 30 min before  eating twice daily.   Today he notes more exercise limitation than at last visit. Not catching a full breath when he'd like. Has productive cough in the morning.   Ipratropium has been helpful for PND and cough.   Otherwise pertinent review of systems is negative.  Past Medical History:  Diagnosis Date   Alopecia    Arthritis    "knees"   Bilateral carpal tunnel syndrome 07/12/2020   Hematuria    Hemochromatosis    Hemochromatosis, hereditary ([place]) 03/16/2020   Hereditary hemochromatosis ([place]) monitored by PCP  dr [name] gates   history of phlebotomies -- last one 2014 (pt states body has normalized)   History of bladder stone    Hypertension    Kidney stones    Prostate cancer ([place])    UROLOGIST-- DR Diona Fanti; S/P PROSTATECTOMY 03-17-2008   Right ureteral stone    Rosacea      No family history on file.   Past Surgical History:  Procedure Laterality Date   APPENDECTOMY  1963   COLONOSCOPY     COMPLEX WOUND CLOSURE Left 03-26-2009   INDEX FINGER COMPLEX WOUND LACERATION REPAIR   CYSTOLITHOLAPAXY AND URETHRAL DILATION  10-05-2009   CYSTOSCOPY W/ RETROGRADES Right 10/11/2014   Procedure: CYSTOSCOPY WITH RETROGRADE PYELOGRAM;  Surgeon: Franchot Gallo, MD;  Location: [facility];  Service: Urology;  Laterality: Right;   HOLMIUM LASER APPLICATION N/A 1/32/4401  Procedure: HOLMIUM LASER LITHOTRIPSY, ;  Surgeon: Franchot Gallo, MD;  Location: [facility];  Service: Urology;  Laterality: N/A;   JOINT REPLACEMENT     KNEE ARTHROSCOPY Left 1995   LIPOMA EXCISION  03-04-2000   forehead   ROBOT ASSISTED LAPAROSCOPIC RADICAL PROSTATECTOMY  03-17-2008   w/  BILATERAL PELVIC LYMPHADENECTOMY (NON-NERVE SPARING)   TOTAL KNEE ARTHROPLASTY Left 04/27/2015   Procedure: TOTAL KNEE ARTHROPLASTY;  Surgeon: [name], MD;  Location: [place];  Service: Orthopedics;  Laterality: Left;   TOTAL KNEE ARTHROPLASTY Right 06/08/2015   TOTAL KNEE ARTHROPLASTY  Right 06/08/2015   Procedure: TOTAL RIGHT KNEE ARTHROPLASTY;  Surgeon: [name], MD;  Location: [place];  Service: Orthopedics;  Laterality: Right;    Social History   Socioeconomic History   Marital status: Single    Spouse name: Not on file   Number of children: Not on file   Years of education: Not on file   Highest education level: Not on file  Occupational History   Not on file  Tobacco Use   Smoking status: Former    Packs/day: 0.25    Years: 15.00    Pack years: 3.75    Types: Cigarettes    Quit date: 10/07/1993    Years since quitting: 27.6   Smokeless tobacco: Never  Vaping Use   Vaping Use: Never used  Substance and Sexual Activity   Alcohol use: Yes    Comment: "I quit drinking in ~ 2004"   Drug use: No   Sexual activity: Never  Other Topics Concern   Not on file  Social History Narrative   Not on file   Social Determinants of Health   Financial Resource Strain: Not on file  Food Insecurity: Not on file  Transportation Needs: Not on file  Physical Activity: Not on file  Stress: Not on file  Social Connections: Not on file  Intimate Partner Violence: Not on file     No Active Allergies    Outpatient Medications Prior to Visit  Medication Sig Dispense Refill   ampicillin (PRINCIPEN) 500 MG capsule Take 500 mg by mouth 2 (two) times daily.     aspirin EC 81 MG tablet Take 81 mg by mouth daily.     cyproheptadine (PERIACTIN) 4 MG tablet Take 4 mg by mouth 2 (two) times daily as needed for allergies.     fexofenadine (ALLEGRA) 180 MG tablet      finasteride (PROSCAR) 5 MG tablet Take 1 tablet by mouth daily.     fluticasone furoate-vilanterol (BREO ELLIPTA) 100-25 MCG/ACT AEPB Inhale 1 puff into the lungs as needed.     hydrochlorothiazide (HYDRODIURIL) 12.5 MG tablet Take 1 tablet by mouth every morning.     ipratropium (ATROVENT) 0.03 % nasal spray Place 2 sprays into both nostrils 3 (three) times daily. 30 mL 12   Multiple Vitamin (MULTIVITAMIN  WITH MINERALS) TABS tablet Take 1 tablet by mouth daily. Reported on 06/28/2015     pantoprazole (PROTONIX) 40 MG tablet Take 40 mg by mouth daily.     rosuvastatin (CRESTOR) 20 MG tablet Take 20 mg by mouth daily.     VITAMIN D PO Take by mouth.     No facility-administered medications prior to visit.       Objective:   Physical Exam:  General appearance: 65 y.o., male, NAD, conversant  Eyes: anicteric sclerae, moist conjunctivae; no lid-lag; PERRL, tracking appropriately HENT: NCAT; oropharynx, MMM, no mucosal ulcerations; normal hard  and soft palate Neck: Trachea midline; no lymphadenopathy, no JVD Lungs: CTAB, no crackles, no wheeze, with normal respiratory effort CV: RRR, no MRGs  Abdomen: Soft, non-tender; non-distended, BS present  Extremities: No peripheral edema, radial and DP pulses present bilaterally  Skin: Normal temperature, turgor and texture; no rash Psych: Appropriate affect Neuro: Alert and oriented to person and place, no focal deficit    Vitals:   05/22/21 1651  BP: 118/72  Pulse: 70  Temp: 97.6 F (36.4 C)  TempSrc: Oral  SpO2: 99%  Weight: 205 lb (93 kg)  Height: 6\' 2"  (1.88 m)    99% on RA BMI Readings from Last 3 Encounters:  05/22/21 26.32 kg/m  03/16/21 26.17 kg/m  02/09/21 25.40 kg/m   Wt Readings from Last 3 Encounters:  05/22/21 205 lb (93 kg)  03/16/21 203 lb 12.8 oz (92.4 kg)  02/09/21 197 lb 12.8 oz (89.7 kg)     CBC    Component Value Date/Time   WBC 4.8 03/16/2021 0814   WBC 6.3 10/08/2016 1423   RBC 4.95 03/16/2021 0814   HGB 13.7 03/16/2021 0814   HCT 42.0 03/16/2021 0814   PLT 192 03/16/2021 0814   MCV 84.8 03/16/2021 0814   MCH 27.7 03/16/2021 0814   MCHC 32.6 03/16/2021 0814   RDW 14.0 03/16/2021 0814   LYMPHSABS 1.6 03/16/2021 0814   MONOABS 0.6 03/16/2021 0814   EOSABS 0.1 03/16/2021 0814   BASOSABS 0.0 03/16/2021 0814      Chest Imaging: CT Chest 3/30 reviewed by me and remarkable for a couple small  nodules and a lot of subtle micronodularity/TIB overall pretty similar in appearance (apart from new 3-48mm nodules) to 2006 study  Esophagram 02/01/21: 1. Aspiration with thick liquids. 2. Esophageal dysmotility.  Pulmonary Functions Testing Results: PFT Results Latest Ref Rng & Units 03/10/2021  FVC-Pre L 4.76  FVC-Predicted Pre % 90  FVC-Post L 4.91  FVC-Predicted Post % 93  Pre FEV1/FVC % % 73  Post FEV1/FCV % % 76  FEV1-Pre L 3.47  FEV1-Predicted Pre % 89  FEV1-Post L 3.71  DLCO uncorrected ml/min/mmHg 28.10  DLCO UNC% % 94  DLCO corrected ml/min/mmHg 28.10  DLCO COR %Predicted % 94  DLVA Predicted % 93  TLC L 8.12  TLC % Predicted % 103  RV % Predicted % 117   Normal PFT, normal FV loops   Echocardiogram:  TTE 2016 with G1DD     Assessment & Plan:   # DOE: Unclear etiology. In context of cough, which while improved with nasal ipra, is increasingly productive in AM raises question of asthma. Diastolic dysfunction, angina, deconditioning are alternative possibilities.  # Chronic cough: Problem areas to work on likely GERD/recurrent aspiration and component of post-nasal drainage. He does not however seem to have overt rhinitis on my exam so will start with ipra nasal spray. Has also just started BID ppi about a week ago in setting PUD. Asthma or other bronchiolar disorder possible given subtle evidence of bronchiolitis on CT Chest.   # Pulmonary nodules: as above in context of subtle TIB/bronchiolitis. Dominant nodules <86mm in this patient with low risk for malignancy don't require surveillance. We discussed and will refrain from surveillance imaging.  Plan: - methacholine challenge - continue nasal ipratropium - ppi BID per GI     Maryjane Hurter, MD [facility] 05/22/2021 5:00 PM

## 2021-05-22 ENCOUNTER — Ambulatory Visit (INDEPENDENT_AMBULATORY_CARE_PROVIDER_SITE_OTHER): Payer: BLUE CROSS/BLUE SHIELD | Admitting: Student

## 2021-05-22 ENCOUNTER — Ambulatory Visit (INDEPENDENT_AMBULATORY_CARE_PROVIDER_SITE_OTHER): Payer: BLUE CROSS/BLUE SHIELD

## 2021-05-22 ENCOUNTER — Other Ambulatory Visit: Payer: Self-pay

## 2021-05-22 ENCOUNTER — Encounter: Payer: Self-pay | Admitting: Student

## 2021-05-22 VITALS — BP 118/72 | HR 70 | Temp 97.6°F | Ht 74.0 in | Wt 205.0 lb

## 2021-05-22 DIAGNOSIS — R0609 Other forms of dyspnea: Secondary | ICD-10-CM

## 2021-05-22 NOTE — Patient Instructions (Signed)
-   you will be called to schedule methacholine challenge - chest x ray today before you leave

## 2021-07-04 ENCOUNTER — Other Ambulatory Visit: Payer: Self-pay | Admitting: Cardiovascular Disease

## 2021-08-09 ENCOUNTER — Encounter: Payer: Self-pay | Admitting: Student

## 2021-08-09 ENCOUNTER — Ambulatory Visit (INDEPENDENT_AMBULATORY_CARE_PROVIDER_SITE_OTHER): Payer: BLUE CROSS/BLUE SHIELD | Admitting: Student

## 2021-08-09 ENCOUNTER — Other Ambulatory Visit: Payer: Self-pay

## 2021-08-09 VITALS — BP 128/70 | HR 84 | Temp 97.8°F | Ht 74.0 in | Wt 198.4 lb

## 2021-08-09 DIAGNOSIS — R053 Chronic cough: Secondary | ICD-10-CM

## 2021-08-09 MED ORDER — FLUTICASONE PROPIONATE 50 MCG/ACT NA SUSP: 1.0000 | Freq: Every day | NASAL | 11 refills | Status: DC

## 2021-08-09 NOTE — Patient Instructions (Signed)
-   nasal irrigation with neti pot or take shower. Then blow out nasal crusting. Once bloody nasal crusting has subsided then start using flonase 1 spray each nare after clearing your nose of crusting following either neti pot or shower.  - see you in 8 weeks and we'll see if postnasal drainage and cough are improved, think about whether or not we need to test you for asthma

## 2021-08-09 NOTE — Progress Notes (Signed)
Synopsis: Referred for persistent cough by [name], MD  Subjective:   PATIENT ID: Randall Bradley GENDER: male DOB: Dec 31, 1955, MRN: [id number]  Chief Complaint  Patient presents with   Follow-up    Cough is slightly better since the last visit. He still has some PND and cough in the morning with green sputum. He stopped atrovent NS due to nosebleeds.        [name] with history of remote smoking <3-4 pack years quit 1995 social smoking, hereditary hemochromatosis undergoing therapeutic phlebotomy, ACDF 4-7 surgery 03/2020, HTN, remote smoking who is referred for cough failing to respond to [facility].  He says it's been an issue for about a year and a half. 10 years ago had a cough and was told by allergist that it was possibly due to GERD. Tried H2B and initially it was helpful. He did Barium swallow last week. Has been seeing GI recently in part for cough and this was ordered as part of workup. Getting another speech evaluation tomorrow - does endorse some trouble swallowing/aspiration. May get EGD when he goes for colonoscopy. Sees [name] at [facility]. Only functional issue he has with cough is that it wakes him up at night. 2-3 times per day coughs up a little sputum. Has been on and off Breo for years. He was rinsing mouth out when he was using.   He does have some symptomatic reflux with bitter/metallic taste when he breathes in. Currently taking only pepcid at night before bedtime. Sounds like ppi is on hold till EGD.   Never has had PFTs, asthma as a kid.   He does have some post-nasal drainage. He uses no nasal spray.   He has no accompanying dyspnea.    Retired, worked for a Journalist, newspaper, worked as a Physicist, medical up until about 2 years ago. His symptoms were not worse at all during that. No symptom worsening with hay exposure either. Was in [place] - had a friend who had life-threatening endemic fungal infection  there.   No family history of lung disease.   Interval HPI:  Today he says he's had some epistaxis with use of nasal atrovent, has been off of it. Cough is still better than at time of our first visit but he still has it primarily in the morning. He still wonders about ongoing trouble with postnasal drainage.   Right now he's only taking allegra at night before bed.   He is no longer on BID ppi.   He's doing overall well from DOE standpoint - able to exercise fairly strenuously.   Otherwise pertinent review of systems is negative.  Past Medical History:  Diagnosis Date   Alopecia    Arthritis    "knees"   Bilateral carpal tunnel syndrome 07/12/2020   Hematuria    Hemochromatosis    Hemochromatosis, hereditary ([place]) 03/16/2020   Hereditary hemochromatosis ([place]) monitored by PCP  dr [name] gates   history of phlebotomies -- last one 2014 (pt states body has normalized)   History of bladder stone    Hypertension    Kidney stones    Prostate cancer ([place])    UROLOGIST-- DR Diona Fanti; S/P PROSTATECTOMY 03-17-2008   Right ureteral stone    Rosacea      No family history on file.   Past Surgical History:  Procedure Laterality Date   APPENDECTOMY  1963   COLONOSCOPY     COMPLEX WOUND CLOSURE Left 03-26-2009   INDEX  FINGER COMPLEX WOUND LACERATION REPAIR   CYSTOLITHOLAPAXY AND URETHRAL DILATION  10-05-2009   CYSTOSCOPY W/ RETROGRADES Right 10/11/2014   Procedure: CYSTOSCOPY WITH RETROGRADE PYELOGRAM;  Surgeon: Franchot Gallo, MD;  Location[facility];  Service: Urology;  Laterality: Right;   HOLMIUM LASER APPLICATION N/A 9/47/6546   Procedure: HOLMIUM LASER LITHOTRIPSY, ;  Surgeon: Franchot Gallo, MD;  Location: [facility];  Service: Urology;  Laterality: N/A;   JOINT REPLACEMENT     KNEE ARTHROSCOPY Left 1995   LIPOMA EXCISION  03-04-2000   forehead   ROBOT ASSISTED LAPAROSCOPIC RADICAL PROSTATECTOMY  03-17-2008   w/  BILATERAL PELVIC  LYMPHADENECTOMY (NON-NERVE SPARING)   TOTAL KNEE ARTHROPLASTY Left 04/27/2015   Procedure: TOTAL KNEE ARTHROPLASTY;  Surgeon: [name], MD;  Location: [place];  Service: Orthopedics;  Laterality: Left;   TOTAL KNEE ARTHROPLASTY Right 06/08/2015   TOTAL KNEE ARTHROPLASTY Right 06/08/2015   Procedure: TOTAL RIGHT KNEE ARTHROPLASTY;  Surgeon: [name], MD;  Location: [place];  Service: Orthopedics;  Laterality: Right;    Social History   Socioeconomic History   Marital status: Single    Spouse name: Not on file   Number of children: Not on file   Years of education: Not on file   Highest education level: Not on file  Occupational History   Not on file  Tobacco Use   Smoking status: Former    Packs/day: 0.25    Years: 15.00    Pack years: 3.75    Types: Cigarettes    Quit date: 10/07/1993    Years since quitting: 27.8   Smokeless tobacco: Never  Vaping Use   Vaping Use: Never used  Substance and Sexual Activity   Alcohol use: Yes    Comment: "I quit drinking in ~ 2004"   Drug use: No   Sexual activity: Never  Other Topics Concern   Not on file  Social History Narrative   Not on file   Social Determinants of Health   Financial Resource Strain: Not on file  Food Insecurity: Not on file  Transportation Needs: Not on file  Physical Activity: Not on file  Stress: Not on file  Social Connections: Not on file  Intimate Partner Violence: Not on file     No Active Allergies    Outpatient Medications Prior to Visit  Medication Sig Dispense Refill   ampicillin (PRINCIPEN) 500 MG capsule Take 500 mg by mouth 2 (two) times daily.     aspirin EC 81 MG tablet Take 81 mg by mouth daily.     cyproheptadine (PERIACTIN) 4 MG tablet Take 4 mg by mouth 2 (two) times daily as needed for allergies.     fexofenadine (ALLEGRA) 180 MG tablet      finasteride (PROSCAR) 5 MG tablet Take 1 tablet by mouth daily.     hydrochlorothiazide (HYDRODIURIL) 12.5 MG tablet Take 1 tablet by  mouth every morning.     Multiple Vitamin (MULTIVITAMIN WITH MINERALS) TABS tablet Take 1 tablet by mouth daily. Reported on 06/28/2015     rosuvastatin (CRESTOR) 20 MG tablet TAKE 1 TABLET BY MOUTH EVERY DAY 90 tablet 3   VITAMIN D PO Take by mouth.     fluticasone furoate-vilanterol (BREO ELLIPTA) 100-25 MCG/ACT AEPB Inhale 1 puff into the lungs as needed.     ipratropium (ATROVENT) 0.03 % nasal spray Place 2 sprays into both nostrils 3 (three) times daily. (Patient not taking: Reported on 08/09/2021) 30 mL 12  pantoprazole (PROTONIX) 40 MG tablet Take 40 mg by mouth daily.     No facility-administered medications prior to visit.       Objective:   Physical Exam:  General appearance: 66 y.o., male, NAD, conversant  Eyes: anicteric sclerae; PERRL, tracking appropriately HENT: NCAT; MMM, inferior turbinates don't seem edematous but do have some bloody crusting bl Neck: Trachea midline; no lymphadenopathy, no JVD Lungs: no wheeze, with normal respiratory effort CV: RRR Abdomen: non-distended  Extremities: No peripheral edema, warm Skin: Normal turgor and texture; no rash Psych: Appropriate affect Neuro: Alert and oriented to person and place, no focal deficit     Vitals:   08/09/21 0906  BP: 128/70  Pulse: 84  Temp: 97.8 F (36.6 C)  TempSrc: Oral  SpO2: 97%  Weight: 198 lb 6.4 oz (90 kg)  Height: 6\' 2"  (1.88 m)    97% on RA BMI Readings from Last 3 Encounters:  08/09/21 25.47 kg/m  05/22/21 26.32 kg/m  03/16/21 26.17 kg/m   Wt Readings from Last 3 Encounters:  08/09/21 198 lb 6.4 oz (90 kg)  05/22/21 205 lb (93 kg)  03/16/21 203 lb 12.8 oz (92.4 kg)     CBC    Component Value Date/Time   WBC 4.8 03/16/2021 0814   WBC 6.3 10/08/2016 1423   RBC 4.95 03/16/2021 0814   HGB 13.7 03/16/2021 0814   HCT 42.0 03/16/2021 0814   PLT 192 03/16/2021 0814   MCV 84.8 03/16/2021 0814   MCH 27.7 03/16/2021 0814   MCHC 32.6 03/16/2021 0814   RDW 14.0 03/16/2021  0814   LYMPHSABS 1.6 03/16/2021 0814   MONOABS 0.6 03/16/2021 0814   EOSABS 0.1 03/16/2021 0814   BASOSABS 0.0 03/16/2021 0814      Chest Imaging: CT Chest 3/30 reviewed by me and remarkable for a couple small nodules and a lot of subtle micronodularity/TIB overall pretty similar in appearance (apart from new 3-39mm nodules) to 2006 study  Esophagram 02/01/21: 1. Aspiration with thick liquids. 2. Esophageal dysmotility.  Pulmonary Functions Testing Results: PFT Results Latest Ref Rng & Units 03/10/2021  FVC-Pre L 4.76  FVC-Predicted Pre % 90  FVC-Post L 4.91  FVC-Predicted Post % 93  Pre FEV1/FVC % % 73  Post FEV1/FCV % % 76  FEV1-Pre L 3.47  FEV1-Predicted Pre % 89  FEV1-Post L 3.71  DLCO uncorrected ml/min/mmHg 28.10  DLCO UNC% % 94  DLCO corrected ml/min/mmHg 28.10  DLCO COR %Predicted % 94  DLVA Predicted % 93  TLC L 8.12  TLC % Predicted % 103  RV % Predicted % 117   Normal PFT, normal FV loops   Echocardiogram:  TTE 2016 with G1DD     Assessment & Plan:    # Chronic cough: Problem areas to work on likely GERD/recurrent aspiration and component of post-nasal drainage. BID ppi didn't seem to help substantially after 8 week course. Nose dried out a little too much by nasal ipra.  # Pulmonary nodules: as above in context of subtle TIB/bronchiolitis. Dominant nodules <63mm in this patient with low risk for malignancy don't require surveillance. We discussed and will refrain from surveillance imaging.  Plan: - nasal irrigation, flonase - if unimproved next visit consideration of methacholine challenge     Maryjane Hurter, MD [facility] 08/09/2021 9:10 AM

## 2021-09-13 ENCOUNTER — Ambulatory Visit: Payer: BLUE CROSS/BLUE SHIELD | Admitting: Hematology & Oncology

## 2021-09-13 ENCOUNTER — Other Ambulatory Visit: Payer: Self-pay

## 2021-09-13 ENCOUNTER — Other Ambulatory Visit: Payer: BLUE CROSS/BLUE SHIELD

## 2021-09-13 ENCOUNTER — Encounter: Payer: Self-pay | Admitting: Hematology & Oncology

## 2021-09-13 ENCOUNTER — Inpatient Hospital Stay: Payer: BLUE CROSS/BLUE SHIELD | Attending: Hematology & Oncology

## 2021-09-13 ENCOUNTER — Inpatient Hospital Stay (HOSPITAL_BASED_OUTPATIENT_CLINIC_OR_DEPARTMENT_OTHER): Payer: BLUE CROSS/BLUE SHIELD | Admitting: Hematology & Oncology

## 2021-09-13 DIAGNOSIS — Z79899 Other long term (current) drug therapy: Secondary | ICD-10-CM | POA: Insufficient documentation

## 2021-09-13 LAB — CMP (CANCER CENTER ONLY)
ALT: 13 U/L (ref 0–44)
AST: 19 U/L (ref 15–41)
Albumin: 4.4 g/dL (ref 3.5–5.0)
Alkaline Phosphatase: 90 U/L (ref 38–126)
Anion gap: 8 (ref 5–15)
BUN: 32 mg/dL — ABNORMAL HIGH (ref 8–23)
CO2: 29 mmol/L (ref 22–32)
Calcium: 9.6 mg/dL (ref 8.9–10.3)
Chloride: 103 mmol/L (ref 98–111)
Creatinine: 1.03 mg/dL (ref 0.61–1.24)
GFR, Estimated: >60 mL/min (ref >60)
Glucose, Bld: 120 mg/dL — ABNORMAL HIGH (ref 70–99)
Potassium: 4.2 mmol/L (ref 3.5–5.1)
Sodium: 140 mmol/L (ref 135–145)
Total Bilirubin: 0.4 mg/dL (ref 0.3–1.2)
Total Protein: 7.1 g/dL (ref 6.5–8.1)

## 2021-09-13 LAB — CBC WITH DIFFERENTIAL (CANCER CENTER ONLY)
Abs Immature Granulocytes: 0.02 10*3/uL (ref 0.00–0.07)
Basophils Absolute: 0.1 10*3/uL (ref 0.0–0.1)
Basophils Relative: 1 %
Eosinophils Absolute: 0.3 10*3/uL (ref 0.0–0.5)
Eosinophils Relative: 5 %
HCT: 44.1 % (ref 39.0–52.0)
Hemoglobin: 14.4 g/dL (ref 13.0–17.0)
Immature Granulocytes: 0 %
Lymphocytes Relative: 32 %
Lymphs Abs: 2 10*3/uL (ref 0.7–4.0)
MCH: 28.2 pg (ref 26.0–34.0)
MCHC: 32.7 g/dL (ref 30.0–36.0)
MCV: 86.5 fL (ref 80.0–100.0)
Monocytes Absolute: 0.6 10*3/uL (ref 0.1–1.0)
Monocytes Relative: 9 %
Neutro Abs: 3.3 10*3/uL (ref 1.7–7.7)
Neutrophils Relative %: 53 %
Platelet Count: 198 10*3/uL (ref 150–400)
RBC: 5.1 MIL/uL (ref 4.22–5.81)
RDW: 13.7 % (ref 11.5–15.5)
WBC Count: 6.3 10*3/uL (ref 4.0–10.5)
nRBC: 0 % (ref 0.0–0.2)

## 2021-09-13 NOTE — Progress Notes (Signed)
?Hematology and Oncology Follow Up Visit ? ?Randall Bradley ?[id number] ?[birth year] 66 y.o. ?09/13/2021 ? ? ?Principle Diagnosis:  ?Hereditary hemochromatosis (C282Y Homozygous) ? ?Current Therapy:   ?Phlebotomy/blood donation to maintain ferritin less than 50 and iron saturation less than 30% ?    ?Interim History:  Randall Bradley is back for follow-up.  We saw him 6 months ago.  Since then, he has been doing quite well.  His house up in [facility] still has not been started yet.  He does have like this will get started until late Spring or early Summer. ? ?We last saw him, his ferritin was 18 and iron saturation was only 15%. ? ?He is working out.  He exercises 3 times a week. ? ?He and his wife are going to go on a [place] cruise this summer.  I am sure that they are going to have a wonderful time. ? ?He has had no problems with COVID.  He has had no fever.  He has had no change in bowel or bladder habits.  There has been no cough.  He has had no nausea or vomiting.  There has been no rashes.  He has had no leg swelling. ? ?He had a follow-up CT scan of his chest today, he still has some nodules.  They were sub-5 mm.  There is stable.  There is no recommendation for any additional scans as he would be considered low risk. ? ?Overall, his performance status is ECOG 1.  ? ?Medications:  ?Current Outpatient Medications:  ?  ampicillin (PRINCIPEN) 500 MG capsule, Take 500 mg by mouth 2 (two) times daily., Disp: , Rfl:  ?  aspirin EC 81 MG tablet, Take 81 mg by mouth daily., Disp: , Rfl:  ?  cyproheptadine (PERIACTIN) 4 MG tablet, Take 4 mg by mouth 2 (two) times daily as needed for allergies., Disp: , Rfl:  ?  fexofenadine (ALLEGRA) 180 MG tablet, , Disp: , Rfl:  ?  finasteride (PROSCAR) 5 MG tablet, Take 1 tablet by mouth daily., Disp: , Rfl:  ?  fluticasone (FLONASE) 50 MCG/ACT nasal spray, Place 1 spray into both nostrils daily., Disp: 16 g, Rfl: 11 ?  hydrochlorothiazide (HYDRODIURIL) 12.5 MG tablet, Take 1  tablet by mouth every morning., Disp: , Rfl:  ?  Multiple Vitamin (MULTIVITAMIN WITH MINERALS) TABS tablet, Take 1 tablet by mouth daily. Reported on 06/28/2015, Disp: , Rfl:  ?  rosuvastatin (CRESTOR) 20 MG tablet, TAKE 1 TABLET BY MOUTH EVERY DAY, Disp: 90 tablet, Rfl: 3 ?  VITAMIN D PO, Take by mouth., Disp: , Rfl:  ? ?Allergies:  ?No Known Allergies ? ? ?Past Medical History, Surgical history, Social history, and Family History were reviewed and updated. ? ?Review of Systems: ?Review of Systems  ?Constitutional: Negative.   ?HENT:  Negative.    ?Eyes: Negative.   ?Respiratory: Negative.    ?Cardiovascular: Negative.   ?Gastrointestinal: Negative.   ?Endocrine: Negative.   ?Genitourinary: Negative.    ?Musculoskeletal: Negative.   ?Skin: Negative.   ?Neurological: Negative.   ?Hematological: Negative.   ?Psychiatric/Behavioral: Negative.    ? ?Physical Exam: ? height is '6\' 2"'$  (1.88 m) and weight is 198 lb 12.8 oz (90.2 kg). His oral temperature is 97.6 ?F (36.4 ?C). His blood pressure is 134/93 (abnormal) and his pulse is 75. His respiration is 18 and oxygen saturation is 100%.  ? ?Wt Readings from Last 3 Encounters:  ?09/13/21 198 lb 12.8 oz (90.2 kg)  ?08/09/21 198 lb  6.4 oz (90 kg)  ?05/22/21 205 lb (93 kg)  ? ? ?Physical Exam ?Vitals reviewed.  ?HENT:  ?   Head: Normocephalic and atraumatic.  ?Eyes:  ?   Pupils: Pupils are equal, round, and reactive to light.  ?Cardiovascular:  ?   Rate and Rhythm: Normal rate and regular rhythm.  ?   Heart sounds: Normal heart sounds.  ?Pulmonary:  ?   Effort: Pulmonary effort is normal.  ?   Breath sounds: Normal breath sounds.  ?Abdominal:  ?   General: Bowel sounds are normal.  ?   Palpations: Abdomen is soft.  ?Musculoskeletal:     ?   General: No tenderness or deformity. Normal range of motion.  ?   Cervical back: Normal range of motion.  ?Lymphadenopathy:  ?   Cervical: No cervical adenopathy.  ?Skin: ?   General: Skin is warm and dry.  ?   Findings: No erythema or rash.   ?Neurological:  ?   Mental Status: He is alert and oriented to person, place, and time.  ?Psychiatric:     ?   Behavior: Behavior normal.     ?   Thought Content: Thought content normal.     ?   Judgment: Judgment normal.  ? ? ? ?Lab Results  ?Component Value Date  ? WBC 6.3 09/13/2021  ? HGB 14.4 09/13/2021  ? HCT 44.1 09/13/2021  ? MCV 86.5 09/13/2021  ? PLT 198 09/13/2021  ? ?  Chemistry   ?   ?Component Value Date/Time  ? NA 140 09/13/2021 1512  ? NA 141 10/11/2020 0841  ? K 4.2 09/13/2021 1512  ? CL 103 09/13/2021 1512  ? CO2 29 09/13/2021 1512  ? BUN 32 (H) 09/13/2021 1512  ? BUN 21 10/11/2020 0841  ? CREATININE 1.03 09/13/2021 1512  ? CREATININE 0.84 10/08/2016 1423  ?    ?Component Value Date/Time  ? CALCIUM 9.6 09/13/2021 1512  ? ALKPHOS 90 09/13/2021 1512  ? AST 19 09/13/2021 1512  ? ALT 13 09/13/2021 1512  ? BILITOT 0.4 09/13/2021 1512  ?  ? ? ?Impression and Plan: ?Randall Bradley is a very nice 66 year old white male.  He has hereditary hemochromatosis.  He has the major mutation.  He is homozygous for this.  As such, I am sure that we will, at some point have to do a phlebotomy. ? ?We will see what his iron studies show.  His MCV is trending upward a little bit. ? ?Of note, he did have a CT scan done today.  Everything looked stable.  As such, I do not think there is an indication that we have to do another scan on him. ? ?I think we can still get him back in 6 months.  We will see what his iron studies show if they have to phlebotomize him we will get him back sooner.   ? ? ?[name], MD ?3/22/20233:49 PM ?

## 2021-09-14 ENCOUNTER — Telehealth: Payer: Self-pay | Admitting: *Deleted

## 2021-09-14 LAB — IRON AND IRON BINDING CAPACITY (CC-WL,HP ONLY)
Iron: 73 ug/dL (ref 45–182)
Saturation Ratios: 23 % (ref 17.9–39.5)
TIBC: 316 ug/dL (ref 250–450)
UIBC: 243 ug/dL (ref 117–376)

## 2021-09-14 LAB — FERRITIN: Ferritin: 25 ng/mL (ref 24–336)

## 2021-09-14 NOTE — Telephone Encounter (Signed)
Patient notified per order of Dr. Marin Olp "that the iron levels are still okay.  I think the iron is still low enough that he does not need to be phlebotomized.  However, if he thinks a phlebotomy will help him, we can certainly do this.  Pete"  Pt appreciative of call and states that he does not feel as though he needs a phlebotomy at this time, but that when he does, he will contact he office.  Pt states that his skin will welt up when he is in need of a phlebotomy.  Dr. Marin Olp notified.  ?

## 2021-09-14 NOTE — Telephone Encounter (Signed)
-----   Message from [name], MD sent at 09/14/2021 11:22 AM EDT ----- ?Please call let him know that the iron levels are still okay.  I think the iron is still low enough that he does not need to be phlebotomized.  However, if he thinks a phlebotomy will help him, we can certainly do this.  Pete ?

## 2021-10-03 NOTE — Progress Notes (Signed)
? ?Synopsis: Referred for persistent cough by [name], MD ? ?Subjective:  ? ?PATIENT ID: Randall Bradley GENDER: male DOB: 11/15/1955, MRN: [id number] ? ?Chief Complaint  ?Patient presents with  ? Follow-up  ?  Cough has improved some. He states he started taking "acid pill" for "low acid" 4 x per day before meals and this has helped.   ? ? ?[name] with history of remote smoking <3-4 pack years quit 1995 social smoking, hereditary hemochromatosis undergoing therapeutic phlebotomy, ACDF 4-7 surgery 03/2020, HTN, remote smoking who is referred for cough failing to respond to [facility]. ? ?He says it's been an issue for about a year and a half. 10 years ago had a cough and was told by allergist that it was possibly due to GERD. Tried H2B and initially it was helpful. He did Barium swallow last week. Has been seeing GI recently in part for cough and this was ordered as part of workup. Getting another speech evaluation tomorrow - does endorse some trouble swallowing/aspiration. May get EGD when he goes for colonoscopy. Sees [name] at [facility]. Only functional issue he has with cough is that it wakes him up at night. 2-3 times per day coughs up a little sputum. Has been on and off Breo for years. He was rinsing mouth out when he was using.  ? ?He does have some symptomatic reflux with bitter/metallic taste when he breathes in. Currently taking only pepcid at night before bedtime. Sounds like ppi is on hold till EGD.  ? ?Never has had PFTs, asthma as a kid.  ? ?He does have some post-nasal drainage. He uses no nasal spray.  ? ?He has no accompanying dyspnea.  ? ? ?Retired, worked for a Journalist, newspaper, worked as a Physicist, medical up until about 2 years ago. His symptoms were not worse at all during that. No symptom worsening with hay exposure either. Was in [place] - had a friend who had life-threatening endemic fungal infection there.  ? ?No family history of lung  disease. ? ? ?Interval HPI: ?His cough is improved. Ended up going to [employer] in [place]. He takes betadine before a meal (which per pt stimulates acid production) which he thinks helps his cough for 4-6 hours. He says this physician strongly suspected as well that he was colonized 'heavily' with H pylori. He is still able to exercise without issue.  ? ? ?Otherwise pertinent review of systems is negative. ? ?Past Medical History:  ?Diagnosis Date  ? Alopecia   ? Arthritis   ? "knees"  ? Bilateral carpal tunnel syndrome 07/12/2020  ? Hematuria   ? Hemochromatosis   ? Hemochromatosis, hereditary ([place]) 03/16/2020  ? Hereditary hemochromatosis ([place]) monitored by PCP  dr [name] gates  ? history of phlebotomies -- last one 2014 (pt states body has normalized)  ? History of bladder stone   ? Hypertension   ? Kidney stones   ? Prostate cancer ([place])   ? UROLOGIST-- DR Diona Fanti; S/P PROSTATECTOMY 03-17-2008  ? Right ureteral stone   ? Rosacea   ?  ? ?No family history on file.  ? ?Past Surgical History:  ?Procedure Laterality Date  ? APPENDECTOMY  1963  ? COLONOSCOPY    ? COMPLEX WOUND CLOSURE Left 03-26-2009  ? INDEX FINGER COMPLEX WOUND LACERATION REPAIR  ? CYSTOLITHOLAPAXY AND URETHRAL DILATION  10-05-2009  ? CYSTOSCOPY W/ RETROGRADES Right 10/11/2014  ? Procedure: CYSTOSCOPY WITH RETROGRADE PYELOGRAM;  Surgeon: Franchot Gallo,  MD;  Location: [facility];  Service: Urology;  Laterality: Right;  ? HOLMIUM LASER APPLICATION N/A 1/44/8185  ? Procedure: HOLMIUM LASER LITHOTRIPSY, ;  Surgeon: Franchot Gallo, MD;  [facility];  Service: Urology;  Laterality: N/A;  ? JOINT REPLACEMENT    ? KNEE ARTHROSCOPY Left 1995  ? LIPOMA EXCISION  03-04-2000  ? forehead  ? ROBOT ASSISTED LAPAROSCOPIC RADICAL PROSTATECTOMY  03-17-2008  ? w/  BILATERAL PELVIC LYMPHADENECTOMY (NON-NERVE SPARING)  ? TOTAL KNEE ARTHROPLASTY Left 04/27/2015  ? Procedure: TOTAL KNEE  ARTHROPLASTY;  Surgeon: [name], MD;  Location: [facility];  Service: Orthopedics;  Laterality: Left;  ? TOTAL KNEE ARTHROPLASTY Right 06/08/2015  ? TOTAL KNEE ARTHROPLASTY Right 06/08/2015  ? Procedure: TOTAL RIGHT KNEE ARTHROPLASTY;  Surgeon: [name], MD;  Location: [facility];  Service: Orthopedics;  Laterality: Right;  ? ? ?Social History  ? ?Socioeconomic History  ? Marital status: Single  ?  Spouse name: Not on file  ? Number of children: Not on file  ? Years of education: Not on file  ? Highest education level: Not on file  ?Occupational History  ? Not on file  ?Tobacco Use  ? Smoking status: Former  ?  Packs/day: 0.25  ?  Years: 15.00  ?  Pack years: 3.75  ?  Types: Cigarettes  ?  Quit date: 10/07/1993  ?  Years since quitting: 28.0  ? Smokeless tobacco: Never  ?Vaping Use  ? Vaping Use: Never used  ?Substance and Sexual Activity  ? Alcohol use: Yes  ?  Comment: "I quit drinking in ~ 2004"  ? Drug use: No  ? Sexual activity: Never  ?Other Topics Concern  ? Not on file  ?Social History Narrative  ? Not on file  ? ?Social Determinants of Health  ? ?Financial Resource Strain: Not on file  ?Food Insecurity: Not on file  ?Transportation Needs: Not on file  ?Physical Activity: Not on file  ?Stress: Not on file  ?Social Connections: Not on file  ?Intimate Partner Violence: Not on file  ?  ? ?No Known Allergies ?  ? ?Outpatient Medications Prior to Visit  ?Medication Sig Dispense Refill  ? ampicillin (PRINCIPEN) 500 MG capsule Take 500 mg by mouth 2 (two) times daily.    ? aspirin EC 81 MG tablet Take 81 mg by mouth daily.    ? cyproheptadine (PERIACTIN) 4 MG tablet Take 4 mg by mouth 2 (two) times daily as needed for allergies.    ? finasteride (PROSCAR) 5 MG tablet Take 1 tablet by mouth daily.    ? Multiple Vitamin (MULTIVITAMIN WITH MINERALS) TABS tablet Take 1 tablet by mouth daily. Reported on 06/28/2015    ? VITAMIN D PO Take by mouth.    ? fexofenadine (ALLEGRA) 180 MG tablet     ? fluticasone (FLONASE)  50 MCG/ACT nasal spray Place 1 spray into both nostrils daily. 16 g 11  ? hydrochlorothiazide (HYDRODIURIL) 12.5 MG tablet Take 1 tablet by mouth every morning.    ? rosuvastatin (CRESTOR) 20 MG tablet TAKE 1 TABLET BY MOUTH EVERY DAY 90 tablet 3  ? ?No facility-administered medications prior to visit.  ? ? ? ? ? ?Objective:  ? ?Physical Exam: ? ?General appearance: 66 y.o., male, NAD, conversant  ?Eyes: anicteric sclerae; PERRL, tracking appropriately ?HENT: NCAT; MMM, inferior turbinates don't seem edematous but do have some bloody crusting bl ?Neck: Trachea midline; no lymphadenopathy, no JVD ?Lungs: no wheeze, with  normal respiratory effort ?CV: RRR ?Abdomen: non-distended  ?Extremities: No peripheral edema, warm ?Skin: Normal turgor and texture; no rash ?Psych: Appropriate affect ?Neuro: Alert and oriented to person and place, no focal deficit  ? ? ? ?Vitals:  ? 10/04/21 0921  ?BP: 118/74  ?Pulse: 100  ?Temp: 97.8 ?F (36.6 ?C)  ?TempSrc: Oral  ?SpO2: 98%  ?Weight: 201 lb 12.8 oz (91.5 kg)  ?Height: '6\' 2"'$  (1.88 m)  ? ? ? ?98% on RA ?BMI Readings from Last 3 Encounters:  ?10/04/21 25.91 kg/m?  ?09/13/21 25.52 kg/m?  ?08/09/21 25.47 kg/m?  ? ?Wt Readings from Last 3 Encounters:  ?10/04/21 201 lb 12.8 oz (91.5 kg)  ?09/13/21 198 lb 12.8 oz (90.2 kg)  ?08/09/21 198 lb 6.4 oz (90 kg)  ? ? ? ?CBC ?   ?Component Value Date/Time  ? WBC 6.3 09/13/2021 1512  ? WBC 6.3 10/08/2016 1423  ? RBC 5.10 09/13/2021 1512  ? HGB 14.4 09/13/2021 1512  ? HCT 44.1 09/13/2021 1512  ? PLT 198 09/13/2021 1512  ? MCV 86.5 09/13/2021 1512  ? MCH 28.2 09/13/2021 1512  ? MCHC 32.7 09/13/2021 1512  ? RDW 13.7 09/13/2021 1512  ? LYMPHSABS 2.0 09/13/2021 1512  ? MONOABS 0.6 09/13/2021 1512  ? EOSABS 0.3 09/13/2021 1512  ? BASOSABS 0.1 09/13/2021 1512  ? ? ? ? ?Chest Imaging: ?CT Chest 3/30 reviewed by me and remarkable for a couple small nodules and a lot of subtle micronodularity/TIB overall pretty similar in appearance (apart from new 3-53m  nodules) to 2006 study ? ?Esophagram 02/01/21: ?1. Aspiration with thick liquids. ?2. Esophageal dysmotility. ? ?CXR 04/2021 reviewed by me normal ? ?Pulmonary Functions Testing Results: ? ?  Latest Ref Rng & Units 9/

## 2021-10-04 ENCOUNTER — Encounter: Payer: Self-pay | Admitting: Student

## 2021-10-04 ENCOUNTER — Ambulatory Visit (INDEPENDENT_AMBULATORY_CARE_PROVIDER_SITE_OTHER): Payer: BLUE CROSS/BLUE SHIELD | Admitting: Student

## 2021-10-04 VITALS — BP 118/74 | HR 100 | Temp 97.8°F | Ht 74.0 in | Wt 201.8 lb

## 2021-10-04 DIAGNOSIS — R053 Chronic cough: Secondary | ICD-10-CM | POA: Diagnosis not present

## 2021-10-04 NOTE — Patient Instructions (Signed)
-   If the Betaine stops working for the cough, I'd be glad to see you again in clinic - just send message or call to arrange appointment! ?

## 2021-11-28 NOTE — Progress Notes (Unsigned)
Synopsis: Referred for persistent cough by [name], MD  Subjective:   PATIENT ID: Randall Bradley GENDER: male DOB: 07-15-1955, MRN: [id number]  No chief complaint on file.   66yM with history of remote smoking <3-4 pack years quit 1995 social smoking, hereditary hemochromatosis undergoing therapeutic phlebotomy, ACDF 4-7 surgery 03/2020, HTN, remote smoking who is referred for cough failing to respond to [facility].  He says it's been an issue for about a year and a half. 10 years ago had a cough and was told by allergist that it was possibly due to GERD. Tried H2B and initially it was helpful. He did Barium swallow last week. Has been seeing GI recently in part for cough and this was ordered as part of workup. Getting another speech evaluation tomorrow - does endorse some trouble swallowing/aspiration. May get EGD when he goes for colonoscopy. Sees [name] at [facility]. Only functional issue he has with cough is that it wakes him up at night. 2-3 times per day coughs up a little sputum. Has been on and off Breo for years. He was rinsing mouth out when he was using.   He does have some symptomatic reflux with bitter/metallic taste when he breathes in. Currently taking only pepcid at night before bedtime. Sounds like ppi is on hold till EGD.   Never has had PFTs, asthma as a kid.   He does have some post-nasal drainage. He uses no nasal spray.   He has no accompanying dyspnea.    Retired, worked for a Journalist, newspaper, worked as a Physicist, medical up until about 2 years ago. His symptoms were not worse at all during that. No symptom worsening with hay exposure either. Was in [place] near [place] - had a friend who had life-threatening endemic fungal infection there.   No family history of lung disease.   Interval HPI: Had stopped ppi and started betaine at last visit.    Had in past considered methacholine challenge.   Otherwise pertinent  review of systems is negative.  Past Medical History:  Diagnosis Date   Alopecia    Arthritis    "knees"   Bilateral carpal tunnel syndrome 07/12/2020   Hematuria    Hemochromatosis    Hemochromatosis, hereditary ([place]) 03/16/2020   Hereditary hemochromatosis ([place]) monitored by PCP  dr [name] gates   history of phlebotomies -- last one 2014 (pt states body has normalized)   History of bladder stone    Hypertension    Kidney stones    Prostate cancer ([place])    UROLOGIST-- DR Diona Fanti; S/P PROSTATECTOMY 03-17-2008   Right ureteral stone    Rosacea      No family history on file.   Past Surgical History:  Procedure Laterality Date   APPENDECTOMY  1963   COLONOSCOPY     COMPLEX WOUND CLOSURE Left 03-26-2009   INDEX FINGER COMPLEX WOUND LACERATION REPAIR   CYSTOLITHOLAPAXY AND URETHRAL DILATION  10-05-2009   CYSTOSCOPY W/ RETROGRADES Right 10/11/2014   Procedure: CYSTOSCOPY WITH RETROGRADE PYELOGRAM;  Surgeon: Franchot Gallo, MD;  Location: [facility];  Service: Urology;  Laterality: Right;   HOLMIUM LASER APPLICATION N/A 9/37/9024   Procedure: HOLMIUM LASER LITHOTRIPSY, ;  Surgeon: Franchot Gallo, MD;  Location: [facility];  Service: Urology;  Laterality: N/A;   JOINT REPLACEMENT     KNEE ARTHROSCOPY Left 1995   LIPOMA EXCISION  03-04-2000   forehead   ROBOT ASSISTED LAPAROSCOPIC RADICAL PROSTATECTOMY  03-17-2008  w/  BILATERAL PELVIC LYMPHADENECTOMY (NON-NERVE SPARING)   TOTAL KNEE ARTHROPLASTY Left 04/27/2015   Procedure: TOTAL KNEE ARTHROPLASTY;  Surgeon: [name], MD;  Location: [facility];  Service: Orthopedics;  Laterality: Left;   TOTAL KNEE ARTHROPLASTY Right 06/08/2015   TOTAL KNEE ARTHROPLASTY Right 06/08/2015   Procedure: TOTAL RIGHT KNEE ARTHROPLASTY;  Surgeon: [name], MD;  Location: [facility];  Service: Orthopedics;  Laterality: Right;    Social History   Socioeconomic History   Marital status: Single    Spouse  name: Not on file   Number of children: Not on file   Years of education: Not on file   Highest education level: Not on file  Occupational History   Not on file  Tobacco Use   Smoking status: Former    Packs/day: 0.25    Years: 15.00    Pack years: 3.75    Types: Cigarettes    Quit date: 10/07/1993    Years since quitting: 28.1   Smokeless tobacco: Never  Vaping Use   Vaping Use: Never used  Substance and Sexual Activity   Alcohol use: Yes    Comment: "I quit drinking in ~ 2004"   Drug use: No   Sexual activity: Never  Other Topics Concern   Not on file  Social History Narrative   Not on file   Social Determinants of Health   Financial Resource Strain: Not on file  Food Insecurity: Not on file  Transportation Needs: Not on file  Physical Activity: Not on file  Stress: Not on file  Social Connections: Not on file  Intimate Partner Violence: Not on file     No Known Allergies    Outpatient Medications Prior to Visit  Medication Sig Dispense Refill   ampicillin (PRINCIPEN) 500 MG capsule Take 500 mg by mouth 2 (two) times daily.     aspirin EC 81 MG tablet Take 81 mg by mouth daily.     cyproheptadine (PERIACTIN) 4 MG tablet Take 4 mg by mouth 2 (two) times daily as needed for allergies.     finasteride (PROSCAR) 5 MG tablet Take 1 tablet by mouth daily.     Multiple Vitamin (MULTIVITAMIN WITH MINERALS) TABS tablet Take 1 tablet by mouth daily. Reported on 06/28/2015     VITAMIN D PO Take by mouth.     No facility-administered medications prior to visit.       Objective:   Physical Exam:  General appearance: 66 y.o., male, NAD, conversant  Eyes: anicteric sclerae; PERRL, tracking appropriately HENT: NCAT; MMM, inferior turbinates don't seem edematous but do have some bloody crusting bl Neck: Trachea midline; no lymphadenopathy, no JVD Lungs: no wheeze, with normal respiratory effort CV: RRR Abdomen: non-distended  Extremities: No peripheral edema,  warm Skin: Normal turgor and texture; no rash Psych: Appropriate affect Neuro: Alert and oriented to person and place, no focal deficit     There were no vitals filed for this visit.      on RA BMI Readings from Last 3 Encounters:  10/04/21 25.91 kg/m  09/13/21 25.52 kg/m  08/09/21 25.47 kg/m   Wt Readings from Last 3 Encounters:  10/04/21 201 lb 12.8 oz (91.5 kg)  09/13/21 198 lb 12.8 oz (90.2 kg)  08/09/21 198 lb 6.4 oz (90 kg)     CBC    Component Value Date/Time   WBC 6.3 09/13/2021 1512   WBC 6.3 10/08/2016 1423   RBC 5.10 09/13/2021 1512   HGB 14.4 09/13/2021  1512   HCT 44.1 09/13/2021 1512   PLT 198 09/13/2021 1512   MCV 86.5 09/13/2021 1512   MCH 28.2 09/13/2021 1512   MCHC 32.7 09/13/2021 1512   RDW 13.7 09/13/2021 1512   LYMPHSABS 2.0 09/13/2021 1512   MONOABS 0.6 09/13/2021 1512   EOSABS 0.3 09/13/2021 1512   BASOSABS 0.1 09/13/2021 1512      Chest Imaging: CT Chest 3/30 reviewed by me and remarkable for a couple small nodules and a lot of subtle micronodularity/TIB overall pretty similar in appearance (apart from new 3-32m nodules) to 2006 study  Esophagram 02/01/21: 1. Aspiration with thick liquids. 2. Esophageal dysmotility.  CXR 04/2021 reviewed by me normal  Pulmonary Functions Testing Results:    Latest Ref Rng & Units 03/10/2021   12:01 PM  PFT Results  FVC-Pre L 4.76    FVC-Predicted Pre % 90    FVC-Post L 4.91    FVC-Predicted Post % 93    Pre FEV1/FVC % % 73    Post FEV1/FCV % % 76    FEV1-Pre L 3.47    FEV1-Predicted Pre % 89    FEV1-Post L 3.71    DLCO uncorrected ml/min/mmHg 28.10    DLCO UNC% % 94    DLCO corrected ml/min/mmHg 28.10    DLCO COR %Predicted % 94    DLVA Predicted % 93    TLC L 8.12    TLC % Predicted % 103    RV % Predicted % 117     Normal PFT, normal FV loops   Echocardiogram:  TTE 2016 with G1DD     Assessment & Plan:    # Chronic cough: Surprisingly he swears that betaine has worked  better than anything so far.  # Pulmonary nodules: as above in context of subtle TIB/bronchiolitis. Dominant nodules <641min this patient with low risk for malignancy don't require surveillance. We discussed and will refrain from surveillance imaging.  Plan: - I've never heard of betaine but he plans to continue it per his Integrative Medicine physician's recommendation especially given his treatment response to date. I did encourage him to discuss this with his GI team in context of his recent ulcer disease.    Return to care as needed  NaMaryjane HurterMD [facility] 11/28/2021 5:13 PM

## 2021-11-29 ENCOUNTER — Encounter: Payer: Self-pay | Admitting: Student

## 2021-11-29 ENCOUNTER — Ambulatory Visit (INDEPENDENT_AMBULATORY_CARE_PROVIDER_SITE_OTHER): Payer: BLUE CROSS/BLUE SHIELD | Admitting: Student

## 2021-11-29 VITALS — BP 124/86 | HR 79 | Temp 97.9°F | Ht 74.0 in | Wt 198.4 lb

## 2021-11-29 DIAGNOSIS — R053 Chronic cough: Secondary | ICD-10-CM | POA: Diagnosis not present

## 2021-11-29 MED ORDER — BENZONATATE 200 MG PO CAPS: 200.0000 mg | ORAL_CAPSULE | Freq: Three times a day (TID) | ORAL | 1 refills | Status: DC | PRN

## 2021-11-29 NOTE — Patient Instructions (Addendum)
-   prescription sent it for tessalon perles, can also try dextromethorphan as needed - see you in 4 weeks!

## 2021-12-25 ENCOUNTER — Ambulatory Visit
Admission: RE | Admit: 2021-12-25 | Discharge: 2021-12-25 | Disposition: A | Discharge status: Home / Self Care | Payer: BLUE CROSS/BLUE SHIELD | Source: Ambulatory Visit | Attending: Student | Admitting: Student

## 2021-12-25 DIAGNOSIS — R053 Chronic cough: Secondary | ICD-10-CM

## 2021-12-28 ENCOUNTER — Encounter: Payer: Self-pay | Admitting: Student

## 2021-12-31 NOTE — Progress Notes (Unsigned)
Synopsis: Referred for persistent cough by [name], MD  Subjective:   PATIENT ID: Randall Bradley GENDER: male DOB: Feb 12, 1956, MRN: [id number]  No chief complaint on file.   66yM with history of remote smoking <3-4 pack years quit 1995 social smoking, hereditary hemochromatosis undergoing therapeutic phlebotomy, ACDF 4-7 surgery 03/2020, HTN, remote smoking who is referred for cough failing to respond to [facility].  He says it's been an issue for about a year and a half. 10 years ago had a cough and was told by allergist that it was possibly due to GERD. Tried H2B and initially it was helpful. He did Barium swallow last week. Has been seeing GI recently in part for cough and this was ordered as part of workup. Getting another speech evaluation tomorrow - does endorse some trouble swallowing/aspiration. May get EGD when he goes for colonoscopy. Sees [name] at [facility]. Only functional issue he has with cough is that it wakes him up at night. 2-3 times per day coughs up a little sputum. Has been on and off Breo for years. He was rinsing mouth out when he was using.   He does have some symptomatic reflux with bitter/metallic taste when he breathes in. Currently taking only pepcid at night before bedtime. Sounds like ppi is on hold till EGD.   Never has had PFTs, asthma as a kid.   He does have some post-nasal drainage. He uses no nasal spray.   He has no accompanying dyspnea.    Retired, worked for a Journalist, newspaper, worked as a Physicist, medical up until about 2 years ago. His symptoms were not worse at all during that. No symptom worsening with hay exposure either. Was in [place] - had a friend who had life-threatening endemic fungal infection there.   No family history of lung disease.   Interval HPI:  At last visit he mentioned concern about a friend of his that died from what sounds like ARDS from infection in [place] and  wonders if something like that could happen to him. Ct Chest with bronchiolitis, 1cm nodular consolidation RUL.   Methacholine challenge not yet performed.  Otherwise pertinent review of systems is negative.  Past Medical History:  Diagnosis Date   Alopecia    Arthritis    "knees"   Bilateral carpal tunnel syndrome 07/12/2020   Hematuria    Hemochromatosis    Hemochromatosis, hereditary ([place]) 03/16/2020   Hereditary hemochromatosis ([place]) monitored by PCP  dr [name] gates   history of phlebotomies -- last one 2014 (pt states body has normalized)   History of bladder stone    Hypertension    Kidney stones    Prostate cancer ([place])    UROLOGIST-- DR Diona Fanti; S/P PROSTATECTOMY 03-17-2008   Right ureteral stone    Rosacea      No family history on file.   Past Surgical History:  Procedure Laterality Date   APPENDECTOMY  1963   COLONOSCOPY     COMPLEX WOUND CLOSURE Left 03-26-2009   INDEX FINGER COMPLEX WOUND LACERATION REPAIR   CYSTOLITHOLAPAXY AND URETHRAL DILATION  10-05-2009   CYSTOSCOPY W/ RETROGRADES Right 10/11/2014   Procedure: CYSTOSCOPY WITH RETROGRADE PYELOGRAM;  Surgeon: Franchot Gallo, MD;  Location: [facility];  Service: Urology;  Laterality: Right;   HOLMIUM LASER APPLICATION N/A 8/65/7846   Procedure: HOLMIUM LASER LITHOTRIPSY, ;  Surgeon: Franchot Gallo, MD;  Location: [facility];  Service: Urology;  Laterality: N/A;  JOINT REPLACEMENT     KNEE ARTHROSCOPY Left 1995   LIPOMA EXCISION  03-04-2000   forehead   ROBOT ASSISTED LAPAROSCOPIC RADICAL PROSTATECTOMY  03-17-2008   w/  BILATERAL PELVIC LYMPHADENECTOMY (NON-NERVE SPARING)   TOTAL KNEE ARTHROPLASTY Left 04/27/2015   Procedure: TOTAL KNEE ARTHROPLASTY;  Surgeon: [name], MD;  Location: [place];  Service: Orthopedics;  Laterality: Left;   TOTAL KNEE ARTHROPLASTY Right 06/08/2015   TOTAL KNEE ARTHROPLASTY Right 06/08/2015   Procedure: TOTAL RIGHT KNEE ARTHROPLASTY;   Surgeon: [name], MD;  Location: [place];  Service: Orthopedics;  Laterality: Right;    Social History   Socioeconomic History   Marital status: Single    Spouse name: Not on file   Number of children: Not on file   Years of education: Not on file   Highest education level: Not on file  Occupational History   Not on file  Tobacco Use   Smoking status: Former    Packs/day: 0.25    Years: 15.00    Total pack years: 3.75    Types: Cigarettes    Quit date: 10/07/1993    Years since quitting: 28.2   Smokeless tobacco: Never  Vaping Use   Vaping Use: Never used  Substance and Sexual Activity   Alcohol use: Yes    Comment: "I quit drinking in ~ 2004"   Drug use: No   Sexual activity: Never  Other Topics Concern   Not on file  Social History Narrative   Not on file   Social Determinants of Health   Financial Resource Strain: Not on file  Food Insecurity: Not on file  Transportation Needs: Not on file  Physical Activity: Not on file  Stress: Not on file  Social Connections: Not on file  Intimate Partner Violence: Not on file     No Known Allergies    Outpatient Medications Prior to Visit  Medication Sig Dispense Refill   ampicillin (PRINCIPEN) 500 MG capsule Take 500 mg by mouth 2 (two) times daily.     aspirin EC 81 MG tablet Take 81 mg by mouth daily.     benzonatate (TESSALON) 200 MG capsule Take 1 capsule (200 mg total) by mouth 3 (three) times daily as needed for cough. 30 capsule 1   cyproheptadine (PERIACTIN) 4 MG tablet Take 4 mg by mouth 2 (two) times daily as needed for allergies.     finasteride (PROSCAR) 5 MG tablet Take 1 tablet by mouth daily.     Multiple Vitamin (MULTIVITAMIN WITH MINERALS) TABS tablet Take 1 tablet by mouth daily. Reported on 06/28/2015     VITAMIN D PO Take by mouth.     No facility-administered medications prior to visit.       Objective:   Physical Exam:  General appearance: 66 y.o., male, NAD, conversant  Eyes:  anicteric sclerae; PERRL, tracking appropriately HENT: NCAT; MMM, frequent throat clearing during exam Neck: Trachea midline; no lymphadenopathy, no JVD Lungs: no wheeze, with normal respiratory effort CV: RRR Abdomen: non-distended  Extremities: No peripheral edema, warm Skin: Normal turgor and texture; no rash Psych: Appropriate affect Neuro: Alert and oriented to person and place, no focal deficit     There were no vitals filed for this visit.       on RA BMI Readings from Last 3 Encounters:  11/29/21 25.47 kg/m  10/04/21 25.91 kg/m  09/13/21 25.52 kg/m   Wt Readings from Last 3 Encounters:  11/29/21 198 lb 6.4 oz (90  kg)  10/04/21 201 lb 12.8 oz (91.5 kg)  09/13/21 198 lb 12.8 oz (90.2 kg)     CBC    Component Value Date/Time   WBC 6.3 09/13/2021 1512   WBC 6.3 10/08/2016 1423   RBC 5.10 09/13/2021 1512   HGB 14.4 09/13/2021 1512   HCT 44.1 09/13/2021 1512   PLT 198 09/13/2021 1512   MCV 86.5 09/13/2021 1512   MCH 28.2 09/13/2021 1512   MCHC 32.7 09/13/2021 1512   RDW 13.7 09/13/2021 1512   LYMPHSABS 2.0 09/13/2021 1512   MONOABS 0.6 09/13/2021 1512   EOSABS 0.3 09/13/2021 1512   BASOSABS 0.1 09/13/2021 1512      Chest Imaging: CT Chest 3/30 reviewed by me and remarkable for a couple small nodules and a lot of subtle micronodularity/TIB overall pretty similar in appearance (apart from new 3-64m nodules) to 2006 study  Esophagram 02/01/21: 1. Aspiration with thick liquids. 2. Esophageal dysmotility.  CXR 04/2021 reviewed by me normal  CT Chest 12/26/21 reviewed by me with RUL TIB bronchiolitis, 1cm nodular consolidation anterior RUL  Pulmonary Functions Testing Results:    Latest Ref Rng & Units 03/10/2021   12:01 PM  PFT Results  FVC-Pre L 4.76   FVC-Predicted Pre % 90   FVC-Post L 4.91   FVC-Predicted Post % 93   Pre FEV1/FVC % % 73   Post FEV1/FCV % % 76   FEV1-Pre L 3.47   FEV1-Predicted Pre % 89   FEV1-Post L 3.71   DLCO uncorrected  ml/min/mmHg 28.10   DLCO UNC% % 94   DLCO corrected ml/min/mmHg 28.10   DLCO COR %Predicted % 94   DLVA Predicted % 93   TLC L 8.12   TLC % Predicted % 103   RV % Predicted % 117    Normal PFT, normal FV loops   Echocardiogram:  TTE 2016 with G1DD     Assessment & Plan:    # Chronic cough: I still feel LPR with/without recurrent aspiration is most likely issue and he's been off of ppi for some time now - frequent throat clearing in clinic, prior ACDF which may increase his risk for this and fits timing for symptomatic worsening, esophagram with 01/2021 with aspiration/esophageal dysmotility, EGD with underlying ulcer disease. Haven't completely ruled out asthma/NAEB. Has had intermittent issues with PND as well. CT since last visit however again shows foci of bronchiolitis and he worries about prior exposures [place].  # DOE: Possible component of asthma but overall sounds like his activity limitation is relatively minor.  # Pulmonary nodules: as above in context of subtle TIB/bronchiolitis. Dominant nodules <692min this patient with low risk for malignancy don't require surveillance. We discussed and will refrain from surveillance imaging.  Plan: - He'd rather not start LABA/ICS empirically without clear indication so we'll check methacholine challenge - He desires CT Chest on the off chance there is ongoing indolent infectious process driving cough - flonase/neti - Declines starting singulair before allergy testing  - we may be circling back to ppi at some point for LPR. Not sure if GI ever did work up a potential esophageal dysmotility issue or if just underwent EGD. - tessalon perles/dextromethorphan prn   Return to care 4 weeks  NaMaryjane HurterMD [facility] 12/31/2021 2:57 PM

## 2021-12-31 NOTE — H&P (View-Only) (Signed)
hi  Synopsis: Referred for persistent cough by [name], MD  Subjective:   PATIENT ID: Addison Naegeli GENDER: male DOB: 1955-08-20, MRN: [id number]  Chief Complaint  Patient presents with   Follow-up    Cough is about the same- prod with white to yellow sputum.     66yM with history of remote smoking <3-4 pack years quit 1995 social smoking, hereditary hemochromatosis undergoing therapeutic phlebotomy, ACDF 4-7 surgery 03/2020, HTN, remote smoking who is referred for cough failing to respond to [facility].  He says it's been an issue for about a year and a half. 10 years ago had a cough and was told by allergist that it was possibly due to GERD. Tried H2B and initially it was helpful. He did Barium swallow last week. Has been seeing GI recently in part for cough and this was ordered as part of workup. Getting another speech evaluation tomorrow - does endorse some trouble swallowing/aspiration. May get EGD when he goes for colonoscopy. Sees [name] at [facility]. Only functional issue he has with cough is that it wakes him up at night. 2-3 times per day coughs up a little sputum. Has been on and off Breo for years. He was rinsing mouth out when he was using.   He does have some symptomatic reflux with bitter/metallic taste when he breathes in. Currently taking only pepcid at night before bedtime. Sounds like ppi is on hold till EGD.   Never has had PFTs, asthma as a kid.   He does have some post-nasal drainage. He uses no nasal spray.   He has no accompanying dyspnea.    Retired, worked for a Journalist, newspaper, worked as a Physicist, medical up until about 2 years ago. His symptoms were not worse at all during that. No symptom worsening with hay exposure either. [place] had [place] friend who had life-threatening endemic fungal infection there.   No family history of lung disease.   Interval HPI:  At last visit  he mentioned concern about a friend of his that died from what sounds like ARDS from infection in [place] and wonders if something like that could happen to him. Ct Chest with bronchiolitis, 1cm nodular consolidation RUL.   Still dealing with productive cough throughout the day. Yellowish sputum. Sometimes has trouble breathing when inclined in certain positions. No fever. No night sweats drenching sheets. Has been trying to lose weight and has lost about 14 lbs over the last year.   He was on a cruise the last 2 weeks of June and did just feel 'really sick' for a day. Wonders if he may have had virus.   Methacholine challenge not yet performed.  Otherwise pertinent review of systems is negative.  Past Medical History:  Diagnosis Date   Alopecia    Arthritis    "knees"   Bilateral carpal tunnel syndrome 07/12/2020   Hematuria    Hemochromatosis    Hemochromatosis, hereditary ([place]) 03/16/2020   Hereditary hemochromatosis ([place]) monitored by PCP  dr [name] gates   history of phlebotomies -- last one 2014 (pt states body has normalized)   History of bladder stone    Hypertension    Kidney stones    Prostate cancer ([place])    UROLOGIST-- DR Diona Fanti; S/P PROSTATECTOMY 03-17-2008   Right ureteral stone    Rosacea      No family history on file.   Past Surgical History:  Procedure Laterality  Date   APPENDECTOMY  1963   COLONOSCOPY     COMPLEX WOUND CLOSURE Left 03-26-2009   INDEX FINGER COMPLEX WOUND LACERATION REPAIR   CYSTOLITHOLAPAXY AND URETHRAL DILATION  10-05-2009   CYSTOSCOPY W/ RETROGRADES Right 10/11/2014   Procedure: CYSTOSCOPY WITH RETROGRADE PYELOGRAM;  Surgeon: Franchot Gallo, MD;  Location: [facility];  Service: Urology;  Laterality: Right;   HOLMIUM LASER APPLICATION N/A 9/48/5462   Procedure: HOLMIUM LASER LITHOTRIPSY, ;  Surgeon: Franchot Gallo, MD;  Location: [facility];  Service: Urology;  Laterality: N/A;   JOINT REPLACEMENT      KNEE ARTHROSCOPY Left 1995   LIPOMA EXCISION  03-04-2000   forehead   ROBOT ASSISTED LAPAROSCOPIC RADICAL PROSTATECTOMY  03-17-2008   w/  BILATERAL PELVIC LYMPHADENECTOMY (NON-NERVE SPARING)   TOTAL KNEE ARTHROPLASTY Left 04/27/2015   Procedure: TOTAL KNEE ARTHROPLASTY;  Surgeon: [name], MD;  Location: [place];  Service: Orthopedics;  Laterality: Left;   TOTAL KNEE ARTHROPLASTY Right 06/08/2015   TOTAL KNEE ARTHROPLASTY Right 06/08/2015   Procedure: TOTAL RIGHT KNEE ARTHROPLASTY;  Surgeon: [name], MD;  Location: [place];  Service: Orthopedics;  Laterality: Right;    Social History   Socioeconomic History   Marital status: Single    Spouse name: Not on file   Number of children: Not on file   Years of education: Not on file   Highest education level: Not on file  Occupational History   Not on file  Tobacco Use   Smoking status: Former    Packs/day: 0.25    Years: 15.00    Total pack years: 3.75    Types: Cigarettes    Quit date: 10/07/1993    Years since quitting: 28.2   Smokeless tobacco: Never  Vaping Use   Vaping Use: Never used  Substance and Sexual Activity   Alcohol use: Yes    Comment: "I quit drinking in ~ 2004"   Drug use: No   Sexual activity: Never  Other Topics Concern   Not on file  Social History Narrative   Not on file   Social Determinants of Health   Financial Resource Strain: Not on file  Food Insecurity: Not on file  Transportation Needs: Not on file  Physical Activity: Not on file  Stress: Not on file  Social Connections: Not on file  Intimate Partner Violence: Not on file     No Known Allergies    Outpatient Medications Prior to Visit  Medication Sig Dispense Refill   ampicillin (PRINCIPEN) 500 MG capsule Take 500 mg by mouth 2 (two) times daily.     aspirin EC 81 MG tablet Take 81 mg by mouth daily.     benzonatate (TESSALON) 200 MG capsule Take 1 capsule (200 mg total) by mouth 3 (three) times daily as needed for cough.  30 capsule 1   cyproheptadine (PERIACTIN) 4 MG tablet Take 4 mg by mouth 2 (two) times daily as needed for allergies.     finasteride (PROSCAR) 5 MG tablet Take 1 tablet by mouth daily.     Multiple Vitamin (MULTIVITAMIN WITH MINERALS) TABS tablet Take 1 tablet by mouth daily. Reported on 06/28/2015     VITAMIN D PO Take by mouth.     No facility-administered medications prior to visit.       Objective:   Physical Exam:  General appearance: 66 y.o., male, NAD, conversant  Eyes: anicteric sclerae; PERRL, tracking appropriately HENT: NCAT; MMM, frequent throat clearing during exam  Neck: Trachea midline; no lymphadenopathy, no JVD Lungs: no wheeze, with normal respiratory effort CV: RRR Abdomen: non-distended  Extremities: No peripheral edema, warm Skin: Normal turgor and texture; no rash Psych: Appropriate affect Neuro: Alert and oriented to person and place, no focal deficit     Vitals:   01/01/22 1013  BP: 118/82  Pulse: 92  Temp: 98.2 F (36.8 C)  TempSrc: Oral  SpO2: 96%  Weight: 194 lb (88 kg)  Height: '6\' 2"'$  (1.88 m)       96% on RA BMI Readings from Last 3 Encounters:  01/01/22 24.91 kg/m  11/29/21 25.47 kg/m  10/04/21 25.91 kg/m   Wt Readings from Last 3 Encounters:  01/01/22 194 lb (88 kg)  11/29/21 198 lb 6.4 oz (90 kg)  10/04/21 201 lb 12.8 oz (91.5 kg)     CBC    Component Value Date/Time   WBC 6.3 09/13/2021 1512   WBC 6.3 10/08/2016 1423   RBC 5.10 09/13/2021 1512   HGB 14.4 09/13/2021 1512   HCT 44.1 09/13/2021 1512   PLT 198 09/13/2021 1512   MCV 86.5 09/13/2021 1512   MCH 28.2 09/13/2021 1512   MCHC 32.7 09/13/2021 1512   RDW 13.7 09/13/2021 1512   LYMPHSABS 2.0 09/13/2021 1512   MONOABS 0.6 09/13/2021 1512   EOSABS 0.3 09/13/2021 1512   BASOSABS 0.1 09/13/2021 1512      Chest Imaging: CT Chest 3/30 reviewed by me and remarkable for a couple small nodules and a lot of subtle micronodularity/TIB overall pretty similar in  appearance (apart from new 3-8m nodules) to 2006 study  Esophagram 02/01/21: 1. Aspiration with thick liquids. 2. Esophageal dysmotility.  CXR 04/2021 reviewed by me normal  CT Chest 12/26/21 reviewed by me with RUL TIB bronchiolitis, 1cm nodular consolidation anterior RUL  Pulmonary Functions Testing Results:    Latest Ref Rng & Units 03/10/2021   12:01 PM  PFT Results  FVC-Pre L 4.76   FVC-Predicted Pre % 90   FVC-Post L 4.91   FVC-Predicted Post % 93   Pre FEV1/FVC % % 73   Post FEV1/FCV % % 76   FEV1-Pre L 3.47   FEV1-Predicted Pre % 89   FEV1-Post L 3.71   DLCO uncorrected ml/min/mmHg 28.10   DLCO UNC% % 94   DLCO corrected ml/min/mmHg 28.10   DLCO COR %Predicted % 94   DLVA Predicted % 93   TLC L 8.12   TLC % Predicted % 103   RV % Predicted % 117    Normal PFT, normal FV loops   Echocardiogram:  TTE 2016 with G1DD     Assessment & Plan:   # Bronchiolitis: # Pulmonary nodules: May have viral URI superimposed on background of chronic granulomatous process (calcified mediastinal LAD and calcifications in spleen) or alternatively all could be result of chronic granulomatous process (endemic fungal infection, NTM, less likely something like sarcoid). He does worry about potential exposures [place]  # Chronic cough: I still feel LPR with/without recurrent aspiration is most likely issue and he's been off of ppi for some time now - frequent throat clearing in clinic, prior ACDF which may increase his risk for this and fits timing for symptomatic worsening, esophagram with 01/2021 with aspiration/esophageal dysmotility, EGD with underlying ulcer disease. Haven't completely ruled out asthma/NAEB. Has had intermittent issues with PND as well. CT since last visit however again shows foci of bronchiolitis and he worries about prior exposures overseas  as above.  # DOE: Possible component of asthma but overall sounds like his activity limitation  is relatively minor.    Plan: - we will work on getting you scheduled for bronch. Nothing to eat or drink after midnight the night before the procedure and you will need a ride home - I've ordered a bunch of tests to look for infection and I've sent referral to infectious disease. You will be called to schedule this. - flonase/neti - has declined starting singulair before allergy testing  - we may be circling back to ppi at some point for LPR. Not sure if GI ever did work up a potential esophageal dysmotility issue or if just underwent EGD - tessalon perles/dextromethorphan prn   Return to care 8 weeks  Maryjane Hurter, MD [facility] 01/01/2022 10:17 AM

## 2022-01-01 ENCOUNTER — Telehealth: Payer: Self-pay | Admitting: Student

## 2022-01-01 ENCOUNTER — Other Ambulatory Visit: Payer: Self-pay | Admitting: Student

## 2022-01-01 ENCOUNTER — Ambulatory Visit (INDEPENDENT_AMBULATORY_CARE_PROVIDER_SITE_OTHER): Payer: BLUE CROSS/BLUE SHIELD | Admitting: Student

## 2022-01-01 ENCOUNTER — Encounter: Payer: Self-pay | Admitting: Student

## 2022-01-01 VITALS — BP 118/82 | HR 92 | Temp 98.2°F | Ht 74.0 in | Wt 194.0 lb

## 2022-01-01 DIAGNOSIS — R053 Chronic cough: Secondary | ICD-10-CM | POA: Diagnosis not present

## 2022-01-01 DIAGNOSIS — J189 Pneumonia, unspecified organism: Secondary | ICD-10-CM

## 2022-01-01 NOTE — Patient Instructions (Addendum)
-   we will work on getting you scheduled for bronch. Nothing to eat or drink after midnight the night before the procedure and you will need a ride home - I've ordered a bunch of tests to look for infection and I've sent referral to infectious disease. You will be called to schedule this. - Setting up follow up tentatively in 8 weeks to go over all results

## 2022-01-01 NOTE — Telephone Encounter (Signed)
Pt scheduled for 7/14 at 8:30 at [facility].  Case # [id number].  Pt will go tomorrow to get covid test.  Spoke to pt & gave him appt info.  Nothing further needed.

## 2022-01-01 NOTE — Addendum Note (Signed)
Addended by: Suzzanne Cloud E on: 01/01/2022 11:01 AM   Modules accepted: Orders

## 2022-01-01 NOTE — Telephone Encounter (Signed)
Please schedule the following:  Provider performing procedure: Randall Bradley Diagnosis: atypical pneumonia Which side if for nodule / mass? right Procedure: fiberoptic bronchoscopy, bronchoalveolar lavage  Has patient been spoken to by Provider and given informed consent? yes Anesthesia: general Do you need Fluro? no Duration of procedure: 30 minutes Date: 01/05/22 - any time of day Alternate Date: 7/19, 7/20, 7/21 would need to be done before clinic starts at [facility] (ideally 7:30 am case)  Time: as above Location: [facility] Does patient have OSA? no DM? no Or Latex allergy? no Medication Restriction: none Anticoagulate/Antiplatelet: can continue aspirin 81 mg daily without interruption Pre-op Labs Ordered:determined by Anesthesia Imaging request: none  (If, Super[facility]ension CT Chest, please have STAT courier sent to ENDO)  Please coordinate Pre-op COVID Testing

## 2022-01-02 ENCOUNTER — Other Ambulatory Visit: Payer: BLUE CROSS/BLUE SHIELD

## 2022-01-02 ENCOUNTER — Other Ambulatory Visit: Payer: Self-pay | Admitting: Student

## 2022-01-02 ENCOUNTER — Other Ambulatory Visit: Payer: Self-pay

## 2022-01-02 DIAGNOSIS — R053 Chronic cough: Secondary | ICD-10-CM

## 2022-01-02 DIAGNOSIS — J189 Pneumonia, unspecified organism: Secondary | ICD-10-CM

## 2022-01-02 LAB — SARS CORONAVIRUS 2 (TAT 6-24 HRS): SARS Coronavirus 2: NEGATIVE

## 2022-01-03 LAB — QUANTIFERON-TB GOLD PLUS
Mitogen-NIL: >10 IU/mL
NIL: 0.03 IU/mL
QuantiFERON-TB Gold Plus: NEGATIVE
TB1-NIL: 0 IU/mL
TB2-NIL: 0 IU/mL

## 2022-01-04 ENCOUNTER — Other Ambulatory Visit: Payer: Self-pay

## 2022-01-04 ENCOUNTER — Encounter (HOSPITAL_COMMUNITY): Payer: Self-pay | Admitting: Student

## 2022-01-04 NOTE — Progress Notes (Signed)
Spoke with pt for pre-op call. Pt denies cardiac history or Diabetes. He does have a hx of HTN but is no longer on medications.   Covid test done 01/02/22 and it's negative.   Shower instructions given to pt and he voiced understanding.

## 2022-01-05 ENCOUNTER — Encounter (HOSPITAL_COMMUNITY)
Admission: RE | Disposition: A | Discharge status: Home / Self Care | Payer: Self-pay | Source: Home / Self Care | Attending: Student

## 2022-01-05 ENCOUNTER — Ambulatory Visit (HOSPITAL_COMMUNITY)
Admission: RE | Admit: 2022-01-05 | Discharge: 2022-01-05 | Disposition: A | Discharge status: Home / Self Care | Payer: BLUE CROSS/BLUE SHIELD | Attending: Student | Admitting: Student

## 2022-01-05 ENCOUNTER — Ambulatory Visit (HOSPITAL_COMMUNITY): Payer: BLUE CROSS/BLUE SHIELD

## 2022-01-05 ENCOUNTER — Ambulatory Visit (HOSPITAL_COMMUNITY): Payer: BLUE CROSS/BLUE SHIELD | Admitting: Anesthesiology

## 2022-01-05 ENCOUNTER — Encounter (HOSPITAL_COMMUNITY): Payer: Self-pay | Admitting: Student

## 2022-01-05 DIAGNOSIS — J45909 Unspecified asthma, uncomplicated: Secondary | ICD-10-CM | POA: Diagnosis not present

## 2022-01-05 DIAGNOSIS — J189 Pneumonia, unspecified organism: Secondary | ICD-10-CM | POA: Insufficient documentation

## 2022-01-05 DIAGNOSIS — I10 Essential (primary) hypertension: Secondary | ICD-10-CM | POA: Diagnosis not present

## 2022-01-05 DIAGNOSIS — Z8546 Personal history of malignant neoplasm of prostate: Secondary | ICD-10-CM | POA: Insufficient documentation

## 2022-01-05 DIAGNOSIS — A310 Pulmonary mycobacterial infection: Secondary | ICD-10-CM

## 2022-01-05 DIAGNOSIS — Z87891 Personal history of nicotine dependence: Secondary | ICD-10-CM | POA: Insufficient documentation

## 2022-01-05 DIAGNOSIS — K219 Gastro-esophageal reflux disease without esophagitis: Secondary | ICD-10-CM | POA: Insufficient documentation

## 2022-01-05 DIAGNOSIS — M1990 Unspecified osteoarthritis, unspecified site: Secondary | ICD-10-CM | POA: Insufficient documentation

## 2022-01-05 HISTORY — PX: VIDEO BRONCHOSCOPY: SHX5072

## 2022-01-05 HISTORY — DX: Gastro-esophageal reflux disease without esophagitis: K21.9

## 2022-01-05 HISTORY — PX: BRONCHIAL WASHINGS: SHX5105

## 2022-01-05 LAB — CBC
HCT: 44.5 % (ref 39.0–52.0)
Hemoglobin: 15.1 g/dL (ref 13.0–17.0)
MCH: 29.6 pg (ref 26.0–34.0)
MCHC: 33.9 g/dL (ref 30.0–36.0)
MCV: 87.3 fL (ref 80.0–100.0)
Platelets: 191 10*3/uL (ref 150–400)
RBC: 5.1 MIL/uL (ref 4.22–5.81)
RDW: 13 % (ref 11.5–15.5)
WBC: 4.2 10*3/uL (ref 4.0–10.5)
nRBC: 0 % (ref 0.0–0.2)

## 2022-01-05 LAB — BASIC METABOLIC PANEL
Anion gap: 10 (ref 5–15)
BUN: 23 mg/dL (ref 8–23)
CO2: 24 mmol/L (ref 22–32)
Calcium: 8.9 mg/dL (ref 8.9–10.3)
Chloride: 105 mmol/L (ref 98–111)
Creatinine, Ser: 1.03 mg/dL (ref 0.61–1.24)
GFR, Estimated: >60 mL/min (ref >60)
Glucose, Bld: 110 mg/dL — ABNORMAL HIGH (ref 70–99)
Potassium: 4.5 mmol/L (ref 3.5–5.1)
Sodium: 139 mmol/L (ref 135–145)

## 2022-01-05 SURGERY — VIDEO BRONCHOSCOPY WITHOUT FLUORO
Anesthesia: General | Laterality: Right

## 2022-01-05 MED ORDER — OXYCODONE HCL 5 MG PO TABS: 5.0000 mg | ORAL_TABLET | Freq: Once | ORAL | Status: DC | PRN

## 2022-01-05 MED ORDER — OXYCODONE HCL 5 MG/5ML PO SOLN: 5.0000 mg | Freq: Once | ORAL | Status: DC | PRN

## 2022-01-05 MED ORDER — LACTATED RINGERS IV SOLN: INTRAVENOUS | Status: DC

## 2022-01-05 MED ORDER — PROPOFOL 10 MG/ML IV BOLUS
INTRAVENOUS | Status: DC | PRN
  Administered 2022-01-05: 10 mg via INTRAVENOUS
  Administered 2022-01-05: 150 mg via INTRAVENOUS

## 2022-01-05 MED ORDER — ONDANSETRON HCL 4 MG/2ML IJ SOLN
INTRAMUSCULAR | Status: DC | PRN
  Administered 2022-01-05: 4 mg via INTRAVENOUS

## 2022-01-05 MED ORDER — CHLORHEXIDINE GLUCONATE 0.12 % MT SOLN
15.0000 mL | Freq: Once | OROMUCOSAL | Status: AC
Start: 2022-01-05 — End: 2022-01-05

## 2022-01-05 MED ORDER — PHENYLEPHRINE HCL (PRESSORS) 10 MG/ML IV SOLN
INTRAVENOUS | Status: DC | PRN
  Administered 2022-01-05: 80 ug via INTRAVENOUS

## 2022-01-05 MED ORDER — SUCCINYLCHOLINE CHLORIDE 200 MG/10ML IV SOSY
PREFILLED_SYRINGE | INTRAVENOUS | Status: DC | PRN
  Administered 2022-01-05: 140 mg via INTRAVENOUS

## 2022-01-05 MED ORDER — CHLORHEXIDINE GLUCONATE 0.12 % MT SOLN
OROMUCOSAL | Status: AC
  Administered 2022-01-05: 15 mL via OROMUCOSAL
  Filled 2022-01-05: qty 15

## 2022-01-05 MED ORDER — ACETAMINOPHEN 500 MG PO TABS
1000.0000 mg | ORAL_TABLET | Freq: Once | ORAL | Status: AC
  Administered 2022-01-05: 1000 mg via ORAL
  Filled 2022-01-05: qty 2

## 2022-01-05 MED ORDER — ONDANSETRON HCL 4 MG/2ML IJ SOLN: 4.0000 mg | Freq: Once | INTRAMUSCULAR | Status: DC | PRN

## 2022-01-05 MED ORDER — AMISULPRIDE (ANTIEMETIC) 5 MG/2ML IV SOLN
10.0000 mg | Freq: Once | INTRAVENOUS | Status: DC | PRN
Start: 2022-01-05 — End: 2022-01-05

## 2022-01-05 MED ORDER — FENTANYL CITRATE (PF) 100 MCG/2ML IJ SOLN: 25.0000 ug | INTRAMUSCULAR | Status: DC | PRN

## 2022-01-05 MED ORDER — MIDAZOLAM HCL 2 MG/2ML IJ SOLN
INTRAMUSCULAR | Status: DC | PRN
  Administered 2022-01-05: 2 mg via INTRAVENOUS

## 2022-01-05 MED ORDER — MIDAZOLAM HCL 2 MG/2ML IJ SOLN
INTRAMUSCULAR | Status: AC
  Filled 2022-01-05: qty 2

## 2022-01-05 MED ORDER — LIDOCAINE 2% (20 MG/ML) 5 ML SYRINGE
INTRAMUSCULAR | Status: DC | PRN
  Administered 2022-01-05: 40 mg via INTRAVENOUS
  Administered 2022-01-05: 60 mg via INTRAVENOUS

## 2022-01-05 MED ORDER — DEXAMETHASONE SODIUM PHOSPHATE 10 MG/ML IJ SOLN
INTRAMUSCULAR | Status: DC | PRN
  Administered 2022-01-05: 10 mg via INTRAVENOUS

## 2022-01-05 MED ORDER — FENTANYL CITRATE (PF) 250 MCG/5ML IJ SOLN
INTRAMUSCULAR | Status: DC | PRN
  Administered 2022-01-05: 50 ug via INTRAVENOUS

## 2022-01-05 MED ORDER — ROCURONIUM BROMIDE 10 MG/ML (PF) SYRINGE: PREFILLED_SYRINGE | INTRAVENOUS | Status: DC | PRN

## 2022-01-05 MED ORDER — FENTANYL CITRATE (PF) 250 MCG/5ML IJ SOLN
INTRAMUSCULAR | Status: AC
  Filled 2022-01-05: qty 5

## 2022-01-05 NOTE — Interval H&P Note (Signed)
History and Physical Interval Note:  01/05/2022 8:41 AM  Randall Bradley  has presented today for surgery, with the diagnosis of ATYPICAL PNEUMONIA.  The various methods of treatment have been discussed with the patient and family. After consideration of risks, benefits and other options for treatment, the patient has consented to  Procedure(s): VIDEO BRONCHOSCOPY WITHOUT FLUORO (Right) as a surgical intervention.  The patient's history has been reviewed, patient examined, no change in status, stable for surgery.  I have reviewed the patient's chart and labs.  Questions were answered to the patient's satisfaction.     Maryjane Hurter

## 2022-01-05 NOTE — Anesthesia Procedure Notes (Signed)
Procedure Name: Intubation Date/Time: 01/05/2022 9:17 AM  Performed by: Betha Loa, CRNAPre-anesthesia Checklist: Patient identified, Emergency Drugs available, Suction available and Patient being monitored Patient Re-evaluated:Patient Re-evaluated prior to induction Oxygen Delivery Method: Circle System Utilized Preoxygenation: Pre-oxygenation with 100% oxygen Induction Type: IV induction Ventilation: Mask ventilation without difficulty Laryngoscope Size: Mac and 4 Grade View: Grade I Tube type: Oral Tube size: 8.5 mm Number of attempts: 1 Airway Equipment and Method: Stylet and Oral airway Placement Confirmation: ETT inserted through vocal cords under direct vision, positive ETCO2 and breath sounds checked- equal and bilateral Secured at: 22 cm Tube secured with: Tape Dental Injury: Teeth and Oropharynx as per pre-operative assessment

## 2022-01-05 NOTE — Addendum Note (Signed)
Addendum  created 01/05/22 1250 by Betha Loa, CRNA   Intraprocedure Event edited

## 2022-01-05 NOTE — Discharge Instructions (Signed)
Let me know if you have any questions about your results before our next clinic visit - send my chart message or call [phone]

## 2022-01-05 NOTE — Anesthesia Preprocedure Evaluation (Signed)
Anesthesia Evaluation  Patient identified by MRN, date of birth, ID band Patient awake    Reviewed: Allergy & Precautions, NPO status , Patient's Chart, lab work & pertinent test results  Airway Mallampati: II  TM Distance: >3 FB Neck ROM: Full    Dental no notable dental hx.    Pulmonary asthma , former smoker,    Pulmonary exam normal breath sounds clear to auscultation       Cardiovascular hypertension, negative cardio ROS Normal cardiovascular exam Rhythm:Regular Rate:Normal     Neuro/Psych  Neuromuscular disease negative psych ROS   GI/Hepatic Neg liver ROS, GERD  ,  Endo/Other  negative endocrine ROS  Renal/GU negative Renal ROS  negative genitourinary   Musculoskeletal  (+) Arthritis , Osteoarthritis,    Abdominal   Peds negative pediatric ROS (+)  Hematology Hereditary hemochromatosis   Anesthesia Other Findings Prostate cancer  Reproductive/Obstetrics negative OB ROS                             Anesthesia Physical Anesthesia Plan  ASA: 3  Anesthesia Plan: General   Post-op Pain Management: Tylenol PO (pre-op)*   Induction: Intravenous  PONV Risk Score and Plan: 2 and Treatment may vary due to age or medical condition, Ondansetron, Dexamethasone and Midazolam  Airway Management Planned: Oral ETT and Simple Face Mask  Additional Equipment: None  Intra-op Plan:   Post-operative Plan: Extubation in OR  Informed Consent: I have reviewed the patients History and Physical, chart, labs and discussed the procedure including the risks, benefits and alternatives for the proposed anesthesia with the patient or authorized representative who has indicated his/her understanding and acceptance.       Plan Discussed with: CRNA, Anesthesiologist and Surgeon  Anesthesia Plan Comments:         Anesthesia Quick Evaluation

## 2022-01-05 NOTE — Transfer of Care (Signed)
Immediate Anesthesia Transfer of Care Note  Patient: Randall Bradley  Procedure(s) Performed: VIDEO BRONCHOSCOPY WITHOUT FLUORO (Right) BRONCHIAL WASHINGS  Patient Location: PACU  Anesthesia Type:General  Level of Consciousness: patient cooperative and responds to stimulation  Airway & Oxygen Therapy: Patient Spontanous Breathing and Patient connected to face mask oxygen  Post-op Assessment: Report given to RN and Post -op Vital signs reviewed and stable  Post vital signs: Reviewed and stable  Last Vitals:  Vitals Value Taken Time  BP    Temp    Pulse 75 01/05/22 0945  Resp 19 01/05/22 0945  SpO2 99 % 01/05/22 0945  Vitals shown include unvalidated device data.  Last Pain:  Vitals:   01/05/22 0704  TempSrc:   PainSc: 0-No pain         Complications: No notable events documented.

## 2022-01-05 NOTE — Op Note (Signed)
Bronchoscopy Procedure Note  GABOR LUSK  [id number]  04-06-1956  Date:01/05/22  Time:9:33 AM   Provider Performing:Jailen Lung M Verlee Monte   Procedure(s):  Flexible Bronchoscopy [phone]) and Flexible bronchoscopy with bronchial alveolar lavage ([id number])  Indication(s) Atypical pneumonia  Consent Risks of the procedure as well as the alternatives and risks of each were explained to the patient and/or caregiver.  Consent for the procedure was obtained and is signed in the bedside chart  Anesthesia General   Time Out Verified patient identification, verified procedure, site/side was marked, verified correct patient position, special equipment/implants available, medications/allergies/relevant history reviewed, required imaging and test results available.   Sterile Technique Usual hand hygiene, masks, gowns, and gloves were used   Procedure Description Bronchoscope advanced through endotracheal tube and into airway.  Airways were examined down to subsegmental level with findings noted below.   Following diagnostic evaluation, BAL(s) performed in RUL with normal saline and return of 25cc slightly white/cloudy fluid  Findings:  - airway pitting and crypt formation scattered throughout suggestive of chronic bronchitis - BAL as above performed in RUL and sent for micro   Complications/Tolerance None; patient tolerated the procedure well. Chest X-ray is not needed post procedure.   EBL Minimal   Specimen(s) BAL sent for micro

## 2022-01-05 NOTE — Anesthesia Postprocedure Evaluation (Signed)
Anesthesia Post Note  Patient: Randall Bradley  Procedure(s) Performed: VIDEO BRONCHOSCOPY WITHOUT FLUORO (Right) BRONCHIAL WASHINGS     Patient location during evaluation: PACU Anesthesia Type: General Level of consciousness: awake Pain management: pain level controlled Vital Signs Assessment: post-procedure vital signs reviewed and stable Respiratory status: spontaneous breathing and respiratory function stable Cardiovascular status: stable Postop Assessment: no apparent nausea or vomiting Anesthetic complications: no   No notable events documented.  Last Vitals:  Vitals:   01/05/22 1000 01/05/22 1010  BP: 119/88 117/73  Pulse: 75 76  Resp: 15 (!) 23  Temp:  36.6 C  SpO2: 97% 95%    Last Pain:  Vitals:   01/05/22 1010  TempSrc:   PainSc: 0-No pain                 Merlinda Frederick

## 2022-01-06 LAB — ACID FAST SMEAR (AFB, MYCOBACTERIA): Acid Fast Smear: NEGATIVE

## 2022-01-07 ENCOUNTER — Encounter (HOSPITAL_COMMUNITY): Payer: Self-pay | Admitting: Student

## 2022-01-07 LAB — CULTURE, BAL-QUANTITATIVE W GRAM STAIN
Culture: NO GROWTH
Gram Stain: NONE SEEN

## 2022-01-10 ENCOUNTER — Other Ambulatory Visit: Payer: Self-pay

## 2022-01-10 ENCOUNTER — Ambulatory Visit (INDEPENDENT_AMBULATORY_CARE_PROVIDER_SITE_OTHER): Payer: BLUE CROSS/BLUE SHIELD | Admitting: Internal Medicine

## 2022-01-10 DIAGNOSIS — Z9079 Acquired absence of other genital organ(s): Secondary | ICD-10-CM | POA: Diagnosis not present

## 2022-01-10 LAB — AEROBIC/ANAEROBIC CULTURE W GRAM STAIN (SURGICAL/DEEP WOUND)
Culture: NO GROWTH
Gram Stain: NONE SEEN

## 2022-01-10 NOTE — Progress Notes (Signed)
[facility] for Infectious Disease  Reason for Consult: Chronic cough and "atypical pneumonia" by CT scan Referring Provider: Dr. Leslye Peer  Assessment: Fatima Sanger chronic productive cough and progressive CT changes certainly raises the possibility of atypical pneumonia, especially with Mycobacterium avium but I also wonder about the possibility of chronic aspiration being a major contributor as well.  He has old granuloma on his chest x-ray but his clinical presentation certainly does not suggest tuberculosis.  I think fungal infection is also less likely.  I told him that there is no urgency to start therapy for Mycobacterium avium.  I did discuss a typical 3 drug regimen with him and what it entailed.  I think it is best to wait on final cultures before making a treatment decision.  He is in agreement with that plan  Plan: Follow-up in 3 weeks, sooner if his cultures or other test turn positive  Patient Active Problem List   Diagnosis Date Noted   Atypical pneumonia     Priority: High   Chronic cough 05/29/2018    Priority: High   Bilateral carpal tunnel syndrome 07/12/2020   Hemochromatosis, hereditary ([place]) 03/16/2020   Allergic rhinitis 05/29/2018   Artificial knee joint present 05/29/2018   Asthma without status asthmaticus 05/29/2018   Disorder of iron metabolism 05/29/2018   Encounter for general adult medical examination without abnormal findings 05/29/2018   History of colon polyps 05/29/2018   Inguinal hernia without obstruction or gangrene 05/29/2018   Insomnia due to medical condition 05/29/2018   Malignant neoplasm of prostate ([place]) 05/29/2018   Osteoarthrosis involving more than one site but not generalized 05/29/2018   Other long term (current) drug therapy 05/29/2018   Other specified extrapyramidal and movement disorders 05/29/2018   Pain in right knee 05/29/2018   Pure hypercholesterolemia 05/29/2018   Dextroscoliosis 08/01/2015   Essential  hypertension 06/30/2015   Family history of heart disease 06/30/2015   Left ventricular hypertrophy due to hypertensive disease 06/30/2015   Left ventricular diastolic dysfunction, NYHA class 1 06/30/2015   Fast heart beat 05/11/2015   DJD (degenerative joint disease) of knee 04/27/2015    Patient's Medications  New Prescriptions   No medications on file  Previous Medications   AMPICILLIN (PRINCIPEN) 500 MG CAPSULE    Take 500 mg by mouth daily.   ASPIRIN EC 81 MG TABLET    Take 81 mg by mouth daily.   BENZONATATE (TESSALON) 200 MG CAPSULE    Take 1 capsule (200 mg total) by mouth 3 (three) times daily as needed for cough.   CHOLECALCIFEROL (VITAMIN D3) 125 MCG (5000 UT) TABS    Take 5,000 Units by mouth daily.   CYPROHEPTADINE (PERIACTIN) 4 MG TABLET    Take 4 mg by mouth daily.   FINASTERIDE (PROSCAR) 5 MG TABLET    Take 1 mg by mouth daily. Cut in 4 pieces   MULTIPLE VITAMIN (MULTIVITAMIN WITH MINERALS) TABS TABLET    Take 1 tablet by mouth daily. Reported on 06/28/2015  Modified Medications   No medications on file  Discontinued Medications   No medications on file    HPI: Randall Bradley is a 66 y.o. male who has had a chronic cough productive of white to green sputum for many years.  A CT scan in early 2022 showed small nodular lesions in the right upper lobe.  Repeat CT scan on 12/25/2021 showed progression of extensive nodularity and tree-in-bud nodule formation in the right upper  lobe.  He underwent bronchoscopy on 01/05/2022 which revealed" airway pitting and crypt formation compatible with chronic bronchitis".  Bronchoscopy specimens were sent for Gram stain and routine culture.  No organisms were seen and cultures were negative.  The AFB smear was negative and his AFB culture is negative at 2 weeks.  Fungal cultures remain negative.  Further testing for Aspergillus and Blastomyces is pending.    He quit smoking cigarettes about 40 years ago.  He has no known underlying lung disease.   He has been treated for allergic rhinitis in the past but has no history of asthma.  He denies any current heartburn but has been treated for GERD in the past with PPIs.  He is currently on famotidine.  He reports occasional choking while eating and drinking.  A barium swallow done last August did not show esophageal dysmotility and aspiration with thick liquids.  He lived in [place] from 2014-2016.  A colleague there had some type of severe fungal pneumonia while he was there.  [place]  He is retired from [employer].  He has no known exposure to tuberculosis.  The cough is quite bothersome for him.  He does not note any dyspnea on exertion and he remains quite active.  He has not had any fever, chills or sweats.  His appetite is good and he has not had any recent unintentional weight loss.  Review of Systems: Review of Systems  Constitutional:  Negative for chills, diaphoresis, fever and weight loss.  Respiratory:  Positive for cough and sputum production. Negative for hemoptysis, shortness of breath and wheezing.   Cardiovascular:  Negative for chest pain.  Gastrointestinal:  Negative for heartburn.      Past Medical History:  Diagnosis Date   Alopecia    Arthritis    finger   Bilateral carpal tunnel syndrome 07/12/2020   COVID 03/2021   mild   GERD (gastroesophageal reflux disease)    mild   Hematuria    Hemochromatosis    Hemochromatosis, hereditary ([place]) 03/16/2020   Hereditary hemochromatosis ([place]) monitored by PCP  dr [name] gates   history of phlebotomies -- last one 2014 (pt states body has normalized)   History of bladder stone    History of kidney stones    Hypertension    no medications at this time.   Prostate cancer ([place])    UROLOGIST-- DR Diona Fanti; S/P PROSTATECTOMY 03-17-2008   Right ureteral stone    Rosacea     Social History   Tobacco Use   Smoking status: Former    Packs/day: 0.25    Years: 15.00    Total pack years: 3.75     Types: Cigarettes    Quit date: 10/07/1993    Years since quitting: 28.2   Smokeless tobacco: Never  Vaping Use   Vaping Use: Never used  Substance Use Topics   Alcohol use: Not Currently    Comment: "I quit drinking in ~ 2004"   Drug use: No    No family history on file. Allergies  Allergen Reactions   Ropinirole Other (See Comments)    Other reaction(s): dizziness    OBJECTIVE: Vitals:   01/10/22 1023  BP: 136/76  Pulse: 81  Temp: 97.9 F (36.6 C)  TempSrc: Temporal  SpO2: 97%  Weight: 176 lb (79.8 kg)   Body mass index is 22.6 kg/m.   Physical Exam Cardiovascular:     Rate and Rhythm: Normal rate.  Pulmonary:  Effort: Pulmonary effort is normal.  Psychiatric:        Mood and Affect: Mood normal.   Barium swallow 02/01/2021 IMPRESSION: 1. Aspiration with thick liquids. 2. Esophageal dysmotility.   Chest CT scan 12/25/2021 IMPRESSION: 1. New, irregular, nodular consolidation of the paramedian right upper lobe measuring 1.1 x 1.0 cm. Although most likely infectious or inflammatory, this is nonspecific and follow-up at 3 months is recommended to assess for stability or resolution and exclude malignancy. 2. Multiple new clusters of tiny centrilobular and tree-in-bud nodules, consistent with atypical infection such as atypical mycobacterium. 3. Additional occasional small, discrete, definitively benign pulmonary nodules for which no further follow-up or characterization is specifically required. 4. Calcific stigmata of prior granulomatous infection. 5. Coronary artery disease.  Microbiology: Recent Results (from the past 240 hour(s))  SARS Coronavirus 2 (TAT 6-24 hrs)     Status: None   Collection Time: 01/02/22 12:00 AM  Result Value Ref Range Status   SARS Coronavirus 2 RESULT: NEGATIVE  Final    Comment: RESULT: NEGATIVESARS-CoV-2 INTERPRETATION:A NEGATIVE  test result means that SARS-CoV-2 RNA was not present in the specimen above the limit of  detection of this test. This does not preclude a possible SARS-CoV-2 infection and should not be used as the  sole basis for patient management decisions. Negative results must be combined with clinical observations, patient history, and epidemiological information. Optimum specimen types and timing for peak viral levels during infections caused by SARS-CoV-2  have not been determined. Collection of multiple specimens or types of specimens may be necessary to detect virus. Improper specimen collection and handling, sequence variability under primers/probes, or organism present below the limit of detection may  lead to false negative results. Positive and negative predictive values of testing are highly dependent on prevalence. False negative test results are more likely when prevalence of disease is high.The expected result is NEGATIVE.Fact S heet for  Healthcare Providers: LocalChronicle.no Sheet for Patients: SalonLookup.es Reference Range - Negative   Fungus Culture & Smear     Status: Abnormal (Preliminary result)   Collection Time: 01/02/22  9:53 AM   Specimen: Sputum  Result Value Ref Range Status   MICRO NUMBER: [id number]  Preliminary   SPECIMEN QUALITY: Adequate  Preliminary   Source: SPUTUM  Preliminary   STATUS: PRELIMINARY  Preliminary   SMEAR: No fungal elements seen.  Preliminary   ISOLATE 1: Candida albicans (A)  Preliminary    Comment: Scant growth of Candida albicans  Respiratory or Resp and Sputum Culture     Status: Abnormal   Collection Time: 01/02/22  9:53 AM   Specimen: Sputum  Result Value Ref Range Status   MICRO NUMBER: [id number]  Final   SPECIMEN QUALITY: Adequate  Final   Source SPUTUM  Final   STATUS: FINAL  Final   GRAM STAIN: (A)  Final    Gram stain indicates that the specimen is representative of the lower respiratory tract. No white blood cells seen Few Gram positive cocci   RESULT: Growth of normal  oropharyngeal flora.  Final  Culture, BAL-quantitative w Gram Stain     Status: None   Collection Time: 01/05/22  9:21 AM   Specimen: Bronchial Washing, Right; Lung  Result Value Ref Range Status   Specimen Description BRONCHIAL WASHINGS RIGHT  Final   Special Requests NONE  Final   Gram Stain NO WBC SEEN NO ORGANISMS SEEN   Final   Culture   Final    NO GROWTH 2 DAYS Performed  at [facility], [place] [address], [place], [place] [address]    Report Status 01/07/2022 FINAL  Final  Culture, fungus without smear     Status: None (Preliminary result)   Collection Time: 01/05/22  9:21 AM   Specimen: Bronchial Washing, Right; Lung  Result Value Ref Range Status   Specimen Description BRONCHIAL WASHINGS RIGHT BRACHIAL  Final   Special Requests NONE  Final   Culture   Final    NO FUNGUS ISOLATED AFTER 2 DAYS Performed at [facility] [address] [place], [place] [address]    Report Status PENDING  Incomplete  Aerobic/Anaerobic Culture w Gram Stain (surgical/deep wound)     Status: None (Preliminary result)   Collection Time: 01/05/22  9:21 AM   Specimen: Bronchial Washing, Right; Lung  Result Value Ref Range Status   Specimen Description BRONCHIAL WASHINGS  Final   Special Requests RIGHT  Final   Gram Stain NO WBC SEEN NO ORGANISMS SEEN   Final   Culture   Final    NO GROWTH 4 DAYS NO ANAEROBES ISOLATED; CULTURE IN PROGRESS FOR 5 DAYS Performed at [facility], [place] [address] [place], [place] [address]    Report Status PENDING  Incomplete  Acid Fast Smear (AFB)     Status: None   Collection Time: 01/05/22  9:21 AM   Specimen: Bronchial Washing, Right; Lung  Result Value Ref Range Status   AFB Specimen Processing Concentration  Final   Acid Fast Smear Negative  Final    Comment: (NOTE) Performed At: [facility] [place], [place] [id number] Rush Farmer MD AC:[id number]    Source (AFB) BRONCHIAL WASHINGS  Final    Comment:  RIGHT Performed at [facility], [place] [address], [place], [place] [address]     Michel Bickers, [place] for Infectious [facility] [phone] pager   [phone] cell 01/10/2022, 10:55 AM

## 2022-01-14 LAB — HISTOPLASMA GAL'MANNAN AG SER: Histoplasma Gal'mannan Ag Ser: <0.5 (ref <0.5)

## 2022-01-14 LAB — HISTOPLASMA ANTIGEN, URINE: Histoplasma Antigen, urine: <0.2 ng/mL

## 2022-01-14 LAB — BLASTOMYCES ANTIGEN: Blastomyces Antigen: NOT DETECTED ng/mL

## 2022-01-14 LAB — MVISTA BLASTOMYCES QNT AG, URINE
Interpretation: NEGATIVE
Result (ng/ml): NOT DETECTED ng/mL

## 2022-01-14 LAB — BLASTOMYCES AB, ID: Blastomyces Abs, Qn, DID: NEGATIVE

## 2022-01-14 LAB — HISTOPLASMA ANTIBODIES
H Band: NEGATIVE
M Band: NEGATIVE

## 2022-01-17 LAB — ASPERGILLUS ANTIGEN, BAL/SERUM: Aspergillus Ag, BAL/Serum: 0.13 Index (ref 0.00–0.49)

## 2022-01-25 LAB — BLASTOMYCES ANTIGEN: Blastomyces Antigen: NOT DETECTED ng/mL

## 2022-01-26 LAB — CULTURE, FUNGUS WITHOUT SMEAR

## 2022-01-30 LAB — FUNGUS CULTURE W SMEAR
MICRO NUMBER:: 13631004
SMEAR:: NONE SEEN
SPECIMEN QUALITY:: ADEQUATE

## 2022-01-30 LAB — RESPIRATORY CULTURE OR RESPIRATORY AND SPUTUM CULTURE
MICRO NUMBER:: 13631005
RESULT:: NORMAL
SPECIMEN QUALITY:: ADEQUATE

## 2022-01-30 LAB — MISC LABCORP TEST (SEND OUT): Labcorp test code: 9985

## 2022-01-31 ENCOUNTER — Other Ambulatory Visit: Payer: Self-pay

## 2022-01-31 ENCOUNTER — Ambulatory Visit (INDEPENDENT_AMBULATORY_CARE_PROVIDER_SITE_OTHER): Payer: BLUE CROSS/BLUE SHIELD | Admitting: Internal Medicine

## 2022-01-31 ENCOUNTER — Encounter: Payer: Self-pay | Admitting: Internal Medicine

## 2022-01-31 DIAGNOSIS — J189 Pneumonia, unspecified organism: Secondary | ICD-10-CM | POA: Diagnosis not present

## 2022-01-31 DIAGNOSIS — A048 Other specified bacterial intestinal infections: Secondary | ICD-10-CM | POA: Diagnosis not present

## 2022-01-31 DIAGNOSIS — K224 Dyskinesia of esophagus: Secondary | ICD-10-CM | POA: Insufficient documentation

## 2022-01-31 NOTE — Progress Notes (Signed)
[facility] for Infectious Disease  Patient Active Problem List   Diagnosis Date Noted   Atypical pneumonia     Priority: High   Chronic cough 05/29/2018    Priority: High   Helicobacter pylori (H. pylori) infection 01/31/2022   Esophageal dysmotility 01/31/2022   H/O radical prostatectomy 01/10/2022   Bilateral carpal tunnel syndrome 07/12/2020   Hemochromatosis, hereditary ([place]) 03/16/2020   Allergic rhinitis 05/29/2018   Artificial knee joint present 05/29/2018   Asthma without status asthmaticus 05/29/2018   Disorder of iron metabolism 05/29/2018   Encounter for general adult medical examination without abnormal findings 05/29/2018   History of colon polyps 05/29/2018   Inguinal hernia without obstruction or gangrene 05/29/2018   Insomnia due to medical condition 05/29/2018   Malignant neoplasm of prostate ([place]) 05/29/2018   Osteoarthrosis involving more than one site but not generalized 05/29/2018   Other long term (current) drug therapy 05/29/2018   Other specified extrapyramidal and movement disorders 05/29/2018   Pain in right knee 05/29/2018   Pure hypercholesterolemia 05/29/2018   Dextroscoliosis 08/01/2015   Essential hypertension 06/30/2015   Family history of heart disease 06/30/2015   Left ventricular hypertrophy due to hypertensive disease 06/30/2015   Left ventricular diastolic dysfunction, NYHA class 1 06/30/2015   Fast heart beat 05/11/2015   DJD (degenerative joint disease) of knee 04/27/2015    Patient's Medications  New Prescriptions   No medications on file  Previous Medications   AMPICILLIN (PRINCIPEN) 500 MG CAPSULE    Take 500 mg by mouth daily.   ASPIRIN EC 81 MG TABLET    Take 81 mg by mouth daily.   BENZONATATE (TESSALON) 200 MG CAPSULE    Take 1 capsule (200 mg total) by mouth 3 (three) times daily as needed for cough.   CHOLECALCIFEROL (VITAMIN D3) 125 MCG (5000 UT) TABS    Take 5,000 Units by mouth daily.   CYPROHEPTADINE  (PERIACTIN) 4 MG TABLET    Take 4 mg by mouth daily.   FINASTERIDE (PROSCAR) 5 MG TABLET    Take 1 mg by mouth daily. Cut in 4 pieces   MULTIPLE VITAMIN (MULTIVITAMIN WITH MINERALS) TABS TABLET    Take 1 tablet by mouth daily. Reported on 06/28/2015  Modified Medications   No medications on file  Discontinued Medications   No medications on file    Subjective: Randall Bradley is in for his routine follow-up visit.  He has had a chronic cough productive of white to green sputum for many years.  A CT scan in early 2022 showed small nodular lesions in the right upper lobe.  Repeat CT scan on 12/25/2021 showed progression of extensive nodularity and tree-in-bud nodule formation in the right upper lobe.  He underwent bronchoscopy on 01/05/2022 which revealed "airway pitting and crypt formation compatible with chronic bronchitis". Bronchoscopy specimens were sent for Gram stain and routine culture.  No organisms were seen and cultures were negative.  The AFB smear was negative and his AFB culture is negative so far.  Fungal cultures were negative.  Further testing for Aspergillus, histoplasma and Blastomyces were negative.  He is known to have esophageal dysmotility with aspiration.  He sometimes has difficulty swallowing very dry food.  He underwent EGD by [facility] last year and recalls being told that he had 2 small areas in his stomach and he Helicobacter infection.  He recalls being treated with antibiotics.  He does not recall having any follow-up testing.  Review of  Systems: Review of Systems  Constitutional:  Negative for chills, diaphoresis and fever.  Respiratory:  Positive for cough and sputum production. Negative for hemoptysis and shortness of breath.   Gastrointestinal:  Positive for heartburn.    Past Medical History:  Diagnosis Date   Alopecia    Arthritis    finger   Bilateral carpal tunnel syndrome 07/12/2020   COVID 03/2021   mild   GERD (gastroesophageal reflux disease)    mild    Hematuria    Hemochromatosis    Hemochromatosis, hereditary ([place]) 03/16/2020   Hereditary hemochromatosis ([place]) monitored by PCP  dr [name] gates   history of phlebotomies -- last one 2014 (pt states body has normalized)   History of bladder stone    History of kidney stones    Hypertension    no medications at this time.   Prostate cancer ([place])    UROLOGIST-- DR Diona Fanti; S/P PROSTATECTOMY 03-17-2008   Right ureteral stone    Rosacea     Social History   Tobacco Use   Smoking status: Former    Packs/day: 0.25    Years: 15.00    Total pack years: 3.75    Types: Cigarettes    Quit date: 10/07/1993    Years since quitting: 28.3   Smokeless tobacco: Never  Vaping Use   Vaping Use: Never used  Substance Use Topics   Alcohol use: Not Currently    Comment: "I quit drinking in ~ 2004"   Drug use: No    No family history on file.  Allergies  Allergen Reactions   Ropinirole Other (See Comments)    Other reaction(s): dizziness    Objective: Vitals:   01/31/22 0956  BP: 125/89  Pulse: 88  Resp: 16  SpO2: 99%  Weight: 176 lb (79.8 kg)  Height: '6\' 2"'$  (1.88 m)   Body mass index is 22.6 kg/m.  Physical Exam Constitutional:      Comments: He is very calm and pleasant.  Cardiovascular:     Rate and Rhythm: Normal rate.  Pulmonary:     Effort: Pulmonary effort is normal.  Psychiatric:        Mood and Affect: Mood normal.     Lab Results    Problem List Items Addressed This Visit       High   Atypical pneumonia    The changes on his recent CT scans and suggests a strong possibility of mycobacterial infection but so far AFB smear and cultures are negative.  I will have him collect more sputum samples for repeat AFB smear and culture and see him back in 3 weeks.      Relevant Orders   MYCOBACTERIA, CULTURE, WITH FLUOROCHROME SMEAR     Unprioritized   Helicobacter pylori (H. pylori) infection    Will have him sign a release of information to get records  from [facility].  I will check a Helicobacter breath test to see if his infection was eradicated.      Relevant Orders   H. pylori breath test   Esophageal dysmotility    He has a history of esophageal dysmotility with aspiration.  I still have some concerns that his chronic productive cough and CT changes could be due to chronic aspiration.        Michel Bickers, MD [facility] [phone] pager   [phone] cell 01/31/2022, 10:26 AM

## 2022-01-31 NOTE — Assessment & Plan Note (Signed)
He has a history of esophageal dysmotility with aspiration.  I still have some concerns that his chronic productive cough and CT changes could be due to chronic aspiration.

## 2022-01-31 NOTE — Assessment & Plan Note (Signed)
Will have him sign a release of information to get records from [facility].  I will check a Helicobacter breath test to see if his infection was eradicated.

## 2022-01-31 NOTE — Assessment & Plan Note (Signed)
The changes on his recent CT scans and suggests a strong possibility of mycobacterial infection but so far AFB smear and cultures are negative.  I will have him collect more sputum samples for repeat AFB smear and culture and see him back in 3 weeks.

## 2022-02-01 ENCOUNTER — Other Ambulatory Visit: Payer: BLUE CROSS/BLUE SHIELD

## 2022-02-01 ENCOUNTER — Other Ambulatory Visit: Payer: Self-pay

## 2022-02-01 DIAGNOSIS — A048 Other specified bacterial intestinal infections: Secondary | ICD-10-CM

## 2022-02-01 DIAGNOSIS — J189 Pneumonia, unspecified organism: Secondary | ICD-10-CM

## 2022-02-01 NOTE — Addendum Note (Signed)
Addended by: Caffie Pinto on: 02/01/2022 08:22 AM   Modules accepted: Orders

## 2022-02-02 ENCOUNTER — Other Ambulatory Visit: Payer: Self-pay | Admitting: Student

## 2022-02-07 LAB — H. PYLORI BREATH TEST: H. pylori Breath Test: NOT DETECTED

## 2022-02-18 LAB — ACID FAST CULTURE WITH REFLEXED SENSITIVITIES (MYCOBACTERIA): Acid Fast Culture: NEGATIVE

## 2022-02-20 LAB — AFB CULTURE WITH SMEAR (NOT AT ARMC)
Acid Fast Culture: NEGATIVE
Acid Fast Smear: NEGATIVE

## 2022-02-22 ENCOUNTER — Encounter: Payer: Self-pay | Admitting: Internal Medicine

## 2022-02-22 ENCOUNTER — Ambulatory Visit (INDEPENDENT_AMBULATORY_CARE_PROVIDER_SITE_OTHER): Payer: BLUE CROSS/BLUE SHIELD | Admitting: Internal Medicine

## 2022-02-22 ENCOUNTER — Other Ambulatory Visit: Payer: Self-pay

## 2022-02-22 DIAGNOSIS — A310 Pulmonary mycobacterial infection: Secondary | ICD-10-CM | POA: Diagnosis not present

## 2022-02-22 MED ORDER — ETHAMBUTOL HCL 400 MG PO TABS: 2000.0000 mg | ORAL_TABLET | ORAL | 11 refills | Status: DC

## 2022-02-22 MED ORDER — RIFAMPIN 300 MG PO CAPS: 600.0000 mg | ORAL_CAPSULE | ORAL | 11 refills | Status: DC

## 2022-02-22 MED ORDER — AZITHROMYCIN 500 MG PO TABS: 500.0000 mg | ORAL_TABLET | ORAL | 11 refills | Status: DC

## 2022-02-22 NOTE — Progress Notes (Signed)
[facility]  Patient Active Problem List   Diagnosis Date Noted   Mycobacterium avium infection ([place])     Priority: High   Chronic cough 05/29/2018    Priority: High   Helicobacter pylori (H. pylori) infection 01/31/2022   Esophageal dysmotility 01/31/2022   H/O radical prostatectomy 01/10/2022   Bilateral carpal tunnel syndrome 07/12/2020   Hemochromatosis, hereditary ([place]) 03/16/2020   Allergic rhinitis 05/29/2018   Artificial knee joint present 05/29/2018   Asthma without status asthmaticus 05/29/2018   Disorder of iron metabolism 05/29/2018   Encounter for general adult medical examination without abnormal findings 05/29/2018   History of colon polyps 05/29/2018   Inguinal hernia without obstruction or gangrene 05/29/2018   Insomnia due to medical condition 05/29/2018   Malignant neoplasm of prostate ([place]) 05/29/2018   Osteoarthrosis involving more than one site but not generalized 05/29/2018   Other long term (current) drug therapy 05/29/2018   Other specified extrapyramidal and movement disorders 05/29/2018   Pain in right knee 05/29/2018   Pure hypercholesterolemia 05/29/2018   Dextroscoliosis 08/01/2015   Essential hypertension 06/30/2015   Family history of heart disease 06/30/2015   Left ventricular hypertrophy due to hypertensive disease 06/30/2015   Left ventricular diastolic dysfunction, NYHA class 1 06/30/2015   Fast heart beat 05/11/2015   DJD (degenerative joint disease) of knee 04/27/2015    Patient's Medications  New Prescriptions   AZITHROMYCIN (ZITHROMAX) 500 MG TABLET    Take 1 tablet (500 mg total) by mouth 3 (three) times a week.   ETHAMBUTOL (MYAMBUTOL) 400 MG TABLET    Take 5 tablets (2,000 mg total) by mouth 3 (three) times a week.   RIFAMPIN (RIFADIN) 300 MG CAPSULE    Take 2 capsules (600 mg total) by mouth 3 (three) times a week.  Previous Medications   AMPICILLIN (PRINCIPEN) 500 MG CAPSULE    Take 500 mg  by mouth daily.   ASPIRIN EC 81 MG TABLET    Take 81 mg by mouth daily.   BENZONATATE (TESSALON) 200 MG CAPSULE    TAKE 1 CAPSULE (200 MG TOTAL) BY MOUTH 3 (THREE) TIMES DAILY AS NEEDED FOR COUGH.   CHOLECALCIFEROL (VITAMIN D3) 125 MCG (5000 UT) TABS    Take 5,000 Units by mouth daily.   CYPROHEPTADINE (PERIACTIN) 4 MG TABLET    Take 4 mg by mouth daily.   FINASTERIDE (PROSCAR) 5 MG TABLET    Take 1 mg by mouth daily. Cut in 4 pieces   MULTIPLE VITAMIN (MULTIVITAMIN WITH MINERALS) TABS TABLET    Take 1 tablet by mouth daily. Reported on 06/28/2015  Modified Medications   No medications on file  Discontinued Medications   No medications on file    Subjective: Randall Bradley is in for his routine follow-up visit.  He tells me that his chronic cough is getting worse and more productive.  A chest CT scan 2 months ago revealed:  IMPRESSION: 1. New, irregular, nodular consolidation of the paramedian right upper lobe measuring 1.1 x 1.0 cm. Although most likely infectious or inflammatory, this is nonspecific and follow-up at 3 months is recommended to assess for stability or resolution and exclude malignancy. 2. Multiple new clusters of tiny centrilobular and tree-in-bud nodules, consistent with atypical infection such as atypical mycobacterium. 3. Additional occasional small, discrete, definitively benign pulmonary nodules for which no further follow-up or characterization is specifically required. 4. Calcific stigmata of prior granulomatous infection. 5. Coronary artery disease.  He  underwent bronchoscopy on 01/05/2022.  His AFB smear was negative and AFB cultures were finalized as negative.  Fungal studies were negative at that time as well.  I obtained a sputum for repeat AFB testing on 02/01/2022.  The smear was negative but yesterday afternoon the culture was reported to be growing acid-fast bacillus.  He has a history of prior treatment for Helicobacter pylori.  He is H. pylori breath test was  negative on 02/01/2022.  Review of Systems: Review of Systems  Constitutional:  Negative for chills, diaphoresis, fever and weight loss.  Respiratory:  Positive for cough, sputum production and shortness of breath. Negative for hemoptysis.   Cardiovascular:  Negative for chest pain.    Past Medical History:  Diagnosis Date   Alopecia    Arthritis    finger   Bilateral carpal tunnel syndrome 07/12/2020   COVID 03/2021   mild   GERD (gastroesophageal reflux disease)    mild   Hematuria    Hemochromatosis    Hemochromatosis, hereditary ([place]) 03/16/2020   Hereditary hemochromatosis ([place]) monitored by PCP  dr [name] gates   history of phlebotomies -- last one 2014 (pt states body has normalized)   History of bladder stone    History of kidney stones    Hypertension    no medications at this time.   Prostate cancer ([place])    UROLOGIST-- DR Diona Fanti; S/P PROSTATECTOMY 03-17-2008   Right ureteral stone    Rosacea     Social History   Tobacco Use   Smoking status: Former    Packs/day: 0.25    Years: 15.00    Total pack years: 3.75    Types: Cigarettes    Quit date: 10/07/1993    Years since quitting: 28.3   Smokeless tobacco: Never  Vaping Use   Vaping Use: Never used  Substance Use Topics   Alcohol use: Not Currently    Comment: "I quit drinking in ~ 2004"   Drug use: No    No family history on file.  Allergies  Allergen Reactions   Ropinirole Other (See Comments)    Other reaction(s): dizziness    Objective: Vitals:   02/22/22 1002  BP: 115/77  Pulse: 80  Resp: 16  SpO2: 95%  Weight: 195 lb (88.5 kg)  Height: '6\' 2"'$  (1.88 m)   Body mass index is 25.04 kg/m.  Physical Exam Constitutional:      Comments: His spirits are good.  Cardiovascular:     Rate and Rhythm: Normal rate and regular rhythm.     Heart sounds: No murmur heard. Pulmonary:     Effort: Pulmonary effort is normal.     Breath sounds: Normal breath sounds. No wheezing, rhonchi or  rales.  Psychiatric:        Mood and Affect: Mood normal.     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  I talked to him at length about treatment options and he is in agreement with starting azithromycin, rifampin and ethambutol 3 times weekly.  I have reviewed potential side effects with him.  He will follow-up here in 4 weeks.      Relevant Medications   azithromycin (ZITHROMAX) 500 MG tablet (Start on 02/23/2022)   rifampin (RIFADIN) 300 MG capsule (Start on 02/23/2022)   ethambutol (MYAMBUTOL)  400 MG tablet (Start on 02/23/2022)     Michel Bickers, MD [facility] [phone] pager   [phone] cell 02/22/2022, 11:56 AM

## 2022-02-22 NOTE — Assessment & Plan Note (Signed)
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  I talked to him at length about treatment options and he is in agreement with starting azithromycin, rifampin and ethambutol 3 times weekly.  I have reviewed potential side effects with him.  He will follow-up here in 4 weeks.

## 2022-02-27 NOTE — Progress Notes (Unsigned)
hi  Synopsis: Referred for persistent cough by [name], MD  Subjective:   PATIENT ID: Randall Bradley GENDER: male DOB: 02-22-1956, MRN: [id number]  No chief complaint on file.   [name] with history of remote smoking <3-4 pack years quit 1995 social smoking, hereditary hemochromatosis undergoing therapeutic phlebotomy, ACDF 4-7 surgery 03/2020, HTN, remote smoking who is referred for cough failing to respond to [facility].  He says it's been an issue for about a year and a half. 10 years ago had a cough and was told by allergist that it was possibly due to GERD. Tried H2B and initially it was helpful. He did Barium swallow last week. Has been seeing GI recently in part for cough and this was ordered as part of workup. Getting another speech evaluation tomorrow - does endorse some trouble swallowing/aspiration. May get EGD when he goes for colonoscopy. Sees [name] at [facility]. Only functional issue he has with cough is that it wakes him up at night. 2-3 times per day coughs up a little sputum. Has been on and off Breo for years. He was rinsing mouth out when he was using.   He does have some symptomatic reflux with bitter/metallic taste when he breathes in. Currently taking only pepcid at night before bedtime. Sounds like ppi is on hold till EGD.   Never has had PFTs, asthma as a kid.   He does have some post-nasal drainage. He uses no nasal spray.   He has no accompanying dyspnea.    Retired, worked for a Journalist, newspaper, worked as a Physicist, medical up until about 2 years ago. His symptoms were not worse at all during that. No symptom worsening with hay exposure either. [place] - had a friend who had life-threatening endemic fungal infection there.   No family history of lung disease.   Interval HPI:  Surprisingly AFB culture from sputum 8/10 positive, smear negative. Started on RAE TIW by Dr. Megan Salon at  visit 8/31.  Otherwise pertinent review of systems is negative.  Past Medical History:  Diagnosis Date   Alopecia    Arthritis    finger   Bilateral carpal tunnel syndrome 07/12/2020   COVID 03/2021   mild   GERD (gastroesophageal reflux disease)    mild   Hematuria    Hemochromatosis    Hemochromatosis, hereditary ([place]) 03/16/2020   Hereditary hemochromatosis ([place]) monitored by PCP  dr [name] gates   history of phlebotomies -- last one 2014 (pt states body has normalized)   History of bladder stone    History of kidney stones    Hypertension    no medications at this time.   Prostate cancer ([place])    UROLOGIST-- DR Diona Fanti; S/P PROSTATECTOMY 03-17-2008   Right ureteral stone    Rosacea      No family history on file.   Past Surgical History:  Procedure Laterality Date   APPENDECTOMY  1963   BRONCHIAL WASHINGS  01/05/2022   Procedure: BRONCHIAL WASHINGS;  Surgeon: Maryjane Hurter, MD;  Location: [facility];  Service: Pulmonary;;   COLONOSCOPY     COMPLEX WOUND CLOSURE Left 03-26-2009   INDEX FINGER COMPLEX WOUND LACERATION REPAIR   CYSTOLITHOLAPAXY AND URETHRAL DILATION  10-05-2009   CYSTOSCOPY W/ RETROGRADES Right 10/11/2014   Procedure: CYSTOSCOPY WITH RETROGRADE PYELOGRAM;  Surgeon: Franchot Gallo, MD;  [facility];  Service: Urology;  Laterality: Right;   HOLMIUM LASER APPLICATION N/A  10/11/2014   Procedure: HOLMIUM LASER LITHOTRIPSY, ;  Surgeon: Franchot Gallo, MD;  Location: [facility];  Service: Urology;  Laterality: N/A;   JOINT REPLACEMENT     KNEE ARTHROSCOPY Left 1995   LIPOMA EXCISION  03-04-2000   forehead   ROBOT ASSISTED LAPAROSCOPIC RADICAL PROSTATECTOMY  03-17-2008   w/  BILATERAL PELVIC LYMPHADENECTOMY (NON-NERVE SPARING)   TOTAL KNEE ARTHROPLASTY Left 04/27/2015   Procedure: TOTAL KNEE ARTHROPLASTY;  Surgeon: [name], MD;  Location: [facility];  Service: Orthopedics;  Laterality: Left;   TOTAL  KNEE ARTHROPLASTY Right 06/08/2015   TOTAL KNEE ARTHROPLASTY Right 06/08/2015   Procedure: TOTAL RIGHT KNEE ARTHROPLASTY;  Surgeon: [name], MD;  Location: [facility];  Service: Orthopedics;  Laterality: Right;   VIDEO BRONCHOSCOPY Right 01/05/2022   Procedure: VIDEO BRONCHOSCOPY WITHOUT FLUORO;  Surgeon: Maryjane Hurter, MD;  Location: [facility] ENDOSCOPY;  Service: Pulmonary;  Laterality: Right;    Social History   Socioeconomic History   Marital status: Single    Spouse name: Not on file   Number of children: Not on file   Years of education: Not on file   Highest education level: Not on file  Occupational History   Not on file  Tobacco Use   Smoking status: Former    Packs/day: 0.25    Years: 15.00    Total pack years: 3.75    Types: Cigarettes    Quit date: 10/07/1993    Years since quitting: 28.4   Smokeless tobacco: Never  Vaping Use   Vaping Use: Never used  Substance and Sexual Activity   Alcohol use: Not Currently    Comment: "I quit drinking in ~ 2004"   Drug use: No   Sexual activity: Never  Other Topics Concern   Not on file  Social History Narrative   Not on file   Social Determinants of Health   Financial Resource Strain: Not on file  Food Insecurity: Not on file  Transportation Needs: Not on file  Physical Activity: Not on file  Stress: Not on file  Social Connections: Not on file  Intimate Partner Violence: Not on file     Allergies  Allergen Reactions   Ropinirole Other (See Comments)    Other reaction(s): dizziness      Outpatient Medications Prior to Visit  Medication Sig Dispense Refill   ampicillin (PRINCIPEN) 500 MG capsule Take 500 mg by mouth daily.     aspirin EC 81 MG tablet Take 81 mg by mouth daily.     azithromycin (ZITHROMAX) 500 MG tablet Take 1 tablet (500 mg total) by mouth 3 (three) times a week. 12 tablet 11   benzonatate (TESSALON) 200 MG capsule TAKE 1 CAPSULE (200 MG TOTAL) BY MOUTH 3 (THREE) TIMES DAILY AS NEEDED FOR  COUGH. 30 capsule 1   Cholecalciferol (VITAMIN D3) 125 MCG (5000 UT) TABS Take 5,000 Units by mouth daily.     cyproheptadine (PERIACTIN) 4 MG tablet Take 4 mg by mouth daily.     ethambutol (MYAMBUTOL) 400 MG tablet Take 5 tablets (2,000 mg total) by mouth 3 (three) times a week. 60 tablet 11   finasteride (PROSCAR) 5 MG tablet Take 1 mg by mouth daily. Cut in 4 pieces     Multiple Vitamin (MULTIVITAMIN WITH MINERALS) TABS tablet Take 1 tablet by mouth daily. Reported on 06/28/2015     rifampin (RIFADIN) 300 MG capsule Take 2 capsules (600 mg total) by mouth 3 (three) times a week.  24 capsule 11   No facility-administered medications prior to visit.       Objective:   Physical Exam:  General appearance: 66 y.o., male, NAD, conversant  Eyes: anicteric sclerae; PERRL, tracking appropriately HENT: NCAT; MMM, frequent throat clearing during exam Neck: Trachea midline; no lymphadenopathy, no JVD Lungs: no wheeze, with normal respiratory effort CV: RRR Abdomen: non-distended  Extremities: No peripheral edema, warm Skin: Normal turgor and texture; no rash Psych: Appropriate affect Neuro: Alert and oriented to person and place, no focal deficit     There were no vitals filed for this visit.        on RA BMI Readings from Last 3 Encounters:  02/22/22 25.04 kg/m  01/31/22 22.60 kg/m  01/10/22 22.60 kg/m   Wt Readings from Last 3 Encounters:  02/22/22 195 lb (88.5 kg)  01/31/22 176 lb (79.8 kg)  01/10/22 176 lb (79.8 kg)     CBC    Component Value Date/Time   WBC 4.2 01/05/2022 0718   RBC 5.10 01/05/2022 0718   HGB 15.1 01/05/2022 0718   HGB 14.4 09/13/2021 1512   HCT 44.5 01/05/2022 0718   PLT 191 01/05/2022 0718   PLT 198 09/13/2021 1512   MCV 87.3 01/05/2022 0718   MCH 29.6 01/05/2022 0718   MCHC 33.9 01/05/2022 0718   RDW 13.0 01/05/2022 0718   LYMPHSABS 2.0 09/13/2021 1512   MONOABS 0.6 09/13/2021 1512   EOSABS 0.3 09/13/2021 1512   BASOSABS 0.1  09/13/2021 1512      Chest Imaging: CT Chest 3/30 reviewed by me and remarkable for a couple small nodules and a lot of subtle micronodularity/TIB overall pretty similar in appearance (apart from new 3-55m nodules) to 2006 study  Esophagram 02/01/21: 1. Aspiration with thick liquids. 2. Esophageal dysmotility.  CXR 04/2021 reviewed by me normal  CT Chest 12/26/21 reviewed by me with RUL TIB bronchiolitis, 1cm nodular consolidation anterior RUL  Pulmonary Functions Testing Results:    Latest Ref Rng & Units 03/10/2021   12:01 PM  PFT Results  FVC-Pre L 4.76   FVC-Predicted Pre % 90   FVC-Post L 4.91   FVC-Predicted Post % 93   Pre FEV1/FVC % % 73   Post FEV1/FCV % % 76   FEV1-Pre L 3.47   FEV1-Predicted Pre % 89   FEV1-Post L 3.71   DLCO uncorrected ml/min/mmHg 28.10   DLCO UNC% % 94   DLCO corrected ml/min/mmHg 28.10   DLCO COR %Predicted % 94   DLVA Predicted % 93   TLC L 8.12   TLC % Predicted % 103   RV % Predicted % 117    Normal PFT, normal FV loops   Echocardiogram:  TTE 2016 with G1DD     Assessment & Plan:   # Bronchiolitis: # Pulmonary nodules: May have viral URI superimposed on background of chronic granulomatous process (calcified mediastinal LAD and calcifications in spleen) or alternatively all could be result of chronic granulomatous process (endemic fungal infection, NTM, less likely something like sarcoid). He does worry about potential exposures from [place]  # Chronic cough: I still feel LPR with/without recurrent aspiration is most likely issue and he's been off of ppi for some time now - frequent throat clearing in clinic, prior ACDF which may increase his risk for this and fits timing for symptomatic worsening, esophagram with 01/2021 with aspiration/esophageal dysmotility, EGD with underlying ulcer disease. Haven't completely ruled out asthma/NAEB. Has had intermittent issues with  PND as well. CT since last visit however  again shows foci of bronchiolitis and he worries about prior exposures overseas as above.  # DOE: Possible component of asthma but overall sounds like his activity limitation is relatively minor.    Plan: - we will work on getting you scheduled for bronch. Nothing to eat or drink after midnight the night before the procedure and you will need a ride home - I've ordered a bunch of tests to look for infection and I've sent referral to infectious disease. You will be called to schedule this. - flonase/neti - has declined starting singulair before allergy testing  - we may be circling back to ppi at some point for LPR. Not sure if GI ever did work up a potential esophageal dysmotility issue or if just underwent EGD - tessalon perles/dextromethorphan prn   Return to care 8 weeks  Maryjane Hurter, MD [facility] 02/27/2022 12:46 PM

## 2022-02-28 ENCOUNTER — Ambulatory Visit (INDEPENDENT_AMBULATORY_CARE_PROVIDER_SITE_OTHER): Payer: BLUE CROSS/BLUE SHIELD | Admitting: Student

## 2022-02-28 ENCOUNTER — Encounter: Payer: Self-pay | Admitting: Student

## 2022-02-28 VITALS — BP 108/64 | HR 89 | Temp 97.8°F | Ht 74.0 in | Wt 193.8 lb

## 2022-02-28 DIAGNOSIS — A319 Mycobacterial infection, unspecified: Secondary | ICD-10-CM | POA: Diagnosis not present

## 2022-02-28 DIAGNOSIS — R053 Chronic cough: Secondary | ICD-10-CM | POA: Diagnosis not present

## 2022-02-28 DIAGNOSIS — R918 Other nonspecific abnormal finding of lung field: Secondary | ICD-10-CM | POA: Diagnosis not present

## 2022-02-28 NOTE — Patient Instructions (Signed)
-   We'll see how things go with treatment of NTM - We will repeat CT Chest to follow a pulmonary nodule in late October - We'll see you in clinic afterward to review

## 2022-03-15 ENCOUNTER — Other Ambulatory Visit: Payer: BLUE CROSS/BLUE SHIELD

## 2022-03-15 ENCOUNTER — Ambulatory Visit: Payer: BLUE CROSS/BLUE SHIELD | Admitting: Hematology & Oncology

## 2022-03-20 LAB — MYCOBACTERIA,CULT W/FLUOROCHROME SMEAR
MICRO NUMBER:: 13765728
SMEAR:: NONE SEEN
SPECIMEN QUALITY:: ADEQUATE

## 2022-03-22 ENCOUNTER — Other Ambulatory Visit: Payer: Self-pay

## 2022-03-22 ENCOUNTER — Ambulatory Visit (INDEPENDENT_AMBULATORY_CARE_PROVIDER_SITE_OTHER): Payer: BLUE CROSS/BLUE SHIELD | Admitting: Internal Medicine

## 2022-03-22 DIAGNOSIS — A310 Pulmonary mycobacterial infection: Secondary | ICD-10-CM | POA: Diagnosis not present

## 2022-03-22 NOTE — Assessment & Plan Note (Signed)
It is too early to expect a dramatic improvement and has chronic, smoldering Mycobacterium avium pneumonia.  He will continue his current 3 drug antibiotic regimen.  I will check lab work today and a repeat sputum AFB smear and culture.  He will follow-up in 2 months.

## 2022-03-22 NOTE — Progress Notes (Signed)
[facility] for [facility]ious [facility]  Patient Active Problem List   Diagnosis Date Noted   Mycobacterium avium infection ([place])     Priority: High   Chronic cough 05/29/2018    Priority: High   Helicobacter pylori (H. pylori) infection 01/31/2022   Esophageal dysmotility 01/31/2022   H/O radical prostatectomy 01/10/2022   Bilateral carpal tunnel syndrome 07/12/2020   Hemochromatosis, hereditary ([place]) 03/16/2020   Allergic rhinitis 05/29/2018   Artificial knee joint present 05/29/2018   Asthma without status asthmaticus 05/29/2018   Disorder of iron metabolism 05/29/2018   Encounter for general adult medical examination without abnormal findings 05/29/2018   History of colon polyps 05/29/2018   Inguinal hernia without obstruction or gangrene 05/29/2018   Insomnia due to medical condition 05/29/2018   Malignant neoplasm of prostate ([place]) 05/29/2018   Osteoarthrosis involving more than one site but not generalized 05/29/2018   Other long term (current) drug therapy 05/29/2018   Other specified extrapyramidal and movement disorders 05/29/2018   Pain in right knee 05/29/2018   Pure hypercholesterolemia 05/29/2018   Dextroscoliosis 08/01/2015   Essential hypertension 06/30/2015   Family history of heart disease 06/30/2015   Left ventricular hypertrophy due to hypertensive disease 06/30/2015   Left ventricular diastolic dysfunction, NYHA class 1 06/30/2015   Fast heart beat 05/11/2015   DJD (degenerative joint disease) of knee 04/27/2015    Patient's Medications  New Prescriptions   No medications on file  Previous Medications   AMPICILLIN (PRINCIPEN) 500 MG CAPSULE    Take 500 mg by mouth daily.   ASPIRIN EC 81 MG TABLET    Take 81 mg by mouth daily.   AZITHROMYCIN (ZITHROMAX) 500 MG TABLET    Take 1 tablet (500 mg total) by mouth 3 (three) times a week.   BENZONATATE (TESSALON) 200 MG CAPSULE    TAKE 1 CAPSULE (200 MG TOTAL) BY MOUTH 3 (THREE) TIMES DAILY AS NEEDED  FOR COUGH.   CHOLECALCIFEROL (VITAMIN D3) 125 MCG (5000 UT) TABS    Take 5,000 Units by mouth daily.   CYPROHEPTADINE (PERIACTIN) 4 MG TABLET    Take 4 mg by mouth daily.   ETHAMBUTOL (MYAMBUTOL) 400 MG TABLET    Take 5 tablets (2,000 mg total) by mouth 3 (three) times a week.   FINASTERIDE (PROSCAR) 5 MG TABLET    Take 1 mg by mouth daily. Cut in 4 pieces   MULTIPLE VITAMIN (MULTIVITAMIN WITH MINERALS) TABS TABLET    Take 1 tablet by mouth daily. Reported on 06/28/2015   RIFAMPIN (RIFADIN) 300 MG CAPSULE    Take 2 capsules (600 mg total) by mouth 3 (three) times a week.  Modified Medications   No medications on file  Discontinued Medications   No medications on file    Subjective: Randall Bradley is in for his routine follow-up visit.  He was recently diagnosed with Mycobacterium avium pneumonia which has been smoldering along.  He started azithromycin, ethambutol and rifampin 02/23/2022.  He takes them each evening before bedtime.  He says that the day after taking them he will notice some loose stool that occurs 2-3 times.  He also may have a slight increase in his chronic acid indigestion but he has not needed to take anything for that.  He says that his cough persists but may not be as "deep".  He is still bringing up a regular sputum, especially in the morning.  He says that he notices slight worsening of his shortness of  breath but says that this may be due to the fact that if he is doing more vigorous exercise.  Review of Systems: Review of Systems  Constitutional:  Negative for fever and weight loss.  Respiratory:  Positive for cough, sputum production, shortness of breath and wheezing.   Cardiovascular:  Negative for chest pain.  Gastrointestinal:  Positive for heartburn. Negative for abdominal pain, diarrhea, nausea and vomiting.       As noted in HPI.    Past Medical History:  Diagnosis Date   Alopecia    Arthritis    finger   Bilateral carpal tunnel syndrome 07/12/2020   COVID 03/2021    mild   GERD (gastroesophageal reflux disease)    mild   Hematuria    Hemochromatosis    Hemochromatosis, hereditary ([place]) 03/16/2020   Hereditary hemochromatosis ([place]) monitored by PCP  dr [name] gates   history of phlebotomies -- last one 2014 (pt states body has normalized)   History of bladder stone    History of kidney stones    Hypertension    no medications at this time.   Prostate cancer ([place])    UROLOGIST-- DR Diona Fanti; S/P PROSTATECTOMY 03-17-2008   Right ureteral stone    Rosacea     Social History   Tobacco Use   Smoking status: Former    Packs/day: 0.25    Years: 15.00    Total pack years: 3.75    Types: Cigarettes    Quit date: 10/07/1993    Years since quitting: 28.4   Smokeless tobacco: Never  Vaping Use   Vaping Use: Never used  Substance Use Topics   Alcohol use: Not Currently    Comment: "I quit drinking in ~ 2004"   Drug use: No    No family history on file.  Allergies  Allergen Reactions   Ropinirole Other (See Comments)    Other reaction(s): dizziness    Objective: Vitals:   03/22/22 0958  BP: (!) 145/80  Pulse: 65  Temp: 97.6 F (36.4 C)  TempSrc: Oral  Weight: 196 lb (88.9 kg)  Height: '6\' 2"'$  (1.88 m)   Body mass index is 25.16 kg/m.  Physical Exam Constitutional:      Comments: He is in good spirits.  Cardiovascular:     Rate and Rhythm: Normal rate and regular rhythm.     Heart sounds: No murmur heard. Pulmonary:     Effort: Pulmonary effort is normal.     Breath sounds: Normal breath sounds. No wheezing, rhonchi or rales.     Lab Results    Problem List Items Addressed This Visit       High   Mycobacterium avium infection ([place])    It is too early to expect a dramatic improvement and has chronic, smoldering Mycobacterium avium pneumonia.  He will continue his current 3 drug antibiotic regimen.  I will check lab work today and a repeat sputum AFB smear and culture.  He will follow-up in 2 months.      Relevant  Orders   CBC   Comprehensive metabolic panel   MYCOBACTERIA, CULTURE, WITH FLUOROCHROME SMEAR     Michel Bickers, MD [facility] for [place] [phone] pager   [phone] cell 03/22/2022, 10:19 AM

## 2022-03-23 ENCOUNTER — Other Ambulatory Visit: Payer: BLUE CROSS/BLUE SHIELD

## 2022-03-23 ENCOUNTER — Other Ambulatory Visit: Payer: Self-pay

## 2022-03-23 DIAGNOSIS — A310 Pulmonary mycobacterial infection: Secondary | ICD-10-CM

## 2022-03-23 LAB — COMPREHENSIVE METABOLIC PANEL
AG Ratio: 1.8 (calc) (ref 1.0–2.5)
ALT: 15 U/L (ref 9–46)
AST: 17 U/L (ref 10–35)
Albumin: 4.2 g/dL (ref 3.6–5.1)
Alkaline phosphatase (APISO): 90 U/L (ref 35–144)
BUN: 25 mg/dL (ref 7–25)
CO2: 30 mmol/L (ref 20–32)
Calcium: 9.4 mg/dL (ref 8.6–10.3)
Chloride: 103 mmol/L (ref 98–110)
Creat: 1.05 mg/dL (ref 0.70–1.35)
Globulin: 2.4 g/dL (calc) (ref 1.9–3.7)
Glucose, Bld: 71 mg/dL (ref 65–99)
Potassium: 4.3 mmol/L (ref 3.5–5.3)
Sodium: 141 mmol/L (ref 135–146)
Total Bilirubin: 0.8 mg/dL (ref 0.2–1.2)
Total Protein: 6.6 g/dL (ref 6.1–8.1)

## 2022-03-23 LAB — CBC
HCT: 43.8 % (ref 38.5–50.0)
Hemoglobin: 14.9 g/dL (ref 13.2–17.1)
MCH: 30 pg (ref 27.0–33.0)
MCHC: 34 g/dL (ref 32.0–36.0)
MCV: 88.3 fL (ref 80.0–100.0)
MPV: 9.6 fL (ref 7.5–12.5)
Platelets: 174 10*3/uL (ref 140–400)
RBC: 4.96 10*6/uL (ref 4.20–5.80)
RDW: 12.1 % (ref 11.0–15.0)
WBC: 3.9 10*3/uL (ref 3.8–10.8)

## 2022-04-06 ENCOUNTER — Encounter: Payer: Self-pay | Admitting: Hematology & Oncology

## 2022-04-06 ENCOUNTER — Inpatient Hospital Stay: Payer: BLUE CROSS/BLUE SHIELD

## 2022-04-06 ENCOUNTER — Telehealth: Payer: Self-pay | Admitting: *Deleted

## 2022-04-06 ENCOUNTER — Other Ambulatory Visit: Payer: Self-pay

## 2022-04-06 ENCOUNTER — Inpatient Hospital Stay: Payer: BLUE CROSS/BLUE SHIELD | Attending: Hematology & Oncology | Admitting: Hematology & Oncology

## 2022-04-06 DIAGNOSIS — R053 Chronic cough: Secondary | ICD-10-CM | POA: Diagnosis not present

## 2022-04-06 DIAGNOSIS — Z79899 Other long term (current) drug therapy: Secondary | ICD-10-CM | POA: Insufficient documentation

## 2022-04-06 LAB — CMP (CANCER CENTER ONLY)
ALT: 14 U/L (ref 0–44)
AST: 17 U/L (ref 15–41)
Albumin: 4.2 g/dL (ref 3.5–5.0)
Alkaline Phosphatase: 78 U/L (ref 38–126)
Anion gap: 7 (ref 5–15)
BUN: 22 mg/dL (ref 8–23)
CO2: 30 mmol/L (ref 22–32)
Calcium: 9.6 mg/dL (ref 8.9–10.3)
Chloride: 103 mmol/L (ref 98–111)
Creatinine: 1.27 mg/dL — ABNORMAL HIGH (ref 0.61–1.24)
GFR, Estimated: >60 mL/min (ref >60)
Glucose, Bld: 85 mg/dL (ref 70–99)
Potassium: 4.2 mmol/L (ref 3.5–5.1)
Sodium: 140 mmol/L (ref 135–145)
Total Bilirubin: 0.5 mg/dL (ref 0.3–1.2)
Total Protein: 6.9 g/dL (ref 6.5–8.1)

## 2022-04-06 LAB — CBC WITH DIFFERENTIAL (CANCER CENTER ONLY)
Abs Immature Granulocytes: 0.01 10*3/uL (ref 0.00–0.07)
Basophils Absolute: 0 10*3/uL (ref 0.0–0.1)
Basophils Relative: 1 %
Eosinophils Absolute: 0.2 10*3/uL (ref 0.0–0.5)
Eosinophils Relative: 5 %
HCT: 45 % (ref 39.0–52.0)
Hemoglobin: 15.2 g/dL (ref 13.0–17.0)
Immature Granulocytes: 0 %
Lymphocytes Relative: 27 %
Lymphs Abs: 1.3 10*3/uL (ref 0.7–4.0)
MCH: 29.9 pg (ref 26.0–34.0)
MCHC: 33.8 g/dL (ref 30.0–36.0)
MCV: 88.4 fL (ref 80.0–100.0)
Monocytes Absolute: 0.5 10*3/uL (ref 0.1–1.0)
Monocytes Relative: 10 %
Neutro Abs: 2.8 10*3/uL (ref 1.7–7.7)
Neutrophils Relative %: 57 %
Platelet Count: 198 10*3/uL (ref 150–400)
RBC: 5.09 MIL/uL (ref 4.22–5.81)
RDW: 12.4 % (ref 11.5–15.5)
WBC Count: 4.8 10*3/uL (ref 4.0–10.5)
nRBC: 0 % (ref 0.0–0.2)

## 2022-04-06 LAB — IRON AND IRON BINDING CAPACITY (CC-WL,HP ONLY)
Iron: 172 ug/dL (ref 45–182)
Saturation Ratios: 69 % — ABNORMAL HIGH (ref 17.9–39.5)
TIBC: 251 ug/dL (ref 250–450)
UIBC: 79 ug/dL — ABNORMAL LOW (ref 117–376)

## 2022-04-06 LAB — FERRITIN: Ferritin: 26 ng/mL (ref 24–336)

## 2022-04-06 NOTE — Progress Notes (Signed)
Hematology and Oncology Follow Up Visit  Randall Bradley [id number] [birth year] 66 y.o. 04/06/2022   Principle Diagnosis:  Hereditary hemochromatosis (C282Y Homozygous)  Current Therapy:   Phlebotomy/blood donation to maintain ferritin less than 50 and iron saturation less than 30%     Interim History:  Mr. Randall Bradley is back for follow-up.  Unfortunately, he now has been diagnosed with MAI.  He has been have this chronic cough.  He has been seen Pulmonary.  He has been seeing ID.  He now is on antibiotics for MAI.  I think because this, he is not can be able to donate blood to the [employer].  When we last has his labs checked, his ferritin was 25 with an iron saturation of 23%.  He is quite busy.  [place]  Hopefully, he will be able to buy something and build.  He still has little bit of a cough.  He still exercises.  He still works out.  He and his wife went to [place] earlier this year.  They had a wonderful time.  He has had no fever.  He has had no bleeding.  He has had no nausea or vomiting.  Overall, I would say his performance status is probably ECOG 0.    Medications:  Current Outpatient Medications:    ampicillin (PRINCIPEN) 500 MG capsule, Take 500 mg by mouth daily., Disp: , Rfl:    aspirin EC 81 MG tablet, Take 81 mg by mouth daily., Disp: , Rfl:    azithromycin (ZITHROMAX) 500 MG tablet, Take 1 tablet (500 mg total) by mouth 3 (three) times a week., Disp: 12 tablet, Rfl: 11   benzonatate (TESSALON) 200 MG capsule, TAKE 1 CAPSULE (200 MG TOTAL) BY MOUTH 3 (THREE) TIMES DAILY AS NEEDED FOR COUGH., Disp: 30 capsule, Rfl: 1   Cholecalciferol (VITAMIN D3) 125 MCG (5000 UT) TABS, Take 5,000 Units by mouth daily., Disp: , Rfl:    cyproheptadine (PERIACTIN) 4 MG tablet, Take 4 mg by mouth daily., Disp: , Rfl:    ethambutol (MYAMBUTOL) 400 MG tablet, Take 5 tablets (2,000 mg total) by mouth 3 (three) times a week., Disp: 60 tablet, Rfl: 11    finasteride (PROSCAR) 5 MG tablet, Take 1 mg by mouth daily. Cut in 4 pieces, Disp: , Rfl:    Multiple Vitamin (MULTIVITAMIN WITH MINERALS) TABS tablet, Take 1 tablet by mouth daily. Reported on 06/28/2015, Disp: , Rfl:    rifampin (RIFADIN) 300 MG capsule, Take 2 capsules (600 mg total) by mouth 3 (three) times a week., Disp: 24 capsule, Rfl: 11  Allergies:  Allergies  Allergen Reactions   Ropinirole Other (See Comments)    Other reaction(s): dizziness     Past Medical History, Surgical history, Social history, and Family History were reviewed and updated.  Review of Systems: Review of Systems  Constitutional: Negative.   HENT:  Negative.    Eyes: Negative.   Respiratory: Negative.    Cardiovascular: Negative.   Gastrointestinal: Negative.   Endocrine: Negative.   Genitourinary: Negative.    Musculoskeletal: Negative.   Skin: Negative.   Neurological: Negative.   Hematological: Negative.   Psychiatric/Behavioral: Negative.      Physical Exam:  height is '6\' 2"'$  (1.88 m) and weight is 196 lb 0.6 oz (88.9 kg). His oral temperature is 97.5 F (36.4 C) (abnormal). His blood pressure is 125/79 and his pulse is 60. His respiration is 18 and oxygen saturation is 100%.  Wt Readings from Last 3 Encounters:  04/06/22 196 lb 0.6 oz (88.9 kg)  03/22/22 196 lb (88.9 kg)  02/28/22 193 lb 12.8 oz (87.9 kg)    Physical Exam Vitals reviewed.  HENT:     Head: Normocephalic and atraumatic.  Eyes:     Pupils: Pupils are equal, round, and reactive to light.  Cardiovascular:     Rate and Rhythm: Normal rate and regular rhythm.     Heart sounds: Normal heart sounds.  Pulmonary:     Effort: Pulmonary effort is normal.     Breath sounds: Normal breath sounds.  Abdominal:     General: Bowel sounds are normal.     Palpations: Abdomen is soft.  Musculoskeletal:        General: No tenderness or deformity. Normal range of motion.     Cervical back: Normal range of motion.  Lymphadenopathy:      Cervical: No cervical adenopathy.  Skin:    General: Skin is warm and dry.     Findings: No erythema or rash.  Neurological:     Mental Status: He is alert and oriented to person, place, and time.  Psychiatric:        Behavior: Behavior normal.        Thought Content: Thought content normal.        Judgment: Judgment normal.      Lab Results  Component Value Date   WBC 4.8 04/06/2022   HGB 15.2 04/06/2022   HCT 45.0 04/06/2022   MCV 88.4 04/06/2022   PLT 198 04/06/2022     Chemistry      Component Value Date/Time   NA 141 03/22/2022 1020   NA 141 10/11/2020 0841   K 4.3 03/22/2022 1020   CL 103 03/22/2022 1020   CO2 30 03/22/2022 1020   BUN 25 03/22/2022 1020   BUN 21 10/11/2020 0841   CREATININE 1.05 03/22/2022 1020      Component Value Date/Time   CALCIUM 9.4 03/22/2022 1020   ALKPHOS 90 09/13/2021 1512   AST 17 03/22/2022 1020   AST 19 09/13/2021 1512   ALT 15 03/22/2022 1020   ALT 13 09/13/2021 1512   BILITOT 0.8 03/22/2022 1020   BILITOT 0.4 09/13/2021 1512      Impression and Plan: Mr. Randall Bradley is a very nice 66 year old white male.  He has hereditary hemochromatosis.  He has the major mutation.  He is homozygous for this.  We will see what his iron studies look like.  If we need to, we will phlebotomize him.  I hate that he has at this MAI.  He looks great.  He is being followed aggressively by ID.  He is on the triple antibiotic regimen for MAI.  For now, we will still plan to get him back in 6 months.  If we need to get back for a phlebotomy we will certainly make a call.      Randall Napoleon, MD 10/13/20238:44 AM

## 2022-04-06 NOTE — Telephone Encounter (Signed)
Per result message - called and lvm for a callback to schedule a phlebotomy per Dr. Marin Olp.

## 2022-04-09 ENCOUNTER — Telehealth: Payer: Self-pay | Admitting: *Deleted

## 2022-04-09 ENCOUNTER — Other Ambulatory Visit: Payer: Self-pay | Admitting: Student

## 2022-04-09 NOTE — Telephone Encounter (Signed)
Per results message - called again and lvm for a callback to schedule a phlebotomy per Dr. Marin Olp.

## 2022-04-10 ENCOUNTER — Inpatient Hospital Stay: Payer: BLUE CROSS/BLUE SHIELD

## 2022-04-10 NOTE — Patient Instructions (Signed)

## 2022-04-10 NOTE — Progress Notes (Signed)
1 unit phlebotomy performed over 7 minutes using a 16 gauge phlebotomy set to the right AC. Patient tolerated well. Nourishment provided.

## 2022-04-18 ENCOUNTER — Other Ambulatory Visit: Payer: Self-pay | Admitting: Student

## 2022-04-18 ENCOUNTER — Ambulatory Visit
Admission: RE | Admit: 2022-04-18 | Discharge: 2022-04-18 | Disposition: A | Discharge status: Home / Self Care | Payer: BLUE CROSS/BLUE SHIELD | Source: Ambulatory Visit | Attending: Student | Admitting: Student

## 2022-04-18 DIAGNOSIS — R918 Other nonspecific abnormal finding of lung field: Secondary | ICD-10-CM

## 2022-04-25 NOTE — Progress Notes (Signed)
hi  Synopsis: Referred for persistent cough by [name], MD  Subjective:   PATIENT ID: Randall Bradley GENDER: male DOB: 1956/01/19, MRN: [id number]  Chief Complaint  Patient presents with   Follow-up    [name] with history of remote smoking <3-4 pack years quit 1995 social smoking, hereditary hemochromatosis undergoing therapeutic phlebotomy, ACDF 4-7 surgery 03/2020, HTN, remote smoking who is referred for cough failing to respond to [facility].  He says it's been an issue for about a year and a half. 10 years ago had a cough and was told by allergist that it was possibly due to GERD. Tried H2B and initially it was helpful. He did Barium swallow last week. Has been seeing GI recently in part for cough and this was ordered as part of workup. Getting another speech evaluation tomorrow - does endorse some trouble swallowing/aspiration. May get EGD when he goes for colonoscopy. Sees [name] at [facility]. Only functional issue he has with cough is that it wakes him up at night. 2-3 times per day coughs up a little sputum. Has been on and off Breo for years. He was rinsing mouth out when he was using.   He does have some symptomatic reflux with bitter/metallic taste when he breathes in. Currently taking only pepcid at night before bedtime. Sounds like ppi is on hold till EGD.   Never has had PFTs, asthma as a kid.   He does have some post-nasal drainage. He uses no nasal spray.   He has no accompanying dyspnea.    Retired, worked for a Journalist, newspaper, worked as a Physicist, medical up until about 2 years ago. His symptoms were not worse at all during that. No symptom worsening with hay exposure either. [place] - had a friend who had life-threatening endemic fungal infection there.   No family history of lung disease.   Interval HPI: Maintained on RA TIW for MAI, AFB sputum 9/29 smear negative, cx  pending  Dominant nodule on CT Chest has resolved.   Otherwise pertinent review of systems is negative.   Past Medical History:  Diagnosis Date   Alopecia    Arthritis    finger   Bilateral carpal tunnel syndrome 07/12/2020   COVID 03/2021   mild   GERD (gastroesophageal reflux disease)    mild   Hematuria    Hemochromatosis    Hemochromatosis, hereditary ([place]) 03/16/2020   Hereditary hemochromatosis ([place]) monitored by PCP  dr [name] gates   history of phlebotomies -- last one 2014 (pt states body has normalized)   History of bladder stone    History of kidney stones    Hypertension    no medications at this time.   Prostate cancer ([place])    UROLOGIST-- DR Diona Fanti; S/P PROSTATECTOMY 03-17-2008   Right ureteral stone    Rosacea      No family history on file.   Past Surgical History:  Procedure Laterality Date   APPENDECTOMY  1963   BRONCHIAL WASHINGS  01/05/2022   Procedure: BRONCHIAL WASHINGS;  Surgeon: Maryjane Hurter, MD;  Location: [facility];  Service: Pulmonary;;   COLONOSCOPY     COMPLEX WOUND CLOSURE Left 03-26-2009   INDEX FINGER COMPLEX WOUND LACERATION REPAIR   CYSTOLITHOLAPAXY AND URETHRAL DILATION  10-05-2009   CYSTOSCOPY W/ RETROGRADES Right 10/11/2014   Procedure: CYSTOSCOPY WITH RETROGRADE PYELOGRAM;  Surgeon: Franchot Gallo, MD;  Location[facility];  Service: Urology;  Laterality: Right;   HOLMIUM LASER APPLICATION N/A 6/31/4970   Procedure: HOLMIUM LASER LITHOTRIPSY, ;  Surgeon: Franchot Gallo, MD;  Location: [facility];  Service: Urology;  Laterality: N/A;   JOINT REPLACEMENT     KNEE ARTHROSCOPY Left 1995   LIPOMA EXCISION  03-04-2000   forehead   ROBOT ASSISTED LAPAROSCOPIC RADICAL PROSTATECTOMY  03-17-2008   w/  BILATERAL PELVIC LYMPHADENECTOMY (NON-NERVE SPARING)   TOTAL KNEE ARTHROPLASTY Left 04/27/2015   Procedure: TOTAL KNEE ARTHROPLASTY;  Surgeon: [name], MD;  Location: [facility];   Service: Orthopedics;  Laterality: Left;   TOTAL KNEE ARTHROPLASTY Right 06/08/2015   TOTAL KNEE ARTHROPLASTY Right 06/08/2015   Procedure: TOTAL RIGHT KNEE ARTHROPLASTY;  Surgeon: [name], MD;  Location: [facility];  Service: Orthopedics;  Laterality: Right;   VIDEO BRONCHOSCOPY Right 01/05/2022   Procedure: VIDEO BRONCHOSCOPY WITHOUT FLUORO;  Surgeon: Maryjane Hurter, MD;  Location[facility];  Service: Pulmonary;  Laterality: Right;    Social History   Socioeconomic History   Marital status: Single    Spouse name: Not on file   Number of children: Not on file   Years of education: Not on file   Highest education level: Not on file  Occupational History   Not on file  Tobacco Use   Smoking status: Former    Packs/day: 0.25    Years: 15.00    Total pack years: 3.75    Types: Cigarettes    Quit date: 10/07/1993    Years since quitting: 28.5   Smokeless tobacco: Never  Vaping Use   Vaping Use: Never used  Substance and Sexual Activity   Alcohol use: Not Currently    Comment: "I quit drinking in ~ 2004"   Drug use: No   Sexual activity: Never  Other Topics Concern   Not on file  Social History Narrative   Not on file   Social Determinants of Health   Financial Resource Strain: Not on file  Food Insecurity: Not on file  Transportation Needs: Not on file  Physical Activity: Not on file  Stress: Not on file  Social Connections: Not on file  Intimate Partner Violence: Not on file     Allergies  Allergen Reactions   Ropinirole Other (See Comments)    Other reaction(s): dizziness      Outpatient Medications Prior to Visit  Medication Sig Dispense Refill   ampicillin (PRINCIPEN) 500 MG capsule Take 500 mg by mouth daily.     aspirin EC 81 MG tablet Take 81 mg by mouth daily.     azithromycin (ZITHROMAX) 500 MG tablet Take 1 tablet (500 mg total) by mouth 3 (three) times a week. 12 tablet 11   benzonatate (TESSALON) 200 MG capsule TAKE 1 CAPSULE (200 MG  TOTAL) BY MOUTH 3 (THREE) TIMES DAILY AS NEEDED FOR COUGH. 30 capsule 1   cyproheptadine (PERIACTIN) 4 MG tablet Take 4 mg by mouth daily.     ethambutol (MYAMBUTOL) 400 MG tablet Take 5 tablets (2,000 mg total) by mouth 3 (three) times a week. 60 tablet 11   finasteride (PROSCAR) 5 MG tablet Take 1 mg by mouth daily. Cut in 4 pieces     ipratropium (ATROVENT) 0.03 % nasal spray PLACE 2 SPRAYS INTO BOTH NOSTRILS 3 (THREE) TIMES DAILY 30 mL 4   Multiple Vitamin (MULTIVITAMIN WITH MINERALS) TABS tablet Take 1 tablet by mouth daily. Reported on 06/28/2015     rifampin (RIFADIN) 300 MG capsule Take 2  capsules (600 mg total) by mouth 3 (three) times a week. 24 capsule 11   Cholecalciferol (VITAMIN D3) 125 MCG (5000 UT) TABS Take 5,000 Units by mouth daily.     No facility-administered medications prior to visit.       Objective:   Physical Exam:  General appearance: 66 y.o., male, NAD, conversant  Eyes: anicteric sclerae; PERRL, tracking appropriately HENT: NCAT; MMM, frequent throat clearing during exam Neck: Trachea midline; no lymphadenopathy, no JVD Lungs: no wheeze, with normal respiratory effort CV: RRR Abdomen: non-distended  Extremities: No peripheral edema, warm Skin: Normal turgor and texture; no rash Psych: Appropriate affect Neuro: Alert and oriented to person and place, no focal deficit     Vitals:   05/01/22 0915  BP: 118/80  Pulse: 70  SpO2: 99%  Weight: 195 lb 6.4 oz (88.6 kg)  Height: '6\' 2"'$  (1.88 m)         99% on RA BMI Readings from Last 3 Encounters:  05/01/22 25.09 kg/m  04/06/22 25.17 kg/m  03/22/22 25.16 kg/m   Wt Readings from Last 3 Encounters:  05/01/22 195 lb 6.4 oz (88.6 kg)  04/06/22 196 lb 0.6 oz (88.9 kg)  03/22/22 196 lb (88.9 kg)     CBC    Component Value Date/Time   WBC 4.8 04/06/2022 0817   WBC 3.9 03/22/2022 1020   RBC 5.09 04/06/2022 0817   HGB 15.2 04/06/2022 0817   HCT 45.0 04/06/2022 0817   PLT 198 04/06/2022  0817   MCV 88.4 04/06/2022 0817   MCH 29.9 04/06/2022 0817   MCHC 33.8 04/06/2022 0817   RDW 12.4 04/06/2022 0817   LYMPHSABS 1.3 04/06/2022 0817   MONOABS 0.5 04/06/2022 0817   EOSABS 0.2 04/06/2022 0817   BASOSABS 0.0 04/06/2022 0817      Chest Imaging: CT Chest 3/30 reviewed by me and remarkable for a couple small nodules and a lot of subtle micronodularity/TIB overall pretty similar in appearance (apart from new 3-56m nodules) to 2006 study  Esophagram 02/01/21: 1. Aspiration with thick liquids. 2. Esophageal dysmotility.  CXR 04/2021 reviewed by me normal  CT Chest 12/26/21 reviewed by me with RUL TIB bronchiolitis, 1cm nodular consolidation anterior RUL  CT Chest 04/20/22 reviewed by me with occasional focus of bronchiolitis, resolved dominant nodular consolidation RUL  AFB sputum 8/10 with MAI  AFB sputum 9/29 smear negative, NGTD  Pulmonary Functions Testing Results:    Latest Ref Rng & Units 03/10/2021   12:01 PM  PFT Results  FVC-Pre L 4.76   FVC-Predicted Pre % 90   FVC-Post L 4.91   FVC-Predicted Post % 93   Pre FEV1/FVC % % 73   Post FEV1/FCV % % 76   FEV1-Pre L 3.47   FEV1-Predicted Pre % 89   FEV1-Post L 3.71   DLCO uncorrected ml/min/mmHg 28.10   DLCO UNC% % 94   DLCO corrected ml/min/mmHg 28.10   DLCO COR %Predicted % 94   DLVA Predicted % 93   TLC L 8.12   TLC % Predicted % 103   RV % Predicted % 117    Normal PFT, normal FV loops   Echocardiogram:  TTE 2016 with G1DD     Assessment & Plan:   # Bronchiolitis: # Pulmonary nodules: # 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 He does however also worry about potential exposures from  when [place] was in [place].  # Chronic cough: I still feel LPR with/without recurrent aspiration is contributory and he's been off of  ppi for some time now - frequent throat clearing in clinic, prior ACDF which may increase his risk for this and fits timing for symptomatic worsening, esophagram with 01/2021 with aspiration/esophageal dysmotility, EGD with underlying ulcer disease. Haven't completely ruled out asthma/NAEB. Has had intermittent issues with PND as well. He is however doing with lifestyle measures for reflux and he believes with treatment of his NTM infection.  Plan: - ABX per ID - flonase/neti - referral made to cardiology today to discuss atherosclerosis per pt request - we may be circling back to ppi at some point for LPR. Not sure if GI ever did work up a potential esophageal dysmotility issue or if just underwent EGD - tessalon perles/dextromethorphan prn   Return to care July 2024 as he nears 1 year of treatment of NTM  Maryjane Hurter, MD [facility] 05/01/2022 12:01 PM

## 2022-05-01 ENCOUNTER — Encounter: Payer: Self-pay | Admitting: Student

## 2022-05-01 ENCOUNTER — Ambulatory Visit (INDEPENDENT_AMBULATORY_CARE_PROVIDER_SITE_OTHER): Payer: BLUE CROSS/BLUE SHIELD | Admitting: Student

## 2022-05-01 VITALS — BP 118/80 | HR 70 | Ht 74.0 in | Wt 195.4 lb

## 2022-05-01 DIAGNOSIS — R053 Chronic cough: Secondary | ICD-10-CM

## 2022-05-01 DIAGNOSIS — I7090 Unspecified atherosclerosis: Secondary | ICD-10-CM

## 2022-05-01 NOTE — Patient Instructions (Addendum)
-   Referral made to cardiology today to discuss atherosclerosis - See you in August or sooner if need be - flonase/neti pot

## 2022-05-08 LAB — MYCOBACTERIA,CULT W/FLUOROCHROME SMEAR
MICRO NUMBER:: 13988908
SMEAR:: NONE SEEN
SPECIMEN QUALITY:: ADEQUATE

## 2022-05-24 ENCOUNTER — Encounter: Payer: Self-pay | Admitting: Internal Medicine

## 2022-05-24 ENCOUNTER — Ambulatory Visit (INDEPENDENT_AMBULATORY_CARE_PROVIDER_SITE_OTHER): Payer: BLUE CROSS/BLUE SHIELD | Admitting: Internal Medicine

## 2022-05-24 ENCOUNTER — Other Ambulatory Visit: Payer: Self-pay

## 2022-05-24 VITALS — BP 129/90 | HR 73 | Temp 97.5°F | Resp 16 | Ht 74.0 in | Wt 199.2 lb

## 2022-05-24 DIAGNOSIS — A310 Pulmonary mycobacterial infection: Secondary | ICD-10-CM | POA: Diagnosis not present

## 2022-05-24 NOTE — Progress Notes (Signed)
[facility] for Infectious Disease  Patient Active Problem List   Diagnosis Date Noted   Mycobacterium avium infection ([place])     Priority: High   Chronic cough 05/29/2018    Priority: High   Helicobacter pylori (H. pylori) infection 01/31/2022   Esophageal dysmotility 01/31/2022   H/O radical prostatectomy 01/10/2022   Bilateral carpal tunnel syndrome 07/12/2020   Hemochromatosis, hereditary ([place]) 03/16/2020   Allergic rhinitis 05/29/2018   Artificial knee joint present 05/29/2018   Asthma without status asthmaticus 05/29/2018   Disorder of iron metabolism 05/29/2018   Encounter for general adult medical examination without abnormal findings 05/29/2018   History of colon polyps 05/29/2018   Inguinal hernia without obstruction or gangrene 05/29/2018   Insomnia due to medical condition 05/29/2018   Malignant neoplasm of prostate ([place]) 05/29/2018   Osteoarthrosis involving more than one site but not generalized 05/29/2018   Other long term (current) drug therapy 05/29/2018   Other specified extrapyramidal and movement disorders 05/29/2018   Pain in right knee 05/29/2018   Pure hypercholesterolemia 05/29/2018   Dextroscoliosis 08/01/2015   Essential hypertension 06/30/2015   Family history of heart disease 06/30/2015   Left ventricular hypertrophy due to hypertensive disease 06/30/2015   Left ventricular diastolic dysfunction, NYHA class 1 06/30/2015   Fast heart beat 05/11/2015   DJD (degenerative joint disease) of knee 04/27/2015    Patient's Medications  New Prescriptions   No medications on file  Previous Medications   AMPICILLIN (PRINCIPEN) 500 MG CAPSULE    Take 500 mg by mouth daily.   ASPIRIN EC 81 MG TABLET    Take 81 mg by mouth daily.   AZITHROMYCIN (ZITHROMAX) 500 MG TABLET    Take 1 tablet (500 mg total) by mouth 3 (three) times a week.   BENZONATATE (TESSALON) 200 MG CAPSULE    TAKE 1 CAPSULE (200 MG TOTAL) BY MOUTH 3 (THREE) TIMES DAILY AS NEEDED  FOR COUGH.   CYPROHEPTADINE (PERIACTIN) 4 MG TABLET    Take 4 mg by mouth daily.   ETHAMBUTOL (MYAMBUTOL) 400 MG TABLET    Take 5 tablets (2,000 mg total) by mouth 3 (three) times a week.   FINASTERIDE (PROSCAR) 5 MG TABLET    Take 1 mg by mouth daily. Cut in 4 pieces   IPRATROPIUM (ATROVENT) 0.03 % NASAL SPRAY    PLACE 2 SPRAYS INTO BOTH NOSTRILS 3 (THREE) TIMES DAILY   MULTIPLE VITAMIN (MULTIVITAMIN WITH MINERALS) TABS TABLET    Take 1 tablet by mouth daily. Reported on 06/28/2015   RIFAMPIN (RIFADIN) 300 MG CAPSULE    Take 2 capsules (600 mg total) by mouth 3 (three) times a week.  Modified Medications   No medications on file  Discontinued Medications   No medications on file    Subjective: Hence is in for his routine follow-up visit.  He started on 3 times weekly azithromycin, ethambutol and rifampin on 02/23/2022 for his smoldering Mycobacterium avium pneumonia.  He is tolerating his medications well.  He has noticed significant improvement in his cough.  He also still brings up a fair amount of sputum in the morning but it is much less than before starting therapy.  He is doing more vigorous workouts at the gym and no longer is bothered by dyspnea on exertion.  A repeat sputum AFB smear on 03/23/2022 was negative and cultures were negative as well.  Follow-up chest CT scan on 04/20/2022 showed improvement with resolution of his right upper  lobe nodule and decreased tree-in-bud inflammation bilaterally.  Review of Systems: Review of Systems  Constitutional:  Negative for fever and weight loss.  Respiratory:  Positive for cough and sputum production. Negative for hemoptysis and shortness of breath.     Past Medical History:  Diagnosis Date   Alopecia    Arthritis    finger   Bilateral carpal tunnel syndrome 07/12/2020   COVID 03/2021   mild   GERD (gastroesophageal reflux disease)    mild   Hematuria    Hemochromatosis    Hemochromatosis, hereditary ([place]) 03/16/2020   Hereditary  hemochromatosis ([place]) monitored by PCP  dr [name] gates   history of phlebotomies -- last one 2014 (pt states body has normalized)   History of bladder stone    History of kidney stones    Hypertension    no medications at this time.   Prostate cancer ([place])    UROLOGIST-- DR Diona Fanti; S/P PROSTATECTOMY 03-17-2008   Right ureteral stone    Rosacea     Social History   Tobacco Use   Smoking status: Former    Packs/day: 0.25    Years: 15.00    Total pack years: 3.75    Types: Cigarettes    Quit date: 10/07/1993    Years since quitting: 28.6   Smokeless tobacco: Never  Vaping Use   Vaping Use: Never used  Substance Use Topics   Alcohol use: Not Currently    Comment: "I quit drinking in ~ 2004"   Drug use: No    No family history on file.  No Active Allergies  Objective: Vitals:   05/24/22 1007  BP: (!) 129/90  Pulse: 73  Resp: 16  Temp: (!) 97.5 F (36.4 C)  TempSrc: Oral  SpO2: 99%  Weight: 199 lb 3.2 oz (90.4 kg)  Height: '6\' 2"'$  (1.88 m)   Body mass index is 25.58 kg/m.  Physical Exam Constitutional:      Comments: He is very calm and pleasant and appears quite healthy.  Cardiovascular:     Rate and Rhythm: Normal rate.  Pulmonary:     Effort: Pulmonary effort is normal.  Psychiatric:        Mood and Affect: Mood normal.       Problem List Items Addressed This Visit       High   Mycobacterium avium infection ([place]) - Primary    He has had excellent clinical, microbiologic and radiologic improvement since starting therapy for Mycobacterium avium 3 months ago.  He has had his annual influenza vaccine.  He reminds me that he does not take COVID vaccines.  I suggested that he consider one of the new RSV vaccines.  I also suggested that he see his eye doctor to screen for any changes in color vision that might be associated with ethambutol.  Her target for therapy is 1 year after his recent negative culture.        Michel Bickers, MD [facility]  for [facility] [phone] pager   [phone] cell 05/24/2022, 10:27 AM

## 2022-05-24 NOTE — Assessment & Plan Note (Signed)
He has had excellent clinical, microbiologic and radiologic improvement since starting therapy for Mycobacterium avium 3 months ago.  He has had his annual influenza vaccine.  He reminds me that he does not take COVID vaccines.  I suggested that he consider one of the new RSV vaccines.  I also suggested that he see his eye doctor to screen for any changes in color vision that might be associated with ethambutol.  Her target for therapy is 1 year after his recent negative culture.

## 2022-05-30 ENCOUNTER — Telehealth: Payer: Self-pay | Admitting: Cardiovascular Disease

## 2022-05-30 NOTE — Telephone Encounter (Signed)
Pt requesting to switch from Dr. Claiborne Billings to Dr. Audie Box, is this okay with you all?

## 2022-06-11 NOTE — Telephone Encounter (Signed)
Calling back for an update on provider switch.

## 2022-06-12 ENCOUNTER — Other Ambulatory Visit: Payer: Self-pay | Admitting: Student

## 2022-06-12 NOTE — Telephone Encounter (Signed)
LVM to sch appt with Dr. Audie Box, okay per Jeneen Rinks.

## 2022-06-19 NOTE — Telephone Encounter (Signed)
ok 

## 2022-06-20 NOTE — Telephone Encounter (Signed)
Done, patient scheduled with Dr.O'Neal 09/19/22

## 2022-07-25 ENCOUNTER — Encounter: Payer: Self-pay | Admitting: Internal Medicine

## 2022-07-25 ENCOUNTER — Ambulatory Visit (INDEPENDENT_AMBULATORY_CARE_PROVIDER_SITE_OTHER): Payer: BLUE CROSS/BLUE SHIELD | Admitting: Internal Medicine

## 2022-07-25 ENCOUNTER — Other Ambulatory Visit: Payer: Self-pay

## 2022-07-25 VITALS — BP 135/86 | HR 65 | Temp 97.9°F | Resp 16 | Ht 74.0 in | Wt 199.0 lb

## 2022-07-25 DIAGNOSIS — A310 Pulmonary mycobacterial infection: Secondary | ICD-10-CM

## 2022-07-25 NOTE — Assessment & Plan Note (Signed)
He has had a prompt and very good clinical, microbiologic and radiographic response to treatment for Mycobacterium avium pneumonia.  I have asked him to submit another sputum specimen for smear and culture.  He will continue his current 3 drug regimen and follow-up in 3 months.

## 2022-07-25 NOTE — Progress Notes (Addendum)
[facility] for Infectious Disease  Patient Active Problem List   Diagnosis Date Noted   Mycobacterium avium infection ([place])     Priority: High   Chronic cough 05/29/2018    Priority: High   Helicobacter pylori (H. pylori) infection 01/31/2022   Esophageal dysmotility 01/31/2022   H/O radical prostatectomy 01/10/2022   Bilateral carpal tunnel syndrome 07/12/2020   Hemochromatosis, hereditary ([place]) 03/16/2020   Allergic rhinitis 05/29/2018   Artificial knee joint present 05/29/2018   Asthma without status asthmaticus 05/29/2018   Disorder of iron metabolism 05/29/2018   Encounter for general adult medical examination without abnormal findings 05/29/2018   History of colon polyps 05/29/2018   Inguinal hernia without obstruction or gangrene 05/29/2018   Insomnia due to medical condition 05/29/2018   Malignant neoplasm of prostate ([place]) 05/29/2018   Osteoarthrosis involving more than one site but not generalized 05/29/2018   Other long term (current) drug therapy 05/29/2018   Other specified extrapyramidal and movement disorders 05/29/2018   Pain in right knee 05/29/2018   Pure hypercholesterolemia 05/29/2018   Dextroscoliosis 08/01/2015   Essential hypertension 06/30/2015   Family history of heart disease 06/30/2015   Left ventricular hypertrophy due to hypertensive disease 06/30/2015   Left ventricular diastolic dysfunction, NYHA class 1 06/30/2015   Fast heart beat 05/11/2015   DJD (degenerative joint disease) of knee 04/27/2015    Patient's Medications  New Prescriptions   No medications on file  Previous Medications   AMPICILLIN (PRINCIPEN) 500 MG CAPSULE    Take 500 mg by mouth daily.   ASPIRIN EC 81 MG TABLET    Take 81 mg by mouth daily.   AZITHROMYCIN (ZITHROMAX) 500 MG TABLET    Take 1 tablet (500 mg total) by mouth 3 (three) times a week.   BENZONATATE (TESSALON) 200 MG CAPSULE    TAKE 1 CAPSULE (200 MG TOTAL) BY MOUTH 3 (THREE) TIMES DAILY AS NEEDED  FOR COUGH.   CYPROHEPTADINE (PERIACTIN) 4 MG TABLET    Take 4 mg by mouth daily.   ETHAMBUTOL (MYAMBUTOL) 400 MG TABLET    Take 5 tablets (2,000 mg total) by mouth 3 (three) times a week.   FINASTERIDE (PROSCAR) 5 MG TABLET    Take 1 mg by mouth daily. Cut in 4 pieces   HYDROCHLOROTHIAZIDE (HYDRODIURIL) 12.5 MG TABLET    Take 12.5 mg by mouth every morning.   IPRATROPIUM (ATROVENT) 0.03 % NASAL SPRAY    PLACE 2 SPRAYS INTO BOTH NOSTRILS 3 (THREE) TIMES DAILY   MULTIPLE VITAMIN (MULTIVITAMIN WITH MINERALS) TABS TABLET    Take 1 tablet by mouth daily. Reported on 06/28/2015   RIFAMPIN (RIFADIN) 300 MG CAPSULE    Take 2 capsules (600 mg total) by mouth 3 (three) times a week.  Modified Medications   No medications on file  Discontinued Medications   No medications on file    Subjective: Randall Bradley is in for his routine follow-up visit.  He started on 3 times weekly azithromycin, ethambutol and rifampin on 02/23/2022 for smoldering mycobacterium avium pneumonia.  He has been tolerating his antibiotics well.  He still brings up a lot of sputum first thing in the morning and occasionally a little bit later in the afternoon but overall his cough and sputum production are markedly improved since starting therapy.  He is not having any problem with shortness of breath.  Repeat sputum AFB stain and culture in late September were both negative.  A CT scan on  04/18/2024 showed improvement in his bilateral infiltrates.  He is currently negotiating to buy a large property in [place].  Review of Systems: Review of Systems  Constitutional:  Negative for fever and weight loss.  Respiratory:  Positive for cough and sputum production. Negative for shortness of breath.   Cardiovascular:  Negative for chest pain.    Past Medical History:  Diagnosis Date   Alopecia    Arthritis    finger   Bilateral carpal tunnel syndrome 07/12/2020   COVID 03/2021   mild   GERD (gastroesophageal reflux disease)    mild    Hematuria    Hemochromatosis    Hemochromatosis, hereditary ([place]) 03/16/2020   Hereditary hemochromatosis ([place]) monitored by PCP  dr [name] gates   history of phlebotomies -- last one 2014 (pt states body has normalized)   History of bladder stone    History of kidney stones    Hypertension    no medications at this time.   Prostate cancer ([place])    UROLOGIST-- DR Diona Fanti; S/P PROSTATECTOMY 03-17-2008   Right ureteral stone    Rosacea     Social History   Tobacco Use   Smoking status: Former    Packs/day: 0.25    Years: 15.00    Total pack years: 3.75    Types: Cigarettes    Quit date: 10/07/1993    Years since quitting: 28.8   Smokeless tobacco: Never  Vaping Use   Vaping Use: Never used  Substance Use Topics   Alcohol use: Not Currently    Comment: "I quit drinking in ~ 2004"   Drug use: No    No family history on file.  No Known Allergies  Objective: Vitals:   07/25/22 0955 07/25/22 0957  BP: (!) 152/95 135/86  Pulse: 65   Resp: 16   Temp: 97.9 F (36.6 C)   TempSrc: Oral   SpO2: 98%   Weight: 199 lb (90.3 kg)   Height: '6\' 2"'$  (1.88 m)    Body mass index is 25.55 kg/m.  Physical Exam Constitutional:      Comments: He is in good spirits.  Cardiovascular:     Rate and Rhythm: Normal rate and regular rhythm.     Heart sounds: No murmur heard. Pulmonary:     Effort: Pulmonary effort is normal.     Breath sounds: Normal breath sounds. No wheezing, rhonchi or rales.  Psychiatric:        Mood and Affect: Mood normal.     Lab Results    Problem List Items Addressed This Visit       High   Mycobacterium avium infection ([place]) - Primary    He has had a prompt and very good clinical, microbiologic and radiographic response to treatment for Mycobacterium avium pneumonia.  I have asked him to submit another sputum specimen for smear and culture.  He will continue his current 3 drug regimen and follow-up in 3 months.      Relevant Orders    MYCOBACTERIA, CULTURE, WITH FLUOROCHROME SMEAR     Michel Bickers, MD [facility] for [facility] [phone] pager   [phone] cell 07/25/2022, 11:44 AM

## 2022-07-26 ENCOUNTER — Other Ambulatory Visit: Payer: BLUE CROSS/BLUE SHIELD

## 2022-07-26 ENCOUNTER — Other Ambulatory Visit: Payer: Self-pay

## 2022-07-26 DIAGNOSIS — A310 Pulmonary mycobacterial infection: Secondary | ICD-10-CM

## 2022-07-26 NOTE — Addendum Note (Signed)
Addended by: Daisy Floro T on: 07/26/2022 09:54 AM   Modules accepted: Orders

## 2022-08-22 ENCOUNTER — Ambulatory Visit: Payer: BLUE CROSS/BLUE SHIELD | Admitting: Cardiovascular Disease

## 2022-08-30 NOTE — Progress Notes (Signed)
Cardiology Office Note:   Date:  08/31/2022  NAME:  Randall Bradley    MRN: [id number] DOB:  1955/11/28   PCP:  [name], MD  Cardiologist:  None  Electrophysiologist:  None   Referring MD: [name], MD   Chief Complaint  Patient presents with   Follow-up        History of Present Illness:   Randall Bradley is a 67 y.o. male with a hx of coronary calcium who presents for follow-up.  Diagnosed with elevated coronary calcium score several years ago.  LDL cholesterol 59 on no medication.  He informs me he would like to avoid statin medications.  Likely reasonable given his very low LDL cholesterol.  He does have a family history of heart disease in both parents.  He is exercising walking 14 to 20 miles per week.  No chest pain or trouble breathing.  He reports his diet seems to be good.  Low in red meat.  He does have MAC infection.  On antibiotics.  Also has hemochromatosis which is followed by Dr. Marin Olp.  He is retired.  He worked for [employer] for 25 years.  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  Had other jobs.  He tells me he is retired 4 times.  CV examination normal.  He is a never smoker.  No alcohol or drug use is reported.  He is not married.  No children.  Denies any symptoms in office.  Blood pressure is well-controlled today.  Problem List Coronary calcium  -CAC 240 (68th percentile) HLD -T chol 118, HDL 47, LDL 59, TG 52 Hemochromatosis  Non-tuberculosis mycobacterium infection (MAC) HTN  Past Medical History: Past Medical History:  Diagnosis Date   Alopecia    Arthritis    finger   Bilateral carpal tunnel syndrome 07/12/2020   COVID 03/2021   mild   GERD (gastroesophageal reflux disease)    mild   Hematuria    Hemochromatosis    Hemochromatosis, hereditary ([place]) 03/16/2020   Hereditary hemochromatosis ([place]) monitored by PCP  dr [name] gates   history of phlebotomies -- last one 2014 (pt states body has normalized)   History of bladder stone     History of kidney stones    Hypertension    no medications at this time.   Prostate cancer ([place])    UROLOGIST-- DR Diona Fanti; S/P PROSTATECTOMY 03-17-2008   Right ureteral stone    Rosacea     Past Surgical History: Past Surgical History:  Procedure Laterality Date   APPENDECTOMY  06/25/1961   BRONCHIAL WASHINGS  01/05/2022   Procedure: BRONCHIAL WASHINGS;  Surgeon: Maryjane Hurter, MD;  Location: [place];  Service: Pulmonary;;   COLONOSCOPY     COMPLEX WOUND CLOSURE Left 03/26/2009   INDEX FINGER COMPLEX WOUND LACERATION REPAIR   CYSTOLITHOLAPAXY AND URETHRAL DILATION  10/05/2009   CYSTOSCOPY W/ RETROGRADES Right 10/11/2014   Procedure: CYSTOSCOPY WITH RETROGRADE PYELOGRAM;  Surgeon: Franchot Gallo, MD;  [facility];  Service: Urology;  Laterality: Right;   HOLMIUM LASER APPLICATION N/A [id number]   Procedure: HOLMIUM LASER LITHOTRIPSY, ;  Surgeon: Franchot Gallo, MD;  Location: [facility];  Service: Urology;  Laterality: N/A;   JOINT REPLACEMENT     KNEE ARTHROSCOPY Left 06/25/1993   LIPOMA EXCISION  03/04/2000   forehead   ROBOT ASSISTED LAPAROSCOPIC RADICAL PROSTATECTOMY  03/17/2008   w/  BILATERAL PELVIC LYMPHADENECTOMY (NON-NERVE SPARING)   TOTAL KNEE ARTHROPLASTY Left 04/27/2015  Procedure: TOTAL KNEE ARTHROPLASTY;  Surgeon: [name], MD;  Location: [place];  Service: Orthopedics;  Laterality: Left;   TOTAL KNEE ARTHROPLASTY Right 06/08/2015   TOTAL KNEE ARTHROPLASTY Right 06/08/2015   Procedure: TOTAL RIGHT KNEE ARTHROPLASTY;  Surgeon: [name], MD;  Location: [place];  Service: Orthopedics;  Laterality: Right;   VIDEO BRONCHOSCOPY Right 01/05/2022   Procedure: VIDEO BRONCHOSCOPY WITHOUT FLUORO;  Surgeon: Maryjane Hurter, MD;  Location[facility]COPY;  Service: Pulmonary;  Laterality: Right;    Current Medications: Current Meds  Medication Sig   ampicillin (PRINCIPEN) 500 MG capsule Take 500 mg by  mouth daily.   aspirin EC 81 MG tablet Take 81 mg by mouth daily.   azithromycin (ZITHROMAX) 500 MG tablet Take 1 tablet (500 mg total) by mouth 3 (three) times a week.   benzonatate (TESSALON) 200 MG capsule TAKE 1 CAPSULE (200 MG TOTAL) BY MOUTH 3 (THREE) TIMES DAILY AS NEEDED FOR COUGH.   cyproheptadine (PERIACTIN) 4 MG tablet Take 4 mg by mouth daily.   ethambutol (MYAMBUTOL) 400 MG tablet Take 5 tablets (2,000 mg total) by mouth 3 (three) times a week.   finasteride (PROSCAR) 5 MG tablet Take 1 mg by mouth daily. Cut in 4 pieces   hydrochlorothiazide (HYDRODIURIL) 12.5 MG tablet Take 12.5 mg by mouth every morning.   ipratropium (ATROVENT) 0.03 % nasal spray PLACE 2 SPRAYS INTO BOTH NOSTRILS 3 (THREE) TIMES DAILY   Multiple Vitamin (MULTIVITAMIN WITH MINERALS) TABS tablet Take 1 tablet by mouth daily. Reported on 06/28/2015   rifampin (RIFADIN) 300 MG capsule Take 2 capsules (600 mg total) by mouth 3 (three) times a week.     Allergies:    Patient has no known allergies.   Social History: Social History   Socioeconomic History   Marital status: Single    Spouse name: Not on file   Number of children: Not on file   Years of education: Not on file   Highest education level: Not on file  Occupational History   Occupation: Retried   Occupation: Retired [employer]   Smoking status: Former    Packs/day: 0.25    Years: 15.00    Total pack years: 3.75    Types: Cigarettes    Quit date: 10/07/1993    Years since quitting: 28.9   Smokeless tobacco: Never  Vaping Use   Vaping Use: Never used  Substance and Sexual Activity   Alcohol use: Not Currently    Comment: "I quit drinking in ~ 2004"   Drug use: No   Sexual activity: Never  Other Topics Concern   Not on file  Social History Narrative   Not on file   Social Determinants of Health   Financial Resource Strain: Not on file  Food Insecurity: Not on file  Transportation Needs: Not on file  Physical Activity:  Not on file  Stress: Not on file  Social Connections: Not on file     Family History: The patient's family history includes Heart attack in his father and mother.  ROS:   All other ROS reviewed and negative. Pertinent positives noted in the HPI.     EKGs/Labs/Other Studies Reviewed:   The following studies were personally reviewed by me today:   TTE 05/24/2015 - Left ventricle: The cavity size was normal. There was moderate    concentric hypertrophy. Systolic function was normal. The    estimated ejection fraction was in the range of 55% to 60%. Wall  motion was normal; there were no regional wall motion    abnormalities. Doppler parameters are consistent with abnormal    left ventricular relaxation (grade 1 diastolic dysfunction).  - Mitral valve: There was trivial regurgitation.  - Right ventricle: The cavity size was normal. Wall thickness was    normal. Systolic function was normal.  - Tricuspid valve: There was trivial regurgitation.  - Pulmonic valve: There was trivial regurgitation.  - Inferior vena cava: The vessel was normal in size. The    respirophasic diameter changes were in the normal range (>= 50%),    consistent with normal central venous pressure.   Recent Labs: 04/06/2022: ALT 14; BUN 22; Creatinine 1.27; Hemoglobin 15.2; Platelet Count 198; Potassium 4.2; Sodium 140   Recent Lipid Panel    Component Value Date/Time   CHOL 118 10/11/2020 0841   TRIG 52 10/11/2020 0841   HDL 47 10/11/2020 0841   CHOLHDL 2.5 10/11/2020 0841   CHOLHDL 3.3 10/08/2016 1423   VLDL 26 10/08/2016 1423   LDLCALC 59 10/11/2020 0841    Physical Exam:   VS:  BP 118/86   Pulse 68   Ht '6\' 2"'$  (1.88 m)   Wt 195 lb 12.8 oz (88.8 kg)   SpO2 98%   BMI 25.14 kg/m    Wt Readings from Last 3 Encounters:  08/31/22 195 lb 12.8 oz (88.8 kg)  07/25/22 199 lb (90.3 kg)  05/24/22 199 lb 3.2 oz (90.4 kg)    General: Well nourished, well developed, in no acute distress Head:  Atraumatic, normal size  Eyes: PEERLA, EOMI  Neck: Supple, no JVD Endocrine: No thryomegaly Cardiac: Normal S1, S2; RRR; no murmurs, rubs, or gallops Lungs: Clear to auscultation bilaterally, no wheezing, rhonchi or rales  Abd: Soft, nontender, no hepatomegaly  Ext: No edema, pulses 2+ Musculoskeletal: No deformities, BUE and BLE strength normal and equal Skin: Warm and dry, no rashes   Neuro: Alert and oriented to person, place, time, and situation, CNII-XII grossly intact, no focal deficits  Psych: Normal mood and affect   ASSESSMENT:   Randall Bradley is a 67 y.o. male who presents for the following: 1. Agatston coronary artery calcium score between 200 and 399   2. Mixed hyperlipidemia   3. Family history of heart disease     PLAN:   1. Agatston coronary artery calcium score between 200 and 399 2. Mixed hyperlipidemia 3. Family history of heart disease -Elevated calcium score.  LDL cholesterol 59.  Not on a statin.  Likely okay for now.  He is on aspirin 81 mg daily.  No symptoms of angina.  Getting all of his exercise as well as diet goals.  He will see [place] yearly  Disposition: Return in about 1 year (around 08/31/2023).  Medication Adjustments/Labs and Tests Ordered: Current medicines are reviewed at length with the patient today.  Concerns regarding medicines are outlined above.  No orders of the defined types were placed in this encounter.  No orders of the defined types were placed in this encounter.   Patient Instructions  Medication Instructions:  The current medical regimen is effective;  continue present plan and medications.  *If you need a refill on your cardiac medications before your next appointment, please call your pharmacy*   Follow-Up: At [facility], you and your health needs are our priority.  As part of our continuing mission to provide you with exceptional heart care, we have created designated Provider Care Teams.  These Care Teams  include  your primary Cardiologist (physician) and Advanced Practice Providers (APPs -  Physician Assistants and Nurse Practitioners) who all work together to provide you with the care you need, when you need it.  We recommend signing up for the patient portal called "MyChart".  Sign up information is provided on this After Visit Summary.  MyChart is used to connect with patients for Virtual Visits (Telemedicine).  Patients are able to view lab/test results, encounter notes, upcoming appointments, etc.  Non-urgent messages can be sent to your provider as well.   To learn more about what you can do with MyChart, go to [email or web address].    Your next appointment:   12 month(s)  Provider:   Eleonore Chiquito, MD   Time Spent with Patient: I have spent a total of 25 minutes with patient reviewing hospital notes, telemetry, EKGs, labs and examining the patient as well as establishing an assessment and plan that was discussed with the patient.  > 50% of time was spent in direct patient care.  Signed, Addison Naegeli. Audie Box, MD, [place]  [address], [place], [place] [address] [phone]  08/31/2022 9:07 AM

## 2022-08-31 ENCOUNTER — Ambulatory Visit: Payer: BLUE CROSS/BLUE SHIELD | Attending: Cardiovascular Disease | Admitting: Cardiovascular Disease

## 2022-08-31 ENCOUNTER — Encounter: Payer: Self-pay | Admitting: Cardiovascular Disease

## 2022-08-31 VITALS — BP 118/86 | HR 68 | Ht 74.0 in | Wt 195.8 lb

## 2022-08-31 DIAGNOSIS — Z8249 Family history of ischemic heart disease and other diseases of the circulatory system: Secondary | ICD-10-CM

## 2022-08-31 DIAGNOSIS — E782 Mixed hyperlipidemia: Secondary | ICD-10-CM

## 2022-08-31 DIAGNOSIS — R931 Abnormal findings on diagnostic imaging of heart and coronary circulation: Secondary | ICD-10-CM

## 2022-08-31 NOTE — Patient Instructions (Signed)
Medication Instructions:  The current medical regimen is effective;  continue present plan and medications.  *If you need a refill on your cardiac medications before your next appointment, please call your pharmacy*   Follow-Up: At [facility], you and your health needs are our priority.  As part of our continuing mission to provide you with exceptional heart care, we have created designated Provider Care Teams.  These Care Teams include your primary Cardiologist (physician) and Advanced Practice Providers (APPs -  Physician Assistants and Nurse Practitioners) who all work together to provide you with the care you need, when you need it.  We recommend signing up for the patient portal called "MyChart".  Sign up information is provided on this After Visit Summary.  MyChart is used to connect with patients for Virtual Visits (Telemedicine).  Patients are able to view lab/test results, encounter notes, upcoming appointments, etc.  Non-urgent messages can be sent to your provider as well.   To learn more about what you can do with MyChart, go to [email or web address].    Your next appointment:   12 month(s)  Provider:   Manzanita O'Neal, MD   

## 2022-09-10 LAB — MYCOBACTERIA,CULT W/FLUOROCHROME SMEAR
MICRO NUMBER:: 14509018
SMEAR:: NONE SEEN
SPECIMEN QUALITY:: ADEQUATE

## 2022-09-19 ENCOUNTER — Ambulatory Visit: Payer: BLUE CROSS/BLUE SHIELD | Admitting: Cardiovascular Disease

## 2022-10-04 ENCOUNTER — Encounter: Payer: Self-pay | Admitting: Hematology & Oncology

## 2022-10-04 ENCOUNTER — Other Ambulatory Visit: Payer: Self-pay

## 2022-10-04 ENCOUNTER — Inpatient Hospital Stay (HOSPITAL_BASED_OUTPATIENT_CLINIC_OR_DEPARTMENT_OTHER): Payer: BLUE CROSS/BLUE SHIELD | Admitting: Hematology & Oncology

## 2022-10-04 ENCOUNTER — Inpatient Hospital Stay: Payer: BLUE CROSS/BLUE SHIELD | Attending: Hematology & Oncology

## 2022-10-04 ENCOUNTER — Encounter: Payer: Self-pay | Admitting: *Deleted

## 2022-10-04 ENCOUNTER — Other Ambulatory Visit: Payer: Self-pay | Admitting: *Deleted

## 2022-10-04 DIAGNOSIS — C61 Malignant neoplasm of prostate: Secondary | ICD-10-CM

## 2022-10-04 DIAGNOSIS — Z79899 Other long term (current) drug therapy: Secondary | ICD-10-CM | POA: Diagnosis not present

## 2022-10-04 LAB — CBC WITH DIFFERENTIAL (CANCER CENTER ONLY)
Abs Immature Granulocytes: 0.01 10*3/uL (ref 0.00–0.07)
Basophils Absolute: 0 10*3/uL (ref 0.0–0.1)
Basophils Relative: 1 %
Eosinophils Absolute: 0.1 10*3/uL (ref 0.0–0.5)
Eosinophils Relative: 2 %
HCT: 46.8 % (ref 39.0–52.0)
Hemoglobin: 15.8 g/dL (ref 13.0–17.0)
Immature Granulocytes: 0 %
Lymphocytes Relative: 33 %
Lymphs Abs: 1.4 10*3/uL (ref 0.7–4.0)
MCH: 30 pg (ref 26.0–34.0)
MCHC: 33.8 g/dL (ref 30.0–36.0)
MCV: 88.8 fL (ref 80.0–100.0)
Monocytes Absolute: 0.5 10*3/uL (ref 0.1–1.0)
Monocytes Relative: 12 %
Neutro Abs: 2.2 10*3/uL (ref 1.7–7.7)
Neutrophils Relative %: 52 %
Platelet Count: 183 10*3/uL (ref 150–400)
RBC: 5.27 MIL/uL (ref 4.22–5.81)
RDW: 12.6 % (ref 11.5–15.5)
WBC Count: 4.2 10*3/uL (ref 4.0–10.5)
nRBC: 0 % (ref 0.0–0.2)

## 2022-10-04 LAB — CMP (CANCER CENTER ONLY)
ALT: 12 U/L (ref 0–44)
AST: 17 U/L (ref 15–41)
Albumin: 4.4 g/dL (ref 3.5–5.0)
Alkaline Phosphatase: 81 U/L (ref 38–126)
Anion gap: 7 (ref 5–15)
BUN: 29 mg/dL — ABNORMAL HIGH (ref 8–23)
CO2: 31 mmol/L (ref 22–32)
Calcium: 9.9 mg/dL (ref 8.9–10.3)
Chloride: 103 mmol/L (ref 98–111)
Creatinine: 1.28 mg/dL — ABNORMAL HIGH (ref 0.61–1.24)
GFR, Estimated: >60 mL/min (ref >60)
Glucose, Bld: 105 mg/dL — ABNORMAL HIGH (ref 70–99)
Potassium: 4 mmol/L (ref 3.5–5.1)
Sodium: 141 mmol/L (ref 135–145)
Total Bilirubin: 0.8 mg/dL (ref 0.3–1.2)
Total Protein: 7 g/dL (ref 6.5–8.1)

## 2022-10-04 LAB — FERRITIN: Ferritin: 20 ng/mL — ABNORMAL LOW (ref 24–336)

## 2022-10-04 LAB — IRON AND IRON BINDING CAPACITY (CC-WL,HP ONLY)
Iron: 209 ug/dL — ABNORMAL HIGH (ref 45–182)
Saturation Ratios: 77 % — ABNORMAL HIGH (ref 17.9–39.5)
TIBC: 270 ug/dL (ref 250–450)
UIBC: 61 ug/dL — ABNORMAL LOW (ref 117–376)

## 2022-10-04 NOTE — Progress Notes (Signed)
Hematology and Oncology Follow Up Visit  Randall Bradley [id number] 1956/04/30 67 y.o. 10/04/2022   Principle Diagnosis:  Hereditary hemochromatosis (C282Y Homozygous)  Current Therapy:   Phlebotomy/blood donation to maintain ferritin less than 50 and iron saturation less than 30%     Interim History:  Randall Bradley is back for follow-up.  He is doing much better.  He is not have any problems with a cough.  Over last saw him, he had a MAI.  When we last saw him, his ferritin was 26 with an iron saturation of 69%.  He got phlebotomized back in October.  Again, he can certainly donate blood to the [facility].  I am unsure if they would take the blood because the history of MAI.  From my point of view, having hemochromatosis really should not be a problem.  He did buy some property up in [place].  I am sure that he is looking forward to building a house up there.  He will be going to [place] this weekend.  He is busy at work.  He has had no fever.  He has had no probably COVID.  There is no leg swelling.  Overall, I was his performance status is probably ECOG 0.    Medications:  Current Outpatient Medications:    ampicillin (PRINCIPEN) 500 MG capsule, Take 500 mg by mouth daily., Disp: , Rfl:    aspirin EC 81 MG tablet, Take 81 mg by mouth daily., Disp: , Rfl:    azithromycin (ZITHROMAX) 500 MG tablet, Take 1 tablet (500 mg total) by mouth 3 (three) times a week., Disp: 12 tablet, Rfl: 11   benzonatate (TESSALON) 200 MG capsule, TAKE 1 CAPSULE (200 MG TOTAL) BY MOUTH 3 (THREE) TIMES DAILY AS NEEDED FOR COUGH., Disp: 30 capsule, Rfl: 1   cyproheptadine (PERIACTIN) 4 MG tablet, Take 4 mg by mouth daily., Disp: , Rfl:    ethambutol (MYAMBUTOL) 400 MG tablet, Take 5 tablets (2,000 mg total) by mouth 3 (three) times a week., Disp: 60 tablet, Rfl: 11   finasteride (PROSCAR) 5 MG tablet, Take 1 mg by mouth daily. Cut in 4 pieces, Disp: , Rfl:    hydrochlorothiazide (HYDRODIURIL) 12.5 MG  tablet, Take 12.5 mg by mouth every morning., Disp: , Rfl:    ipratropium (ATROVENT) 0.03 % nasal spray, PLACE 2 SPRAYS INTO BOTH NOSTRILS 3 (THREE) TIMES DAILY, Disp: 30 mL, Rfl: 4   Multiple Vitamin (MULTIVITAMIN WITH MINERALS) TABS tablet, Take 1 tablet by mouth daily. Reported on 06/28/2015, Disp: , Rfl:    rifampin (RIFADIN) 300 MG capsule, Take 2 capsules (600 mg total) by mouth 3 (three) times a week., Disp: 24 capsule, Rfl: 11  Allergies:  No Known Allergies    Past Medical History, Surgical history, Social history, and Family History were reviewed and updated.  Review of Systems: Review of Systems  Constitutional: Negative.   HENT:  Negative.    Eyes: Negative.   Respiratory: Negative.    Cardiovascular: Negative.   Gastrointestinal: Negative.   Endocrine: Negative.   Genitourinary: Negative.    Musculoskeletal: Negative.   Skin: Negative.   Neurological: Negative.   Hematological: Negative.   Psychiatric/Behavioral: Negative.      Physical Exam:  height is 6\' 2"  (1.88 m) and weight is 195 lb (88.5 kg). His oral temperature is 97.8 F (36.6 C). His blood pressure is 132/89 and his pulse is 76. His respiration is 16 and oxygen saturation is 100%.   Wt Readings from Last  3 Encounters:  10/04/22 195 lb (88.5 kg)  08/31/22 195 lb 12.8 oz (88.8 kg)  07/25/22 199 lb (90.3 kg)    Physical Exam Vitals reviewed.  HENT:     Head: Normocephalic and atraumatic.  Eyes:     Pupils: Pupils are equal, round, and reactive to light.  Cardiovascular:     Rate and Rhythm: Normal rate and regular rhythm.     Heart sounds: Normal heart sounds.  Pulmonary:     Effort: Pulmonary effort is normal.     Breath sounds: Normal breath sounds.  Abdominal:     General: Bowel sounds are normal.     Palpations: Abdomen is soft.  Musculoskeletal:        General: No tenderness or deformity. Normal range of motion.     Cervical back: Normal range of motion.  Lymphadenopathy:     Cervical:  No cervical adenopathy.  Skin:    General: Skin is warm and dry.     Findings: No erythema or rash.  Neurological:     Mental Status: He is alert and oriented to person, place, and time.  Psychiatric:        Behavior: Behavior normal.        Thought Content: Thought content normal.        Judgment: Judgment normal.      Lab Results  Component Value Date   WBC 4.2 10/04/2022   HGB 15.8 10/04/2022   HCT 46.8 10/04/2022   MCV 88.8 10/04/2022   PLT 183 10/04/2022     Chemistry      Component Value Date/Time   NA 141 10/04/2022 0902   NA 141 10/11/2020 0841   K 4.0 10/04/2022 0902   CL 103 10/04/2022 0902   CO2 31 10/04/2022 0902   BUN 29 (H) 10/04/2022 0902   BUN 21 10/11/2020 0841   CREATININE 1.28 (H) 10/04/2022 0902   CREATININE 1.05 03/22/2022 1020      Component Value Date/Time   CALCIUM 9.9 10/04/2022 0902   ALKPHOS 81 10/04/2022 0902   AST 17 10/04/2022 0902   ALT 12 10/04/2022 0902   BILITOT 0.8 10/04/2022 0902      Impression and Plan: Randall Bradley is a very nice 67 year old white male.  He has hereditary hemochromatosis.  He has the major mutation.  He is homozygous for this.  We will see what his iron studies look like.  If we need to, we will phlebotomize him.  Overall, I still think we will probably get him back in 6 months.   Josph Macho, MD 4/11/20249:59 AM

## 2022-10-05 ENCOUNTER — Encounter: Payer: Self-pay | Admitting: *Deleted

## 2022-10-05 LAB — PSA, TOTAL AND FREE
PSA, Free Pct: UNDETERMINED %
PSA, Free: <0.02 ng/mL
Prostate Specific Ag, Serum: <0.1 ng/mL (ref 0.0–4.0)

## 2022-10-10 ENCOUNTER — Encounter: Payer: Self-pay | Admitting: Internal Medicine

## 2022-10-10 ENCOUNTER — Ambulatory Visit (INDEPENDENT_AMBULATORY_CARE_PROVIDER_SITE_OTHER): Payer: BLUE CROSS/BLUE SHIELD | Admitting: Internal Medicine

## 2022-10-10 ENCOUNTER — Other Ambulatory Visit: Payer: Self-pay

## 2022-10-10 VITALS — BP 117/86 | HR 99 | Temp 97.3°F | Ht 74.0 in | Wt 196.0 lb

## 2022-10-10 DIAGNOSIS — A310 Pulmonary mycobacterial infection: Secondary | ICD-10-CM

## 2022-10-10 NOTE — Assessment & Plan Note (Signed)
He had prompt and dramatic clinical improvement after starting antibiotics 8 months ago and has become smear and culture negative.  He has also had radiographic improvement.  He will continue his current 3 drug antibiotic regimen and follow-up in 3 months.  I would recommend repeat AFB smears and cultures at that time and continuing therapy at least through the end of September.

## 2022-10-10 NOTE — Progress Notes (Signed)
[facility]  Patient Active Problem List   Diagnosis Date Noted   Mycobacterium avium infection     Priority: High   Chronic cough 05/29/2018    Priority: High   Helicobacter pylori (H. pylori) infection 01/31/2022   Esophageal dysmotility 01/31/2022   H/O radical prostatectomy 01/10/2022   Bilateral carpal tunnel syndrome 07/12/2020   Hemochromatosis, hereditary 03/16/2020   Allergic rhinitis 05/29/2018   Artificial knee joint present 05/29/2018   Asthma without status asthmaticus 05/29/2018   Disorder of iron metabolism 05/29/2018   Encounter for general adult medical examination without abnormal findings 05/29/2018   History of colon polyps 05/29/2018   Inguinal hernia without obstruction or gangrene 05/29/2018   Insomnia due to medical condition 05/29/2018   Malignant neoplasm of prostate 05/29/2018   Osteoarthrosis involving more than one site but not generalized 05/29/2018   Other long term (current) drug therapy 05/29/2018   Other specified extrapyramidal and movement disorders 05/29/2018   Pain in right knee 05/29/2018   Pure hypercholesterolemia 05/29/2018   Dextroscoliosis 08/01/2015   Essential hypertension 06/30/2015   Family history of heart disease 06/30/2015   Left ventricular hypertrophy due to hypertensive disease 06/30/2015   Left ventricular diastolic dysfunction, NYHA class 1 06/30/2015   Fast heart beat 05/11/2015   DJD (degenerative joint disease) of knee 04/27/2015    Patient's Medications  New Prescriptions   No medications on file  Previous Medications   AMPICILLIN (PRINCIPEN) 500 MG CAPSULE    Take 500 mg by mouth daily.   ASPIRIN EC 81 MG TABLET    Take 81 mg by mouth daily.   AZITHROMYCIN (ZITHROMAX) 500 MG TABLET    Take 1 tablet (500 mg total) by mouth 3 (three) times a week.   BENZONATATE (TESSALON) 200 MG CAPSULE    TAKE 1 CAPSULE (200 MG TOTAL) BY MOUTH 3 (THREE) TIMES DAILY AS NEEDED FOR COUGH.    CYPROHEPTADINE (PERIACTIN) 4 MG TABLET    Take 4 mg by mouth daily.   ETHAMBUTOL (MYAMBUTOL) 400 MG TABLET    Take 5 tablets (2,000 mg total) by mouth 3 (three) times a week.   FINASTERIDE (PROSCAR) 5 MG TABLET    Take 1 mg by mouth daily. Cut in 4 pieces   HYDROCHLOROTHIAZIDE (HYDRODIURIL) 12.5 MG TABLET    Take 12.5 mg by mouth every morning.   IPRATROPIUM (ATROVENT) 0.03 % NASAL SPRAY    PLACE 2 SPRAYS INTO BOTH NOSTRILS 3 (THREE) TIMES DAILY   MULTIPLE VITAMIN (MULTIVITAMIN WITH MINERALS) TABS TABLET    Take 1 tablet by mouth daily. Reported on 06/28/2015   RIFAMPIN (RIFADIN) 300 MG CAPSULE    Take 2 capsules (600 mg total) by mouth 3 (three) times a week.  Modified Medications   No medications on file  Discontinued Medications   No medications on file    Subjective: Randall Bradley is in for his routine follow-up for Mycobacterium AVM pneumonia.  He started on azithromycin, ethambutol and rifampin on 02/23/22.  Follow-up AFB stains and cultures were negative on 03/23/2022 and again on 07/26/2022.  Repeat chest CT scan on 04/18/2022 showed improvement in his infiltrate and tree-in-bud nodules.  His cough is much improved since starting antibiotics.  He feels the minor cough he has now is due to allergies and postnasal drip.  He is not having any shortness of breath.  He continues to tolerate his antibiotics well.  Review of Systems: Review of Systems  Constitutional:  Negative for fever and weight loss.  Respiratory:  Positive for cough and sputum production. Negative for hemoptysis and shortness of breath.   Cardiovascular:  Negative for chest pain.    Past Medical History:  Diagnosis Date   Alopecia    Arthritis    finger   Bilateral carpal tunnel syndrome 07/12/2020   COVID 03/2021   mild   GERD (gastroesophageal reflux disease)    mild   Hematuria    Hemochromatosis    Hemochromatosis, hereditary 03/16/2020   Hereditary hemochromatosis monitored by PCP  dr Molly Maduro gates   history of  phlebotomies -- last one 2014 (pt states body has normalized)   History of bladder stone    History of kidney stones    Hypertension    no medications at this time.   Prostate cancer    UROLOGIST-- DR Retta Diones; S/P PROSTATECTOMY 03-17-2008   Right ureteral stone    Rosacea     Social History   Tobacco Use   Smoking status: Former    Packs/day: 0.25    Years: 15.00    Additional pack years: 0.00    Total pack years: 3.75    Types: Cigarettes    Quit date: 10/07/1993    Years since quitting: 29.0   Smokeless tobacco: Never  Vaping Use   Vaping Use: Never used  Substance Use Topics   Alcohol use: Not Currently    Comment: "I quit drinking in ~ 2004"   Drug use: No    Family History  Problem Relation Age of Onset   Heart attack Mother    Heart attack Father     No Known Allergies  Objective: Vitals:   10/10/22 1002  BP: 117/86  Pulse: 99  Temp: (!) 97.3 F (36.3 C)  TempSrc: Temporal  SpO2: 98%  Weight: 196 lb (88.9 kg)  Height:  (1.88 m)   Body mass index is 25.16 kg/m.  Physical Exam Constitutional:      Comments: He is in good spirits and happy to be starting to build a house near [place] in the coming week.  Cardiovascular:     Rate and Rhythm: Normal rate.  Pulmonary:     Effort: Pulmonary effort is normal.  Psychiatric:        Mood and Affect: Mood normal.     Lab Results CMP     Component Value Date/Time   NA 141 10/04/2022 0902   NA 141 10/11/2020 0841   K 4.0 10/04/2022 0902   CL 103 10/04/2022 0902   CO2 31 10/04/2022 0902   GLUCOSE 105 (H) 10/04/2022 0902   BUN 29 (H) 10/04/2022 0902   BUN 21 10/11/2020 0841   CREATININE 1.28 (H) 10/04/2022 0902   CREATININE 1.05 03/22/2022 1020   CALCIUM 9.9 10/04/2022 0902   PROT 7.0 10/04/2022 0902   PROT 6.7 10/11/2020 0841   ALBUMIN 4.4 10/04/2022 0902   ALBUMIN 4.4 10/11/2020 0841   AST 17 10/04/2022 0902   ALT 12 10/04/2022 0902   ALKPHOS 81 10/04/2022 0902   BILITOT 0.8  10/04/2022 0902   GFRNONAA >60 10/04/2022 0902   GFRAA >60 03/16/2020 1428   Lab Results  Component Value Date   WBC 4.2 10/04/2022   HGB 15.8 10/04/2022   HCT 46.8 10/04/2022   MCV 88.8 10/04/2022   PLT 183 10/04/2022     Problem List Items Addressed This Visit       High   Mycobacterium avium infection - Primary  He had prompt and dramatic clinical improvement after starting antibiotics 8 months ago and has become smear and culture negative.  He has also had radiographic improvement.  He will continue his current 3 drug antibiotic regimen and follow-up in 3 months.  I would recommend repeat AFB smears and cultures at that time and continuing therapy at least through the end of September.        Cliffton Asters, MD [facility] [phone] pager   [phone] cell 10/10/2022, 10:21 AM

## 2023-01-09 ENCOUNTER — Ambulatory Visit: Payer: BLUE CROSS/BLUE SHIELD | Admitting: Internal Medicine

## 2023-01-24 ENCOUNTER — Telehealth: Payer: Self-pay

## 2023-01-24 NOTE — Telephone Encounter (Signed)
Received refills requests for azithromycin and rifampin, patient is due for appointment. Called to schedule, no answer. Left HIPAA compliant voicemail requesting callback.   Sandie Ano, RN

## 2023-01-28 ENCOUNTER — Telehealth: Payer: Self-pay

## 2023-01-28 NOTE — Telephone Encounter (Signed)
Received refill request for Ethambutal. Patient is due for follow up appointment. Will need to schedule appt with a different provider in order to receive refills.  Juanita Laster, RMA

## 2023-01-28 NOTE — Telephone Encounter (Signed)
Rcvd Rifampin and Azithromycin refill request via fax from [facility] advising patient to pls call [phone] to get an appointment with another provider as Dr.Hatcher is no longer a provider at this clinic.

## 2023-01-31 ENCOUNTER — Telehealth: Payer: Self-pay

## 2023-01-31 NOTE — Telephone Encounter (Signed)
Left patient a voice mail to call back to reschedule missed office visit with Dr. Renold Don

## 2023-04-05 ENCOUNTER — Inpatient Hospital Stay: Payer: BLUE CROSS/BLUE SHIELD | Admitting: Hematology & Oncology

## 2023-04-05 ENCOUNTER — Encounter: Payer: Self-pay | Admitting: Hematology & Oncology

## 2023-04-05 ENCOUNTER — Inpatient Hospital Stay: Payer: BLUE CROSS/BLUE SHIELD | Attending: Hematology & Oncology

## 2023-04-05 ENCOUNTER — Inpatient Hospital Stay: Payer: BLUE CROSS/BLUE SHIELD

## 2023-04-05 DIAGNOSIS — Z79899 Other long term (current) drug therapy: Secondary | ICD-10-CM | POA: Insufficient documentation

## 2023-04-05 LAB — CMP (CANCER CENTER ONLY)
ALT: 10 U/L (ref 0–44)
AST: 15 U/L (ref 15–41)
Albumin: 4.1 g/dL (ref 3.5–5.0)
Alkaline Phosphatase: 65 U/L (ref 38–126)
Anion gap: 7 (ref 5–15)
BUN: 23 mg/dL (ref 8–23)
CO2: 30 mmol/L (ref 22–32)
Calcium: 9.2 mg/dL (ref 8.9–10.3)
Chloride: 104 mmol/L (ref 98–111)
Creatinine: 1.17 mg/dL (ref 0.61–1.24)
GFR, Estimated: >60 mL/min (ref >60)
Glucose, Bld: 94 mg/dL (ref 70–99)
Potassium: 4.7 mmol/L (ref 3.5–5.1)
Sodium: 141 mmol/L (ref 135–145)
Total Bilirubin: 0.5 mg/dL (ref 0.3–1.2)
Total Protein: 6.5 g/dL (ref 6.5–8.1)

## 2023-04-05 LAB — CBC WITH DIFFERENTIAL (CANCER CENTER ONLY)
Abs Immature Granulocytes: 0.01 10*3/uL (ref 0.00–0.07)
Basophils Absolute: 0 10*3/uL (ref 0.0–0.1)
Basophils Relative: 1 %
Eosinophils Absolute: 0.1 10*3/uL (ref 0.0–0.5)
Eosinophils Relative: 2 %
HCT: 45.5 % (ref 39.0–52.0)
Hemoglobin: 15.7 g/dL (ref 13.0–17.0)
Immature Granulocytes: 0 %
Lymphocytes Relative: 31 %
Lymphs Abs: 1.4 10*3/uL (ref 0.7–4.0)
MCH: 30.1 pg (ref 26.0–34.0)
MCHC: 34.5 g/dL (ref 30.0–36.0)
MCV: 87.3 fL (ref 80.0–100.0)
Monocytes Absolute: 0.5 10*3/uL (ref 0.1–1.0)
Monocytes Relative: 12 %
Neutro Abs: 2.4 10*3/uL (ref 1.7–7.7)
Neutrophils Relative %: 54 %
Platelet Count: 165 10*3/uL (ref 150–400)
RBC: 5.21 MIL/uL (ref 4.22–5.81)
RDW: 12 % (ref 11.5–15.5)
WBC Count: 4.5 10*3/uL (ref 4.0–10.5)
nRBC: 0 % (ref 0.0–0.2)

## 2023-04-05 LAB — IRON AND IRON BINDING CAPACITY (CC-WL,HP ONLY)
Iron: 170 ug/dL (ref 45–182)
Saturation Ratios: 59 % — ABNORMAL HIGH (ref 17.9–39.5)
TIBC: 287 ug/dL (ref 250–450)
UIBC: 117 ug/dL (ref 117–376)

## 2023-04-05 LAB — FERRITIN: Ferritin: 33 ng/mL (ref 24–336)

## 2023-04-05 NOTE — Patient Instructions (Signed)

## 2023-04-05 NOTE — Progress Notes (Signed)
BP remains elevated, 136/90, instructed to monitor at home and contact  PCP if remains over 140/90. Verbalized understanding.

## 2023-04-05 NOTE — Progress Notes (Signed)
Hematology and Oncology Follow Up Visit  Randall Bradley [id number] Jul 12, 1955 67 y.o. 04/05/2023   Principle Diagnosis:  Hereditary hemochromatosis (C282Y Homozygous)  Current Therapy:   Phlebotomy/blood donation to maintain ferritin less than 50 and iron saturation less than 30%     Interim History:  Randall Bradley is back for follow-up.  We last 6 months ago.  He has been quite busy.  He has a house up in [place] and that is being built.  He is up there all the time.  He said when he left of there, he was 31 degrees.  When we last saw him, his ferritin was 20 with an iron saturation of 77%.  He has had no problems with cough or shortness of breath.  He has had no nausea or vomiting.  He has had no change in bowel or bladder habits.  He has had no issues with COVID.  Overall, I would have said that his performance status is probably ECOG 0.     Medications:  Current Outpatient Medications:    ampicillin (PRINCIPEN) 500 MG capsule, Take 500 mg by mouth daily., Disp: , Rfl:    aspirin EC 81 MG tablet, Take 81 mg by mouth daily., Disp: , Rfl:    finasteride (PROSCAR) 5 MG tablet, Take 1 mg by mouth daily. Cut in 4 pieces, Disp: , Rfl:    hydrochlorothiazide (HYDRODIURIL) 12.5 MG tablet, Take 12.5 mg by mouth every morning., Disp: , Rfl:    Multiple Vitamin (MULTIVITAMIN WITH MINERALS) TABS tablet, Take 1 tablet by mouth daily. Reported on 06/28/2015, Disp: , Rfl:    benzonatate (TESSALON) 200 MG capsule, TAKE 1 CAPSULE (200 MG TOTAL) BY MOUTH 3 (THREE) TIMES DAILY AS NEEDED FOR COUGH. (Patient not taking: Reported on 04/05/2023), Disp: 30 capsule, Rfl: 1   ipratropium (ATROVENT) 0.03 % nasal spray, PLACE 2 SPRAYS INTO BOTH NOSTRILS 3 (THREE) TIMES DAILY (Patient not taking: Reported on 04/05/2023), Disp: 30 mL, Rfl: 4   triamcinolone cream (KENALOG) 0.5 %, APPLY SPARINGLY TO AFFECTED AREA EVERY 12 HOURS (Patient not taking: Reported on 04/05/2023), Disp: , Rfl:   Allergies:  No Known  Allergies    Past Medical History, Surgical history, Social history, and Family History were reviewed and updated.  Review of Systems: Review of Systems  Constitutional: Negative.   HENT:  Negative.    Eyes: Negative.   Respiratory: Negative.    Cardiovascular: Negative.   Gastrointestinal: Negative.   Endocrine: Negative.   Genitourinary: Negative.    Musculoskeletal: Negative.   Skin: Negative.   Neurological: Negative.   Hematological: Negative.   Psychiatric/Behavioral: Negative.      Physical Exam:  height is 6\' 2"  (1.88 m) and weight is 194 lb (88 kg). His oral temperature is 97.7 F (36.5 C). His respiration is 64 (abnormal) and oxygen saturation is 99%.   Wt Readings from Last 3 Encounters:  04/05/23 194 lb (88 kg)  10/10/22 196 lb (88.9 kg)  10/04/22 195 lb (88.5 kg)    Physical Exam Vitals reviewed.  HENT:     Head: Normocephalic and atraumatic.  Eyes:     Pupils: Pupils are equal, round, and reactive to light.  Cardiovascular:     Rate and Rhythm: Normal rate and regular rhythm.     Heart sounds: Normal heart sounds.  Pulmonary:     Effort: Pulmonary effort is normal.     Breath sounds: Normal breath sounds.  Abdominal:     General: Bowel sounds are normal.  Palpations: Abdomen is soft.  Musculoskeletal:        General: No tenderness or deformity. Normal range of motion.     Cervical back: Normal range of motion.  Lymphadenopathy:     Cervical: No cervical adenopathy.  Skin:    General: Skin is warm and dry.     Findings: No erythema or rash.  Neurological:     Mental Status: He is alert and oriented to person, place, and time.  Psychiatric:        Behavior: Behavior normal.        Thought Content: Thought content normal.        Judgment: Judgment normal.      Lab Results  Component Value Date   WBC 4.5 04/05/2023   HGB 15.7 04/05/2023   HCT 45.5 04/05/2023   MCV 87.3 04/05/2023   PLT 165 04/05/2023     Chemistry      Component  Value Date/Time   NA 141 10/04/2022 0902   NA 141 10/11/2020 0841   K 4.0 10/04/2022 0902   CL 103 10/04/2022 0902   CO2 31 10/04/2022 0902   BUN 29 (H) 10/04/2022 0902   BUN 21 10/11/2020 0841   CREATININE 1.28 (H) 10/04/2022 0902   CREATININE 1.05 03/22/2022 1020      Component Value Date/Time   CALCIUM 9.9 10/04/2022 0902   ALKPHOS 81 10/04/2022 0902   AST 17 10/04/2022 0902   ALT 12 10/04/2022 0902   BILITOT 0.8 10/04/2022 0902      Impression and Plan: Randall Bradley is a very nice 67 year old white male.  He has hereditary hemochromatosis.  He has the major mutation.  He is homozygous for this.    Since he comes along way in, we will go ahead and phlebotomize him today.  He certainly can be phlebotomized with a hemoglobin of 15.7.  Overall, I still think he is doing quite nicely.  Will plan to get back in 6 more months.  Hopefully, by then, his house will be done.  Marland Kitchen   Josph Macho, MD 10/11/20249:16 AM

## 2023-04-05 NOTE — Progress Notes (Signed)
Randall Bradley presents today for phlebotomy per MD orders. Phlebotomy procedure started at 0950 and ended at 0954. 557 grams removed from rt [facility] using 16g phlebotomy kit.  Patient observed for 30 minutes after procedure without any incident. Patient tolerated procedure well. IV needle removed intact.

## 2023-04-25 ENCOUNTER — Ambulatory Visit: Payer: Medicare Other | Admitting: Allergy

## 2023-05-13 ENCOUNTER — Ambulatory Visit: Payer: BLUE CROSS/BLUE SHIELD | Admitting: Internal Medicine

## 2023-06-12 ENCOUNTER — Ambulatory Visit: Payer: BLUE CROSS/BLUE SHIELD | Admitting: Internal Medicine

## 2023-08-12 ENCOUNTER — Other Ambulatory Visit: Payer: Self-pay | Admitting: Podiatry

## 2023-08-12 ENCOUNTER — Encounter: Payer: Self-pay | Admitting: Hematology & Oncology

## 2023-08-12 ENCOUNTER — Ambulatory Visit (INDEPENDENT_AMBULATORY_CARE_PROVIDER_SITE_OTHER): Payer: BLUE CROSS/BLUE SHIELD

## 2023-08-12 ENCOUNTER — Ambulatory Visit (INDEPENDENT_AMBULATORY_CARE_PROVIDER_SITE_OTHER): Payer: BLUE CROSS/BLUE SHIELD | Admitting: Podiatry

## 2023-08-12 ENCOUNTER — Encounter (HOSPITAL_COMMUNITY): Payer: Self-pay | Admitting: *Deleted

## 2023-08-12 ENCOUNTER — Encounter: Payer: Self-pay | Admitting: Podiatry

## 2023-08-12 DIAGNOSIS — M778 Other enthesopathies, not elsewhere classified: Secondary | ICD-10-CM

## 2023-08-12 DIAGNOSIS — M79671 Pain in right foot: Secondary | ICD-10-CM | POA: Diagnosis not present

## 2023-08-12 DIAGNOSIS — M79672 Pain in left foot: Secondary | ICD-10-CM | POA: Diagnosis not present

## 2023-08-12 DIAGNOSIS — L539 Erythematous condition, unspecified: Secondary | ICD-10-CM

## 2023-08-12 DIAGNOSIS — B351 Tinea unguium: Secondary | ICD-10-CM | POA: Diagnosis not present

## 2023-08-12 DIAGNOSIS — Q667 Congenital pes cavus, unspecified foot: Secondary | ICD-10-CM | POA: Diagnosis not present

## 2023-08-12 DIAGNOSIS — M7750 Other enthesopathy of unspecified foot: Secondary | ICD-10-CM

## 2023-08-12 MED ORDER — EFINACONAZOLE 10 % EX SOLN: 1.0000 [drp] | Freq: Every day | CUTANEOUS | 11 refills | Status: DC

## 2023-08-12 NOTE — Progress Notes (Signed)
Subjective:   Patient ID: Randall Bradley, male   DOB: 68 y.o.   MRN: [id number]   HPI Chief Complaint  Patient presents with   Foot Pain    RM#13 Bilateral foot pain for over a year  resting feet is the only relief he gets. Feels like muscle are pulling on the back of left heel.Patient states he feels like he is walking on the outside of feet.    68 year old male presents the office with above concerns.  States he been having pain for over a year.  He feels that this point in the back of his heel also he is walking the outside aspect of his feet.  Fusion and neuroma excision he feels the symptoms may be coming back but the symptoms are intermittent.  No recent injuries that he reports.     He has other concerns about possible nail fungus.  No recent treatment no pain to the toenails.    Also concerned about his right second toenail becoming red as he wears certain boots when he works and abruptly gets irritated, inflamed.  No open lesions or any drainage.  ROS  Past Medical History:  Diagnosis Date   Alopecia    Arthritis    finger   Bilateral carpal tunnel syndrome 07/12/2020   COVID 03/2021   mild   GERD (gastroesophageal reflux disease)    mild   Hematuria    Hemochromatosis    Hemochromatosis, hereditary (HCC) 03/16/2020   Hereditary hemochromatosis (HCC) monitored by PCP  dr Molly Maduro gates   history of phlebotomies -- last one 2014 (pt states body has normalized)   History of bladder stone    History of kidney stones    Hypertension    no medications at this time.   Prostate cancer (HCC)    UROLOGIST-- DR Retta Diones; S/P PROSTATECTOMY 03-17-2008   Right ureteral stone    Rosacea     Past Surgical History:  Procedure Laterality Date   APPENDECTOMY  06/25/1961   BRONCHIAL WASHINGS  01/05/2022   Procedure: BRONCHIAL WASHINGS;  Surgeon: Omar Person, MD;  Location: [facility] ENDOSCOPY;  Service: Pulmonary;;   COLONOSCOPY     COMPLEX WOUND CLOSURE Left 03/26/2009    INDEX FINGER COMPLEX WOUND LACERATION REPAIR   CYSTOLITHOLAPAXY AND URETHRAL DILATION  10/05/2009   CYSTOSCOPY W/ RETROGRADES Right 10/11/2014   Procedure: CYSTOSCOPY WITH RETROGRADE PYELOGRAM;  Surgeon: Marcine Matar, MD;  Location: [facility];  Service: Urology;  Laterality: Right;   HOLMIUM LASER APPLICATION N/A 10/11/2014   Procedure: HOLMIUM LASER LITHOTRIPSY, ;  Surgeon: Marcine Matar, MD;  Location: [facility];  Service: Urology;  Laterality: N/A;   JOINT REPLACEMENT     KNEE ARTHROSCOPY Left 06/25/1993   LIPOMA EXCISION  03/04/2000   forehead   ROBOT ASSISTED LAPAROSCOPIC RADICAL PROSTATECTOMY  03/17/2008   w/  BILATERAL PELVIC LYMPHADENECTOMY (NON-NERVE SPARING)   TOTAL KNEE ARTHROPLASTY Left 04/27/2015   Procedure: TOTAL KNEE ARTHROPLASTY;  Surgeon: Loreta Ave, MD;  Location: [facility] OR;  Service: Orthopedics;  Laterality: Left;   TOTAL KNEE ARTHROPLASTY Right 06/08/2015   TOTAL KNEE ARTHROPLASTY Right 06/08/2015   Procedure: TOTAL RIGHT KNEE ARTHROPLASTY;  Surgeon: Loreta Ave, MD;  Location: [facility] OR;  Service: Orthopedics;  Laterality: Right;   VIDEO BRONCHOSCOPY Right 01/05/2022   Procedure: VIDEO BRONCHOSCOPY WITHOUT FLUORO;  Surgeon: Omar Person, MD;  Location: [facility] ENDOSCOPY;  Service: Pulmonary;  Laterality: Right;     Current Outpatient Medications:  aspirin EC 81 MG tablet, Take 81 mg by mouth daily., Disp: , Rfl:    hydrochlorothiazide (HYDRODIURIL) 12.5 MG tablet, Take 12.5 mg by mouth every morning., Disp: , Rfl:    Multiple Vitamin (MULTIVITAMIN WITH MINERALS) TABS tablet, Take 1 tablet by mouth daily. Reported on 06/28/2015, Disp: , Rfl:    ampicillin (PRINCIPEN) 500 MG capsule, Take 500 mg by mouth daily. (Patient not taking: Reported on 08/12/2023), Disp: , Rfl:    benzonatate (TESSALON) 200 MG capsule, TAKE 1 CAPSULE (200 MG TOTAL) BY MOUTH 3 (THREE) TIMES DAILY AS NEEDED FOR COUGH. (Patient not taking: Reported on  08/12/2023), Disp: 30 capsule, Rfl: 1   finasteride (PROSCAR) 5 MG tablet, Take 1 mg by mouth daily. Cut in 4 pieces (Patient not taking: Reported on 08/12/2023), Disp: , Rfl:    ipratropium (ATROVENT) 0.03 % nasal spray, PLACE 2 SPRAYS INTO BOTH NOSTRILS 3 (THREE) TIMES DAILY (Patient not taking: Reported on 08/12/2023), Disp: 30 mL, Rfl: 4   triamcinolone cream (KENALOG) 0.5 %, , Disp: , Rfl:   No Known Allergies       Objective:  Physical Exam  General: AAO x3, NAD  Dermatological: Nails have mild discoloration.  There is no edema, erythema or signs of infection.  There is localized edema and erythema to the distal portion of the second toe which I think is more from inflammation.  No drainage or pus coming from the toenail.  No significant incurvation of the toenail noted.  Vascular: Dorsalis Pedis artery and Posterior Tibial artery pedal pulses are 2/4 bilateral with immedate capillary fill time. There is no pain with calf compression, swelling, warmth, erythema.   Neruologic: Grossly intact via light touch bilateral.   Musculoskeletal: Nonweightbearing evaluation he has supinated, cavus foot type.  He gets tenderness palpation on the course the peroneal tendon just posterior to the lateral malleolus on the right side.  Unable to appreciate any area pinpoint tenderness.  She has a pulling sensation on the back of his heels but Achilles tendon appears to be intact.  On the area the previous neuroma on the left foot there are some mild discomfort and a small palpable neuromas identified today.  No significant pain on exam.      Assessment:   68 year old male cavus foot type, peroneal tendinitis, neuroma; onychomycosis; right second toe redness     Plan:  -Treatment options discussed including all alternatives, risks, and complications -Etiology of symptoms were discussed -X-rays were obtained and reviewed with the patient.  3 views bilateral feet obtained.  No evidence of acute  fracture.  There is.  Decreased joint space on the subtalar joint.  Tendonitis/Cavus foot -Discussed topical Voltaren salicylate off on oral medications.  Patient daily.  We discussed stretching, rehab exercises daily.  Do think that custom orthotics and was measured for inserts today to help decrease pain and help get better support, stability.  Onychomycosis -[place]ublia  Right second toe erythema -Likely more from irritation, inflammation.  Discussed offloading to avoid excess pressure on the toe.  He can use a toe cap.  Monitor for any signs or symptoms of infection.    Vivi Barrack DPM

## 2023-08-12 NOTE — Patient Instructions (Signed)

## 2023-08-13 ENCOUNTER — Other Ambulatory Visit: Payer: Self-pay | Admitting: Podiatry

## 2023-08-13 MED ORDER — EFINACONAZOLE 10 % EX SOLN: 1.0000 [drp] | Freq: Every day | CUTANEOUS | 11 refills | Status: DC

## 2023-09-17 ENCOUNTER — Encounter (HOSPITAL_COMMUNITY): Payer: Self-pay | Admitting: *Deleted

## 2023-09-17 ENCOUNTER — Encounter: Payer: Self-pay | Admitting: Hematology & Oncology

## 2023-09-20 ENCOUNTER — Telehealth: Payer: Self-pay

## 2023-09-20 NOTE — Telephone Encounter (Signed)
 LEFT VM TO SCHEDULE ORTHOTIC PICK UP

## 2023-10-03 ENCOUNTER — Encounter: Payer: BLUE CROSS/BLUE SHIELD | Admitting: Internal Medicine

## 2023-10-04 ENCOUNTER — Other Ambulatory Visit: Payer: BLUE CROSS/BLUE SHIELD

## 2023-10-04 ENCOUNTER — Inpatient Hospital Stay: Payer: BLUE CROSS/BLUE SHIELD

## 2023-10-04 ENCOUNTER — Ambulatory Visit: Payer: BLUE CROSS/BLUE SHIELD | Admitting: Hematology & Oncology

## 2023-10-07 ENCOUNTER — Encounter: Payer: Self-pay | Admitting: Hematology & Oncology

## 2023-10-07 ENCOUNTER — Inpatient Hospital Stay: Admitting: Hematology & Oncology

## 2023-10-07 ENCOUNTER — Inpatient Hospital Stay: Attending: Hematology & Oncology

## 2023-10-07 ENCOUNTER — Other Ambulatory Visit: Payer: Self-pay

## 2023-10-07 ENCOUNTER — Inpatient Hospital Stay

## 2023-10-07 DIAGNOSIS — Z79899 Other long term (current) drug therapy: Secondary | ICD-10-CM | POA: Diagnosis not present

## 2023-10-07 LAB — CBC WITH DIFFERENTIAL (CANCER CENTER ONLY)
Abs Immature Granulocytes: 0.01 10*3/uL (ref 0.00–0.07)
Basophils Absolute: 0 10*3/uL (ref 0.0–0.1)
Basophils Relative: 1 %
Eosinophils Absolute: 0.1 10*3/uL (ref 0.0–0.5)
Eosinophils Relative: 2 %
HCT: 45.6 % (ref 39.0–52.0)
Hemoglobin: 15.4 g/dL (ref 13.0–17.0)
Immature Granulocytes: 0 %
Lymphocytes Relative: 38 %
Lymphs Abs: 1.6 10*3/uL (ref 0.7–4.0)
MCH: 29.6 pg (ref 26.0–34.0)
MCHC: 33.8 g/dL (ref 30.0–36.0)
MCV: 87.7 fL (ref 80.0–100.0)
Monocytes Absolute: 0.6 10*3/uL (ref 0.1–1.0)
Monocytes Relative: 13 %
Neutro Abs: 2 10*3/uL (ref 1.7–7.7)
Neutrophils Relative %: 46 %
Platelet Count: 191 10*3/uL (ref 150–400)
RBC: 5.2 MIL/uL (ref 4.22–5.81)
RDW: 12.6 % (ref 11.5–15.5)
WBC Count: 4.3 10*3/uL (ref 4.0–10.5)
nRBC: 0 % (ref 0.0–0.2)

## 2023-10-07 LAB — CMP (CANCER CENTER ONLY)
ALT: 14 U/L (ref 0–44)
AST: 18 U/L (ref 15–41)
Albumin: 4.4 g/dL (ref 3.5–5.0)
Alkaline Phosphatase: 71 U/L (ref 38–126)
Anion gap: 8 (ref 5–15)
BUN: 23 mg/dL (ref 8–23)
CO2: 31 mmol/L (ref 22–32)
Calcium: 9.4 mg/dL (ref 8.9–10.3)
Chloride: 104 mmol/L (ref 98–111)
Creatinine: 1.36 mg/dL — ABNORMAL HIGH (ref 0.61–1.24)
GFR, Estimated: 56 mL/min — ABNORMAL LOW (ref >60)
Glucose, Bld: 63 mg/dL — ABNORMAL LOW (ref 70–99)
Potassium: 4 mmol/L (ref 3.5–5.1)
Sodium: 143 mmol/L (ref 135–145)
Total Bilirubin: 0.5 mg/dL (ref 0.0–1.2)
Total Protein: 6.7 g/dL (ref 6.5–8.1)

## 2023-10-07 LAB — FERRITIN: Ferritin: 22 ng/mL — ABNORMAL LOW (ref 24–336)

## 2023-10-07 NOTE — Progress Notes (Signed)
 Hematology and Oncology Follow Up Visit  Randall Bradley [id number] 1955/11/18 68 y.o. 10/07/2023   Principle Diagnosis:  Hereditary hemochromatosis (C282Y Homozygous)  Current Therapy:   Phlebotomy/blood donation to maintain ferritin less than 50 and iron saturation less than 30%     Interim History:  Randall Bradley is back for follow-up.  We last 6 months ago.  [name]  It is getting done.  Hopefully will be done by July.  When we last saw him, everything is doing pretty well.  His last iron saturation was 59%.  The ferritin was 33.  We will just continue to watch him.  He can certainly donate blood.  From my point of view, he has no contraindications to donating blood.  I think this would certainly help out him in the community at large.  He has had no problems with nausea or vomiting.  He has had no headache.  He said no problem with bowels or bladder.  He has had no leg swelling.  He had no rashes.  Overall, I would say his performance status is ECOG 0.    Medications:  Current Outpatient Medications:    cyproheptadine (PERIACTIN) 4 MG tablet, Take 4 mg by mouth daily., Disp: , Rfl:    ampicillin (PRINCIPEN) 500 MG capsule, Take 500 mg by mouth daily. (Patient not taking: Reported on 08/12/2023), Disp: , Rfl:    aspirin EC 81 MG tablet, Take 81 mg by mouth daily., Disp: , Rfl:    Efinaconazole 10 % SOLN, Apply 1 drop topically daily., Disp: 8 mL, Rfl: 11   finasteride (PROSCAR) 5 MG tablet, Take 1 mg by mouth daily. Cut in 4 pieces (Patient not taking: Reported on 08/12/2023), Disp: , Rfl:    hydrochlorothiazide (HYDRODIURIL) 12.5 MG tablet, Take 12.5 mg by mouth every morning., Disp: , Rfl:    Multiple Vitamin (MULTIVITAMIN WITH MINERALS) TABS tablet, Take 1 tablet by mouth daily. Reported on 06/28/2015, Disp: , Rfl:   Allergies:  No Known Allergies    Past Medical History, Surgical history, Social history, and Family History were  reviewed and updated.  Review of Systems: Review of Systems  Constitutional: Negative.   HENT:  Negative.    Eyes: Negative.   Respiratory: Negative.    Cardiovascular: Negative.   Gastrointestinal: Negative.   Endocrine: Negative.   Genitourinary: Negative.    Musculoskeletal: Negative.   Skin: Negative.   Neurological: Negative.   Hematological: Negative.   Psychiatric/Behavioral: Negative.      Physical Exam:  height is 6\' 2"  (1.88 m) and weight is 194 lb (88 kg). His oral temperature is 97.9 F (36.6 C). His blood pressure is 119/90 (abnormal) and his pulse is 80. His respiration is 16 and oxygen saturation is 100%.   Wt Readings from Last 3 Encounters:  10/07/23 194 lb (88 kg)  04/05/23 194 lb (88 kg)  10/10/22 196 lb (88.9 kg)    Physical Exam Vitals reviewed.  HENT:     Head: Normocephalic and atraumatic.  Eyes:     Pupils: Pupils are equal, round, and reactive to light.  Cardiovascular:     Rate and Rhythm: Normal rate and regular rhythm.     Heart sounds: Normal heart sounds.  Pulmonary:     Effort: Pulmonary effort is normal.     Breath sounds: Normal breath sounds.  Abdominal:     General: Bowel sounds are normal.     Palpations: Abdomen  is soft.  Musculoskeletal:        General: No tenderness or deformity. Normal range of motion.     Cervical back: Normal range of motion.  Lymphadenopathy:     Cervical: No cervical adenopathy.  Skin:    General: Skin is warm and dry.     Findings: No erythema or rash.  Neurological:     Mental Status: He is alert and oriented to person, place, and time.  Psychiatric:        Behavior: Behavior normal.        Thought Content: Thought content normal.        Judgment: Judgment normal.     Lab Results  Component Value Date   WBC 4.3 10/07/2023   HGB 15.4 10/07/2023   HCT 45.6 10/07/2023   MCV 87.7 10/07/2023   PLT 191 10/07/2023     Chemistry      Component Value Date/Time   NA 143 10/07/2023 1451   NA  141 10/11/2020 0841   K 4.0 10/07/2023 1451   CL 104 10/07/2023 1451   CO2 31 10/07/2023 1451   BUN 23 10/07/2023 1451   BUN 21 10/11/2020 0841   CREATININE 1.36 (H) 10/07/2023 1451   CREATININE 1.05 03/22/2022 1020      Component Value Date/Time   CALCIUM 9.4 10/07/2023 1451   ALKPHOS 71 10/07/2023 1451   AST 18 10/07/2023 1451   ALT 14 10/07/2023 1451   BILITOT 0.5 10/07/2023 1451      Impression and Plan: Randall Bradley is a very nice 68 year old white male.  He has hereditary hemochromatosis.  He has the major mutation.  He is homozygous for this.    Again, I encouraged him to be able to donate blood.  I do not see a reason why he cannot donate blood.  [place] , it be a lot easier for him to just stay up very to donate blood and just see us  every 6 months.  We will plan to get him back in 6 months.  Ivor Mars, MD 4/14/20254:13 PM

## 2023-10-08 LAB — IRON AND IRON BINDING CAPACITY (CC-WL,HP ONLY)
Iron: 148 ug/dL (ref 45–182)
Saturation Ratios: 49 % — ABNORMAL HIGH (ref 17.9–39.5)
TIBC: 302 ug/dL (ref 250–450)
UIBC: 154 ug/dL (ref 117–376)

## 2023-10-10 NOTE — Progress Notes (Deleted)
  Cardiology Office Note:  .   Date:  10/10/2023  ID:  RONN SMOLINSKY, DOB [birth year], MRN [id number] PCP: Berta Brittle, MD (Inactive)  [facility] Providers Cardiologist:  None { Click to update primary MD,subspecialty MD or APP then REFRESH:1}   History of Present Illness: .   No chief complaint on file.   Randall Bradley is a 68 y.o. male with history of elevated coronary calcium  score who presents for follow-up.      Problem List Coronary calcium   -CAC 240 (68th percentile) HLD -T chol 118, HDL 47, LDL 59, TG 52 Hemochromatosis  Non-tuberculosis mycobacterium infection (MAC) HTN    ROS: All other ROS reviewed and negative. Pertinent positives noted in the HPI.     Studies Reviewed: Aaron Aas       CAC 06/15/2020 IMPRESSION: Coronary calcium  score of 240. This was 68th percentile for age and sex matched controls. Physical Exam:   VS:  There were no vitals taken for this visit.   Wt Readings from Last 3 Encounters:  10/07/23 194 lb (88 kg)  04/05/23 194 lb (88 kg)  10/10/22 196 lb (88.9 kg)    GEN: Well nourished, well developed in no acute distress NECK: No JVD; No carotid bruits CARDIAC: ***RRR, no murmurs, rubs, gallops RESPIRATORY:  Clear to auscultation without rales, wheezing or rhonchi  ABDOMEN: Soft, non-tender, non-distended EXTREMITIES:  No edema; No deformity  ASSESSMENT AND PLAN: .   ***    {Are you ordering a CV Procedure (e.g. stress test, cath, DCCV, TEE, etc)?   Press F2        :[id number]}   Follow-up: No follow-ups on file.  Time Spent with Patient: I have spent a total of *** minutes caring for this patient today face to face, ordering and reviewing labs/tests, reviewing prior records/medical history, examining the patient, establishing an assessment and plan, communicating results/findings to the patient/family, and documenting in the medical record.   Signed, Gigi Kyle. Rolm Clos, MD, [facility]  [facility]  [address],  [address] [place], [place] [address] [phone]  9:51 PM

## 2023-10-11 ENCOUNTER — Ambulatory Visit: Payer: BLUE CROSS/BLUE SHIELD | Attending: Cardiovascular Disease | Admitting: Cardiovascular Disease

## 2023-10-11 DIAGNOSIS — E782 Mixed hyperlipidemia: Secondary | ICD-10-CM

## 2023-10-11 DIAGNOSIS — R931 Abnormal findings on diagnostic imaging of heart and coronary circulation: Secondary | ICD-10-CM

## 2023-10-30 ENCOUNTER — Telehealth: Payer: Self-pay

## 2023-10-30 NOTE — Telephone Encounter (Signed)
 LVM to schedule orthotic pick up

## 2023-11-26 ENCOUNTER — Encounter: Admitting: Internal Medicine

## 2023-12-10 ENCOUNTER — Ambulatory Visit: Admitting: Internal Medicine

## 2023-12-20 ENCOUNTER — Telehealth: Payer: Self-pay | Admitting: Podiatry

## 2023-12-20 NOTE — Telephone Encounter (Signed)
 Called to schedule orthotics pu. Could not leave message

## 2023-12-23 ENCOUNTER — Ambulatory Visit (INDEPENDENT_AMBULATORY_CARE_PROVIDER_SITE_OTHER): Admitting: Internal Medicine

## 2023-12-23 ENCOUNTER — Encounter: Payer: Self-pay | Admitting: Internal Medicine

## 2023-12-23 VITALS — BP 135/91 | HR 67 | Ht 74.0 in | Wt 191.2 lb

## 2023-12-23 DIAGNOSIS — Z87891 Personal history of nicotine dependence: Secondary | ICD-10-CM

## 2023-12-23 DIAGNOSIS — A310 Pulmonary mycobacterial infection: Secondary | ICD-10-CM | POA: Diagnosis not present

## 2023-12-23 DIAGNOSIS — L299 Pruritus, unspecified: Secondary | ICD-10-CM | POA: Diagnosis not present

## 2023-12-23 DIAGNOSIS — R053 Chronic cough: Secondary | ICD-10-CM

## 2023-12-23 MED ORDER — MONTELUKAST SODIUM 10 MG PO TABS: 10.0000 mg | ORAL_TABLET | Freq: Every day | ORAL | 11 refills | Status: DC

## 2023-12-23 MED ORDER — AZELASTINE HCL 0.1 % NA SOLN
1.0000 | Freq: Every day | NASAL | 12 refills | Status: AC
Start: 2023-12-23 — End: ?

## 2023-12-23 NOTE — Patient Instructions (Addendum)
 It was a pleasure to see you today!  Please schedule follow up with myself in.  If my schedule is not open yet, we will contact you with a reminder closer to that time. Please call [phone] if you haven't heard from us  a month before, and always call us  sooner if issues or concerns arise. You can also send us  a message through MyChart, but but aware that this is not to be used for urgent issues and it may take up to 5-7 days to receive a reply. Please be aware that you will likely be able to view your results before I have a chance to respond to them. Please give us  5 business days to respond to any non-urgent results.   I think your pruritus - itching - is most likely related to the hereditary hemachromatosis. Worthwhile to see allergy to see if there are any alternatives to cyproheptadine and also to make sure there is no other allergic disorder at play.    We can try montelukast 1 pill once daily to see if that helps your post nasal drip. If that doesn't help try astelin nasal spray. Use saline gel on a q-tip to help with the dryness.   astelin - 1 spray on each side of your nose once daily  Instructions for use: If you also use a saline nasal spray or rinse, use that first. Position the head with the chin slightly tucked. Use the right hand to spray into the left nostril and the right hand to spray into the left nostril.   Point the bottle away from the septum of your nose (cartilage that divides the two sides of your nose).  Hold the nostril closed on the opposite side from where you will spray Spray once and gently sniff to pull the medicine into the higher parts of your nose.  Don't sniff too hard as the medicine will drain down the back of your throat instead. Repeat with a second spray on the same side if prescribed. Repeat on the other side of your nose.

## 2023-12-23 NOTE — Progress Notes (Signed)
 Randall Bradley    [id number]    11/13/1955  Primary Care Physician:Henderson, Dorn LABOR, MD Date of Appointment: 12/23/2023 Established Patient Visit  Chief complaint:   Chief Complaint  Patient presents with   Consult    Follow-up chronic cough, patient states he feel better     HPI: Randall Bradley is a 68 y.o. man with chronic cough felt to be secondary to LPR and post nasal drainage. Also had abnormal CT Chest and underwent bronchoscopy which diagnosed Pulmonary NTM infection and did 18 months of therapy, completed June 2024. Additional past medical history of hereditary hemachromatosis.   Interval Updates: Former patient of Dr. Gladis. Here for follow up since 2023 and to establish care. Wanted to follow up after a year of completing NTM.  No fevers, chills, night sweats, weight loss. Appetite is ok. No dyspnea. His severe cough related to all this has improved substaintially.   Still has post nasal drainage which causes his cough.  He tried nasal sprays - atrovent  and flonase .  No seasonal component to sinus drainage.  Has daily rhinorrhea.  No itchy eyes, sore throat. He takes allegra daily - does think it helps but doesn't fully eradicate it.   Has been on cyproheptadine chronically for pruritus >20 years. Denies urticaria, anaphylaxis, asthma, drug allergies. Cannot tolerate tapering off this due to severe itching. Father had similar disorder.  I have reviewed the patient's family social and past medical history and updated as appropriate.   Past Medical History:  Diagnosis Date   Alopecia    Arthritis    finger   Bilateral carpal tunnel syndrome 07/12/2020   COVID 03/2021   mild   GERD (gastroesophageal reflux disease)    mild   Hematuria    Hemochromatosis    Hemochromatosis, hereditary (HCC) 03/16/2020   Hereditary hemochromatosis (HCC) monitored by PCP  dr lamar gates   history of phlebotomies -- last one 2014 (pt states body has normalized)    History of bladder stone    History of kidney stones    Hypertension    no medications at this time.   Prostate cancer (HCC)    UROLOGIST-- DR MATILDA; S/P PROSTATECTOMY 03-17-2008   Right ureteral stone    Rosacea     Past Surgical History:  Procedure Laterality Date   APPENDECTOMY  06/25/1961   BRONCHIAL WASHINGS  01/05/2022   Procedure: BRONCHIAL WASHINGS;  Surgeon: Gladis Leonor HERO, MD;  Location: [facility] ENDOSCOPY;  Service: Pulmonary;;   COLONOSCOPY     COMPLEX WOUND CLOSURE Left 03/26/2009   INDEX FINGER COMPLEX WOUND LACERATION REPAIR   CYSTOLITHOLAPAXY AND URETHRAL DILATION  10/05/2009   CYSTOSCOPY W/ RETROGRADES Right 10/11/2014   Procedure: CYSTOSCOPY WITH RETROGRADE PYELOGRAM;  Surgeon: Garnette MATILDA, MD;  Location[facility];  Service: Urology;  Laterality: Right;   HOLMIUM LASER APPLICATION N/A 10/11/2014   Procedure: HOLMIUM LASER LITHOTRIPSY, ;  Surgeon: Garnette MATILDA, MD;  Location: [facility];  Service: Urology;  Laterality: N/A;   JOINT REPLACEMENT     KNEE ARTHROSCOPY Left 06/25/1993   LIPOMA EXCISION  03/04/2000   forehead   ROBOT ASSISTED LAPAROSCOPIC RADICAL PROSTATECTOMY  03/17/2008   w/  BILATERAL PELVIC LYMPHADENECTOMY (NON-NERVE SPARING)   TOTAL KNEE ARTHROPLASTY Left 04/27/2015   Procedure: TOTAL KNEE ARTHROPLASTY;  Surgeon: Toribio JULIANNA Chancy, MD;  Location[facility] OR;  Service: Orthopedics;  Laterality: Left;   TOTAL KNEE ARTHROPLASTY Right 06/08/2015  TOTAL KNEE ARTHROPLASTY Right 06/08/2015   Procedure: TOTAL RIGHT KNEE ARTHROPLASTY;  Surgeon: Toribio JULIANNA Chancy, MD;  Location: [facility] OR;  Service: Orthopedics;  Laterality: Right;   VIDEO BRONCHOSCOPY Right 01/05/2022   Procedure: VIDEO BRONCHOSCOPY WITHOUT FLUORO;  Surgeon: Gladis Leonor HERO, MD;  Location: [facility] ENDOSCOPY;  Service: Pulmonary;  Laterality: Right;    Family History  Problem Relation Age of Onset   Heart attack Mother    Heart attack Father      Social History   Occupational History   Occupation: Retried   Occupation: Retired Retail buyer  Tobacco Use   Smoking status: Former    Current packs/day: 0.00    Average packs/day: 0.3 packs/day for 15.0 years (3.8 ttl pk-yrs)    Types: Cigarettes    Start date: 10/08/1978    Quit date: 10/07/1993    Years since quitting: 30.2   Smokeless tobacco: Never  Vaping Use   Vaping status: Never Used  Substance and Sexual Activity   Alcohol use: Not Currently    Comment: I quit drinking in ~ 2004   Drug use: No   Sexual activity: Never     Physical Exam: Blood pressure (!) 135/91, pulse 67, height 6' 2 (1.88 m), weight 191 lb 3.2 oz (86.7 kg), SpO2 97%.  Gen:      No acute distress ENT:  mild nasal debris, no nasal polyps, mucus membranes moist Lungs:    No increased respiratory effort, symmetric chest wall excursion, clear to auscultation bilaterally, no wheezes or crackles CV:         Regular rate and rhythm; no murmurs, rubs, or gallops.  No pedal edema   Data Reviewed: Imaging: I have personally reviewed the ct chest October 2023 which shows centrilobular tree in bud opacities c/w ntm infection   PFTs:     Latest Ref Rng & Units 03/10/2021   12:01 PM  PFT Results  FVC-Pre L 4.76   FVC-Predicted Pre % 90   FVC-Post L 4.91   FVC-Predicted Post % 93   Pre FEV1/FVC % % 73   Post FEV1/FCV % % 76   FEV1-Pre L 3.47   FEV1-Predicted Pre % 89   FEV1-Post L 3.71   DLCO uncorrected ml/min/mmHg 28.10   DLCO UNC% % 94   DLCO corrected ml/min/mmHg 28.10   DLCO COR %Predicted % 94   DLVA Predicted % 93   TLC L 8.12   TLC % Predicted % 103   RV % Predicted % 117    I have personally reviewed the patient's PFTs and normal pulmonary function  Labs:  Immunization status: Immunization History  Administered Date(s) Administered   DTaP 11/07/2009   Hepatitis A 02/22/2005, 08/15/2012   Hepatitis A, Ped/Adol-2 Dose 02/22/2005, 08/15/2012   Hepatitis B 02/22/2005,  08/15/2012, 09/15/2012   Hepatitis B, PED/ADOLESCENT 02/22/2005, 08/15/2012, 09/15/2012   IPV 02/22/2005   Influenza Split 04/11/2012, 04/28/2015, 05/16/2015, 03/28/2018   Influenza, High Dose Seasonal PF 03/18/2019   Influenza,inj,Quad PF,6+ Mos 04/28/2015   Pneumococcal Conjugate-13 08/31/2013   Pneumococcal Polysaccharide-23 04/28/2015, 05/16/2015   Tdap 02/22/2005, 08/07/2005, 12/15/2018   Typhoid Inactivated 02/22/2005, 08/15/2012   Typhoid Parenteral 02/22/2005   Yellow Fever 08/15/2012   Zoster, Live 12/15/2018, 03/18/2019    External Records Personally Reviewed: oncology, pulmonary  Assessment:  History of pulmonary MAC infection s/p treatment completed June 2024 Chronic cough related to post nasal drainage Chronic pruritus Hereditary hemachromatosis  Plan/Recommendations:  I think your pruritus - itching - is most likely related  to the hereditary hemachromatosis. Worthwhile to see allergy to see if there are any alternatives to cyproheptadine and also to make sure there is no other allergic disorder at play.    We can try montelukast 1 pill once daily to see if that helps your post nasal drip. If that doesn't help try astelin nasal spray. Use saline gel on a q-tip to help with the dryness.   astelin - 1 spray on each side of your nose once daily  I spent 35 minutes on 12/23/2023 in care of this patient including face to face time and non-face to face time spent charting, review of outside records, and coordination of care.  Return to Care: Return in 1 year (on 12/22/2024), or if symptoms worsen or fail to improve.   Verdon Gore, MD Pulmonary and Critical Care Medicine [facility] [phone]

## 2024-02-10 ENCOUNTER — Encounter (HOSPITAL_COMMUNITY): Payer: Self-pay | Admitting: *Deleted

## 2024-02-10 ENCOUNTER — Encounter: Payer: Self-pay | Admitting: Hematology & Oncology

## 2024-02-10 ENCOUNTER — Other Ambulatory Visit: Payer: Self-pay

## 2024-02-10 ENCOUNTER — Ambulatory Visit: Admitting: Internal Medicine

## 2024-02-10 VITALS — BP 122/90 | HR 87 | Temp 98.1°F | Resp 18 | Ht 71.73 in | Wt 190.0 lb

## 2024-02-10 DIAGNOSIS — L299 Pruritus, unspecified: Secondary | ICD-10-CM

## 2024-02-10 DIAGNOSIS — J3089 Other allergic rhinitis: Secondary | ICD-10-CM

## 2024-02-10 DIAGNOSIS — R053 Chronic cough: Secondary | ICD-10-CM

## 2024-02-10 DIAGNOSIS — R0982 Postnasal drip: Secondary | ICD-10-CM

## 2024-02-10 DIAGNOSIS — L503 Dermatographic urticaria: Secondary | ICD-10-CM

## 2024-02-10 NOTE — Progress Notes (Signed)
 NEW PATIENT  Date of Service/Encounter:  02/10/24  Consult requested by: Charlott Dorn LABOR, MD   Subjective:   Randall Bradley (DOB: October 06, 1955) is a 68 y.o. male who presents to the clinic on 02/10/2024 with a chief complaint of Establish Care (States that he has had a persistent cough that he has had for 5 years. Been told that it could be GERD but nothing relieved it. Pt reports that he has post nasal drip constantly. Pulmonology mentioned seeing allergy specialist. ) .    History obtained from: chart review and patient.   Pruritus: Him and his father had trouble with chronic itching where his hands and ankles would get very itchy.  Taken cyproheptadine for over 20 years and then recently was told to stop it and try another treatment.  Has stopped it and now on Allegra and noted it helps with the itching but does not last the entire 24 hours.  Has noted hives/welts when scratching. Has hemochromatosis, controlled.   Rhinitis:  Started about 10 years ago.  Symptoms include: post nasal drainage, cough, throat clearing  Occurs year-round Potential triggers: not sure  Treatments tried:  PRN anti histamines Nose sprays (Flonase /Nasonex/Nasacort), some nosebleeds also with it Now taking Azelastine   Never used Atrovent   Previous allergy testing: yes, 40+ years ago, pollens  History of sinus surgery: no Nonallergic triggers:  eating    Reviewed:  12/23/2023: seen by Pulm Dr Meade for chronic cough, post nasal drainage, LPR and hx of MAC pulm.  Started on Singulair , consider Azelastine . Referred to allergy for pruritus also, has hereditary hemochromatosis on cyproheptadine.   10/07/2023: seen by Dr Timmy for hemochromatosis, controlled, plan for ferritin less than 50 and iron sat <30% with blood donation.  09/2023: ferritin 22  03/19/2023: seen by Dr Carlie for post nasal drainage and chronic rhinitis. Tried Allegra and Atrovent . Consider referral to Allergy.   Past Medical  History: Past Medical History:  Diagnosis Date   Alopecia    Arthritis    finger   Bilateral carpal tunnel syndrome 07/12/2020   COVID 03/2021   mild   GERD (gastroesophageal reflux disease)    mild   Hematuria    Hemochromatosis    Hemochromatosis, hereditary (HCC) 03/16/2020   Hereditary hemochromatosis (HCC) monitored by PCP  dr lamar gates   history of phlebotomies -- last one 2014 (pt states body has normalized)   History of bladder stone    History of kidney stones    Hypertension    no medications at this time.   Prostate cancer (HCC)    UROLOGIST-- DR MATILDA; S/P PROSTATECTOMY 03-17-2008   Right ureteral stone    Rosacea    Past Surgical History: Past Surgical History:  Procedure Laterality Date   APPENDECTOMY  06/25/1961   BRONCHIAL WASHINGS  01/05/2022   Procedure: BRONCHIAL WASHINGS;  Surgeon: Gladis Leonor HERO, MD;  Location: [facility] ENDOSCOPY;  Service: Pulmonary;;   COLONOSCOPY     COMPLEX WOUND CLOSURE Left 03/26/2009   INDEX FINGER COMPLEX WOUND LACERATION REPAIR   CYSTOLITHOLAPAXY AND URETHRAL DILATION  10/05/2009   CYSTOSCOPY W/ RETROGRADES Right 10/11/2014   Procedure: CYSTOSCOPY WITH RETROGRADE PYELOGRAM;  Surgeon: Garnette MATILDA, MD;  Location: [facility];  Service: Urology;  Laterality: Right;   HOLMIUM LASER APPLICATION N/A 10/11/2014   Procedure: HOLMIUM LASER LITHOTRIPSY, ;  Surgeon: Garnette MATILDA, MD;  Location: [facility];  Service: Urology;  Laterality: N/A;   JOINT REPLACEMENT     KNEE  ARTHROSCOPY Left 06/25/1993   LIPOMA EXCISION  03/04/2000   forehead   ROBOT ASSISTED LAPAROSCOPIC RADICAL PROSTATECTOMY  03/17/2008   w/  BILATERAL PELVIC LYMPHADENECTOMY (NON-NERVE SPARING)   TOTAL KNEE ARTHROPLASTY Left 04/27/2015   Procedure: TOTAL KNEE ARTHROPLASTY;  Surgeon: Toribio JULIANNA Chancy, MD;  Location: [facility] OR;  Service: Orthopedics;  Laterality: Left;   TOTAL KNEE ARTHROPLASTY Right 06/08/2015   TOTAL KNEE  ARTHROPLASTY Right 06/08/2015   Procedure: TOTAL RIGHT KNEE ARTHROPLASTY;  Surgeon: Toribio JULIANNA Chancy, MD;  Location: [facility] OR;  Service: Orthopedics;  Laterality: Right;   VIDEO BRONCHOSCOPY Right 01/05/2022   Procedure: VIDEO BRONCHOSCOPY WITHOUT FLUORO;  Surgeon: Gladis Leonor HERO, MD;  Location: [facility] ENDOSCOPY;  Service: Pulmonary;  Laterality: Right;    Family History: Family History  Problem Relation Age of Onset   Heart attack Mother    Heart attack Father     Social History:  Flooring in bedroom: wood Pets: dog Tobacco use/exposure: none Job: retired  Medication List:  Allergies as of 02/10/2024   No Known Allergies      Medication List        Accurate as of February 10, 2024  2:46 PM. If you have any questions, ask your nurse or doctor.          ampicillin 500 MG capsule Commonly known as: PRINCIPEN Take 500 mg by mouth daily.   aspirin EC 81 MG tablet Take 81 mg by mouth daily.   azelastine  0.1 % nasal spray Commonly known as: ASTELIN  Place 1 spray into both nostrils daily. Use in each nostril as directed   cyproheptadine 4 MG tablet Commonly known as: PERIACTIN Take 4 mg by mouth daily.   Efinaconazole  10 % Soln Apply 1 drop topically daily.   finasteride  5 MG tablet Commonly known as: PROSCAR  Take 1 mg by mouth daily. Cut in 4 pieces   hydrochlorothiazide 12.5 MG tablet Commonly known as: HYDRODIURIL Take 12.5 mg by mouth every morning.   montelukast  10 MG tablet Commonly known as: SINGULAIR  Take 1 tablet (10 mg total) by mouth at bedtime.   multivitamin with minerals Tabs tablet Take 1 tablet by mouth daily. Reported on 06/28/2015         REVIEW OF SYSTEMS: Pertinent positives and negatives discussed in HPI.   Objective:   Physical Exam: BP (!) 122/90 (BP Location: Left Arm, Patient Position: Sitting, Cuff Size: Normal)   Pulse 87   Temp 98.1 F (36.7 C) (Temporal)   Resp 18   Ht 5' 11.73 (1.822 m)   Wt 190 lb (86.2 kg)   SpO2  97%   BMI 25.96 kg/m  Body mass index is 25.96 kg/m. GEN: alert, well developed HEENT: clear conjunctiva, nose with + mild inferior turbinate hypertrophy, pink nasal mucosa, slight clear rhinorrhea, + cobblestoning HEART: regular rate and rhythm, no murmur LUNGS: clear to auscultation bilaterally, no coughing, unlabored respiration ABDOMEN: soft, non distended  SKIN: no rashes or lesions  Assessment:   1. Other allergic rhinitis   2. Pruritus   3. Dermatographism   4. Post-nasal drainage   5. Chronic cough     Plan/Recommendations:  Other Allergic Rhinitis: Post Nasal Drip with Chronic Cough  - Due to turbinate hypertrophy, hives/pruritus, chronic cough and unresponsive to over the counter meds, will perform skin testing to identify aeroallergen triggers.   - Use nasal saline rinses before nose sprays such as with Neilmed Sinus Rinse.  Use distilled water.   - Use Azelastine  1 sprays each  nostril twice daily. Aim upward and outward. - Use Allegra 180mg  daily.  - Use Singulair  10mg  daily. Stop if there are any mood/behavioral changes. - Avoid frequent throat clearing.  Instead take a sip of water.   Dermatographism Pruritus - Use Allegra 180mg  daily.  If symptoms worsen, use Allegra 180mg  twice daily.    Hold all anti-histamines (Xyzal, Allegra, Zyrtec, Claritin, Benadryl , Pepcid) 3 days prior to next visit.  Follow up: 8/26 at  930 AM for skin testing 1-55, [facility]   Arleta Blanch, MD [facility]  

## 2024-02-10 NOTE — Patient Instructions (Addendum)
 Other Allergic Rhinitis: Post Nasal Drip with Chronic Cough  - Use nasal saline rinses before nose sprays such as with Neilmed Sinus Rinse.  Use distilled water.   - Use Azelastine  1 sprays each nostril twice daily. Aim upward and outward. - Use Allegra 180mg  daily.  - Use Singulair  10mg  daily. Stop if there are any mood/behavioral changes. - Avoid frequent throat clearing.  Instead take a sip of water.   Dermatographism Pruritus (Itching) - Use Allegra 180mg  daily.  If symptoms worsen, use Allegra 180mg  twice daily.    Hold all anti-histamines (Xyzal, Allegra, Zyrtec, Claritin, Benadryl , Pepcid) 3 days prior to next visit.  Follow up: 8/26 at  930 AM for skin testing 1-55, [facility]  [address] [place], [place], [place] [address]

## 2024-02-13 ENCOUNTER — Encounter: Payer: Self-pay | Admitting: Hematology & Oncology

## 2024-02-13 ENCOUNTER — Encounter (HOSPITAL_COMMUNITY): Payer: Self-pay | Admitting: *Deleted

## 2024-02-14 LAB — LAB REPORT - SCANNED: EGFR: 73

## 2024-02-17 ENCOUNTER — Telehealth: Payer: Self-pay | Admitting: *Deleted

## 2024-02-17 NOTE — Telephone Encounter (Signed)
 Patient called to set up a phlebotomy because Iron Sat was 65 at primary care office.  Phlebotomy appt made for tomorrow

## 2024-02-18 ENCOUNTER — Ambulatory Visit: Admitting: Internal Medicine

## 2024-02-18 ENCOUNTER — Encounter: Payer: Self-pay | Admitting: Internal Medicine

## 2024-02-18 ENCOUNTER — Encounter: Payer: Self-pay | Admitting: Hematology & Oncology

## 2024-02-18 ENCOUNTER — Encounter (HOSPITAL_COMMUNITY): Payer: Self-pay | Admitting: *Deleted

## 2024-02-18 ENCOUNTER — Inpatient Hospital Stay: Attending: Hematology & Oncology

## 2024-02-18 DIAGNOSIS — Z79899 Other long term (current) drug therapy: Secondary | ICD-10-CM | POA: Diagnosis not present

## 2024-02-18 DIAGNOSIS — J3089 Other allergic rhinitis: Secondary | ICD-10-CM | POA: Diagnosis not present

## 2024-02-18 MED ORDER — IPRATROPIUM BROMIDE 0.06 % NA SOLN: 2.0000 | Freq: Three times a day (TID) | NASAL | 5 refills | Status: DC | PRN

## 2024-02-18 NOTE — Patient Instructions (Signed)

## 2024-02-18 NOTE — Patient Instructions (Addendum)
 Chronic Rhinitis: Post Nasal Drip with Chronic Cough  - SPT 01/2024: negative  - Use nasal saline rinses before nose sprays such as with Neilmed Sinus Rinse.  Use distilled water.   - Use Azelastine  1 sprays each nostril twice daily. Aim upward and outward. - Use Ipratroprium 1-2 sprays up to three times daily as needed for runny nose. Aim upward and outward. - Use Allegra 180mg  daily.  - Use Singulair  10mg  daily. Stop if there are any mood/behavioral changes. - Avoid frequent throat clearing.  Instead take a sip of water.   Dermatographism Pruritus (Itching) - Use Allegra 180mg  daily.  If symptoms worsen, use Allegra 180mg  twice daily.

## 2024-02-18 NOTE — Progress Notes (Signed)
 Randall Bradley presents today for phlebotomy per MD orders. Phlebotomy procedure started at 3:02 PM and ended at 3:12 PM 532 grams removed via 16 gauge needle to right AC.  Patient observed for 30 minutes after procedure without any incident. Patient tolerated procedure well and declined replacement fluids after procedure.   Patient understands to call if he has any questions or concerns post discharge.

## 2024-02-18 NOTE — Progress Notes (Signed)
 FOLLOW UP Date of Service/Encounter:  02/18/24   Subjective:  Randall Bradley (DOB: [birth year]) is a 68 y.o. male who [facility] for follow up for skin testing.   History obtained from: chart review and patient.  Anti histamines held.   Past Medical History: Past Medical History:  Diagnosis Date   Alopecia    Arthritis    finger   Bilateral carpal tunnel syndrome 07/12/2020   COVID 03/2021   mild   GERD (gastroesophageal reflux disease)    mild   Hematuria    Hemochromatosis    Hemochromatosis, hereditary (HCC) 03/16/2020   Hereditary hemochromatosis (HCC) monitored by PCP  dr lamar gates   history of phlebotomies -- last one 2014 (pt states body has normalized)   History of bladder stone    History of kidney stones    Hypertension    no medications at this time.   Prostate cancer (HCC)    UROLOGIST-- DR MATILDA; S/P PROSTATECTOMY 03-17-2008   Right ureteral stone    Rosacea     Objective:  There were no vitals taken for this visit. There is no height or weight on file to calculate BMI. Physical Exam: GEN: alert, well developed HEENT: clear conjunctiva, MMM LUNGS: unlabored respiration   Skin Testing:  Skin prick testing was placed, which includes aeroallergens/foods, histamine control, and saline control.  Verbal consent was obtained prior to placing test.  Patient tolerated procedure well.  Allergy  testing results were read and interpreted by myself, documented by clinical staff. Adequate positive and negative control.  Positive results to:  Results discussed with patient/family.  Airborne Adult Perc - 02/18/24 0927     Time Antigen Placed 9072    Allergen Manufacturer Jestine    Location Back    Number of Test 55    1. Control-Buffer 50% Glycerol Negative    2. Control-Histamine 3+    3. Bahia Negative    4. French Southern Territories Negative    5. Johnson Negative    6. [place]  Blue Negative    7. Meadow Fescue  Negative    8. Perennial Rye Negative    9. Timothy Negative    10. Ragweed Mix Negative    11. Cocklebur Negative    12. Plantain,  English Negative    13. Baccharis Negative    14. Dog Fennel Negative    15. Russian Thistle Negative    16. Lamb's Quarters Negative    17. Sheep Sorrell Negative    18. Rough Pigweed Negative    19. Marsh Elder, Rough Negative    20. Mugwort, Common Negative    21. Box, Elder Negative    22. Cedar, red Negative    23. Sweet Gum Negative    24. Pecan Pollen Negative    25. Pine Mix Negative    26. Walnut, Black Pollen Negative    27. Red Mulberry Negative    28. Ash Mix Negative    29. Birch Mix Negative    30. Beech American Negative    31. Cottonwood, Guinea[place]issau Negative    32. Hickory, White Negative    33. Maple Mix Negative    34. Oak, [place] Mix Negative    35. Sycamore Eastern Negative    36. Alternaria Alternata Negative    37. Cladosporium Herbarum Negative    38. Aspergillus Mix Negative    39. Penicillium Mix Negative    40. Bipolaris Sorokiniana (Helminthosporium) Negative    41.  Drechslera Spicifera (Curvularia) Negative    42. Mucor Plumbeus Negative    43. Fusarium Moniliforme Negative    44. Aureobasidium Pullulans (pullulara) Negative    45. Rhizopus Oryzae Negative    46. Botrytis Cinera Negative    47. Epicoccum Nigrum Negative    48. Phoma Betae Negative    49. Dust Mite Mix Negative    50. Cat Hair 10,000 BAU/ml Negative    51.  Dog Epithelia Negative    52. Mixed Feathers Negative    53. Horse Epithelia Negative    54. Cockroach, German Negative    55. Tobacco Leaf Negative           Assessment:   1. Other allergic rhinitis     Plan/Recommendations:  Other Allergic Rhinitis: Post Nasal Drip with Chronic Cough  - SPT 01/2024: negative  - Use nasal saline rinses before nose sprays such as with Neilmed Sinus Rinse.  Use distilled water.   - Use Azelastine  1 sprays each nostril twice daily. Aim upward and  outward. - Use Ipratroprium 1-2 sprays up to three times daily as needed for runny nose. Aim upward and outward. - Use Allegra 180mg  daily.  - Use Singulair  10mg  daily. Stop if there are any mood/behavioral changes. - Avoid frequent throat clearing.  Instead take a sip of water.   Dermatographism Pruritus (Itching) - Use Allegra 180mg  daily.  If symptoms worsen, use Allegra 180mg  twice daily.       Return in about 3 months (around 05/20/2024).  Arleta Blanch, MD [facility] 

## 2024-04-08 ENCOUNTER — Inpatient Hospital Stay

## 2024-04-08 ENCOUNTER — Inpatient Hospital Stay: Admitting: Hematology & Oncology

## 2024-04-23 ENCOUNTER — Inpatient Hospital Stay: Admitting: Hematology & Oncology

## 2024-04-23 ENCOUNTER — Inpatient Hospital Stay: Attending: Hematology & Oncology

## 2024-04-23 DIAGNOSIS — Z7982 Long term (current) use of aspirin: Secondary | ICD-10-CM | POA: Diagnosis not present

## 2024-04-23 DIAGNOSIS — Z79899 Other long term (current) drug therapy: Secondary | ICD-10-CM | POA: Diagnosis not present

## 2024-04-23 DIAGNOSIS — B159 Hepatitis A without hepatic coma: Secondary | ICD-10-CM | POA: Insufficient documentation

## 2024-04-23 LAB — CMP (CANCER CENTER ONLY)
ALT: 18 U/L (ref 0–44)
AST: 22 U/L (ref 15–41)
Albumin: 4.5 g/dL (ref 3.5–5.0)
Alkaline Phosphatase: 90 U/L (ref 38–126)
Anion gap: 10 (ref 5–15)
BUN: 26 mg/dL — ABNORMAL HIGH (ref 8–23)
CO2: 28 mmol/L (ref 22–32)
Calcium: 9.5 mg/dL (ref 8.9–10.3)
Chloride: 101 mmol/L (ref 98–111)
Creatinine: 1.11 mg/dL (ref 0.61–1.24)
GFR, Estimated: >60 mL/min (ref >60)
Glucose, Bld: 102 mg/dL — ABNORMAL HIGH (ref 70–99)
Potassium: 4.5 mmol/L (ref 3.5–5.1)
Sodium: 139 mmol/L (ref 135–145)
Total Bilirubin: 0.5 mg/dL (ref 0.0–1.2)
Total Protein: 7.1 g/dL (ref 6.5–8.1)

## 2024-04-23 LAB — IRON AND IRON BINDING CAPACITY (CC-WL,HP ONLY)
Iron: 173 ug/dL (ref 45–182)
Saturation Ratios: 59 % — ABNORMAL HIGH (ref 17.9–39.5)
TIBC: 291 ug/dL (ref 250–450)
UIBC: 118 ug/dL

## 2024-04-23 LAB — CBC WITH DIFFERENTIAL (CANCER CENTER ONLY)
Abs Immature Granulocytes: 0.02 K/uL (ref 0.00–0.07)
Basophils Absolute: 0 K/uL (ref 0.0–0.1)
Basophils Relative: 0 %
Eosinophils Absolute: 0.1 K/uL (ref 0.0–0.5)
Eosinophils Relative: 2 %
HCT: 42.7 % (ref 39.0–52.0)
Hemoglobin: 14.6 g/dL (ref 13.0–17.0)
Immature Granulocytes: 0 %
Lymphocytes Relative: 33 %
Lymphs Abs: 1.8 K/uL (ref 0.7–4.0)
MCH: 30.1 pg (ref 26.0–34.0)
MCHC: 34.2 g/dL (ref 30.0–36.0)
MCV: 88 fL (ref 80.0–100.0)
Monocytes Absolute: 0.5 K/uL (ref 0.1–1.0)
Monocytes Relative: 10 %
Neutro Abs: 2.9 K/uL (ref 1.7–7.7)
Neutrophils Relative %: 55 %
Platelet Count: 184 K/uL (ref 150–400)
RBC: 4.85 MIL/uL (ref 4.22–5.81)
RDW: 12.2 % (ref 11.5–15.5)
WBC Count: 5.3 K/uL (ref 4.0–10.5)
nRBC: 0 % (ref 0.0–0.2)

## 2024-04-23 LAB — FERRITIN: Ferritin: 56 ng/mL (ref 24–336)

## 2024-04-23 NOTE — Progress Notes (Signed)
 Hematology and Oncology Follow Up Visit  Randall Bradley [id number] 10/01/1955 68 y.o. 04/23/2024   Principle Diagnosis:  Hereditary hemochromatosis (C282Y Homozygous)  Current Therapy:   Phlebotomy/blood donation to maintain ferritin less than 50 and iron saturation less than 30%     Interim History:  Randall Bradley is back for follow-up.  He now lives up in [place] .  His house got done.  He has been in there for about 2 months.  I am so happy for him.  I know he is enjoying this a whole lot.  Everything is going fine with respect to his iron.  When we last saw him his ferritin was 22 and his iron saturation was 49%.  I think we may have phlebotomized him.  I told him that he could easily donate blood to the [facility].  However, he cannot because of that he said he had a hepatitis shot.  He has had no problems with cough or shortness of breath.  He has had no nausea or vomiting.  He has had no change in bowel or bladder habits.  He does have history of localized prostate cancer.  Overall, I would say that his performance status is probably ECOG 0.  Medications:  Current Outpatient Medications:    ampicillin (PRINCIPEN) 500 MG capsule, Take 500 mg by mouth daily., Disp: , Rfl:    aspirin EC 81 MG tablet, Take 81 mg by mouth daily., Disp: , Rfl:    azelastine  (ASTELIN ) 0.1 % nasal spray, Place 1 spray into both nostrils daily. Use in each nostril as directed, Disp: 30 mL, Rfl: 12   finasteride  (PROSCAR ) 5 MG tablet, Take 1 mg by mouth daily. Cut in 4 pieces, Disp: , Rfl:    hydrochlorothiazide (HYDRODIURIL) 12.5 MG tablet, Take 12.5 mg by mouth every morning., Disp: , Rfl:    irbesartan (AVAPRO) 150 MG tablet, Take 150 mg by mouth daily., Disp: , Rfl:    Multiple Vitamin (MULTIVITAMIN WITH MINERALS) TABS tablet, Take 1 tablet by mouth daily. Reported on 06/28/2015, Disp: , Rfl:   Allergies:  No Known Allergies    Past Medical History, Surgical history, Social history, and Family  History were reviewed and updated.  Review of Systems: Review of Systems  Constitutional: Negative.   HENT:  Negative.    Eyes: Negative.   Respiratory: Negative.    Cardiovascular: Negative.   Gastrointestinal: Negative.   Endocrine: Negative.   Genitourinary: Negative.    Musculoskeletal: Negative.   Skin: Negative.   Neurological: Negative.   Hematological: Negative.   Psychiatric/Behavioral: Negative.      Physical Exam:  weight is 190 lb (86.2 kg). His oral temperature is 98.1 F (36.7 C). His blood pressure is 110/83 and his pulse is 74. His respiration is 16 and oxygen saturation is 100%.   Wt Readings from Last 3 Encounters:  04/23/24 190 lb (86.2 kg)  02/10/24 190 lb (86.2 kg)  12/23/23 191 lb 3.2 oz (86.7 kg)    Physical Exam Vitals reviewed.  HENT:     Head: Normocephalic and atraumatic.  Eyes:     Pupils: Pupils are equal, round, and reactive to light.  Cardiovascular:     Rate and Rhythm: Normal rate and regular rhythm.     Heart sounds: Normal heart sounds.  Pulmonary:     Effort: Pulmonary effort is normal.     Breath sounds: Normal breath sounds.  Abdominal:     General: Bowel sounds are normal.  Palpations: Abdomen is soft.  Musculoskeletal:        General: No tenderness or deformity. Normal range of motion.     Cervical back: Normal range of motion.  Lymphadenopathy:     Cervical: No cervical adenopathy.  Skin:    General: Skin is warm and dry.     Findings: No erythema or rash.  Neurological:     Mental Status: He is alert and oriented to person, place, and time.  Psychiatric:        Behavior: Behavior normal.        Thought Content: Thought content normal.        Judgment: Judgment normal.      Lab Results  Component Value Date   WBC 5.3 04/23/2024   HGB 14.6 04/23/2024   HCT 42.7 04/23/2024   MCV 88.0 04/23/2024   PLT 184 04/23/2024     Chemistry      Component Value Date/Time   NA 139 04/23/2024 1140   NA 141  10/11/2020 0841   K 4.5 04/23/2024 1140   CL 101 04/23/2024 1140   CO2 28 04/23/2024 1140   BUN 26 (H) 04/23/2024 1140   BUN 21 10/11/2020 0841   CREATININE 1.11 04/23/2024 1140   CREATININE 1.05 03/22/2022 1020      Component Value Date/Time   CALCIUM  9.5 04/23/2024 1140   ALKPHOS 90 04/23/2024 1140   AST 22 04/23/2024 1140   ALT 18 04/23/2024 1140   BILITOT 0.5 04/23/2024 1140      Impression and Plan: Randall Bradley is a very nice 68 year old white male.  He has hereditary hemochromatosis.  He has the major mutation.  He is homozygous for this.    We will see what his iron studies look like.  Hopefully, we will not have to phlebotomize him.  We will still plan to get him back in 6 months.  Maude JONELLE Crease, MD 10/30/202512:29 PM

## 2024-04-24 ENCOUNTER — Ambulatory Visit: Payer: Self-pay | Admitting: Hematology & Oncology

## 2024-04-24 NOTE — Telephone Encounter (Signed)
 Advised via MyChart.

## 2024-04-24 NOTE — Telephone Encounter (Signed)
-----   Message from Maude JONELLE Crease sent at 04/24/2024  6:35 AM EDT ----- Please call and let him know that the iron level is actually too high.  He needs to come in for phlebotomy.  Please set this up in 1 or 2 weeks.  Thanks.  Jeralyn ----- Message ----- From: Rebecka, [facility] [place] Sent: 04/23/2024  11:55 AM EDT To: Maude JONELLE Crease, MD

## 2024-04-27 ENCOUNTER — Telehealth: Payer: Self-pay | Admitting: Hematology & Oncology

## 2024-04-27 NOTE — Telephone Encounter (Signed)
 Called to schedule phlebotomy per inbasket. LVM to return call for scheduling.

## 2024-05-07 ENCOUNTER — Inpatient Hospital Stay: Attending: Hematology & Oncology

## 2024-05-07 DIAGNOSIS — Z79899 Other long term (current) drug therapy: Secondary | ICD-10-CM | POA: Diagnosis not present

## 2024-05-07 NOTE — Progress Notes (Signed)
 Zachary SHAUNNA Carrel presents today for phlebotomy per MD orders. Phlebotomy procedure started at 1157 and ended at 1206. 507 grams removed via 16 gauge needle to left AC using phlebotomy kit. Patient observed for 20 minutes after procedure without any incident. Pt given snacks and drink while in clinic. Pt states he has tolerated procedure before and declined to stay for the full observation period.  Patient tolerated procedure well. Pt left ambulatory in no apparent distress. IV needle removed intact.

## 2024-05-07 NOTE — Patient Instructions (Signed)

## 2024-05-12 ENCOUNTER — Ambulatory Visit: Admitting: Internal Medicine

## 2024-10-21 ENCOUNTER — Inpatient Hospital Stay: Admitting: Hematology & Oncology

## 2024-10-21 ENCOUNTER — Inpatient Hospital Stay
